# Patient Record
Sex: Male | Born: 1975 | Hispanic: No | Marital: Single
Health system: Southern US, Community
[De-identification: ages and names within clinical notes are randomized; demographics above are authoritative.]

## PROBLEM LIST (undated history)

## (undated) DIAGNOSIS — I10 Essential (primary) hypertension: Secondary | ICD-10-CM

## (undated) DIAGNOSIS — N289 Disorder of kidney and ureter, unspecified: Secondary | ICD-10-CM

## (undated) DIAGNOSIS — N186 End stage renal disease: Secondary | ICD-10-CM

## (undated) DIAGNOSIS — I219 Acute myocardial infarction, unspecified: Secondary | ICD-10-CM

## (undated) DIAGNOSIS — Z992 Dependence on renal dialysis: Secondary | ICD-10-CM

---

## 2002-09-30 ENCOUNTER — Ambulatory Visit (HOSPITAL_COMMUNITY): Admission: RE | Admit: 2002-09-30 | Discharge: 2002-09-30 | Payer: Self-pay | Admitting: Cardiology

## 2002-10-29 ENCOUNTER — Encounter: Payer: Self-pay | Admitting: Nephrology

## 2002-10-29 ENCOUNTER — Inpatient Hospital Stay (HOSPITAL_COMMUNITY): Admission: EM | Admit: 2002-10-29 | Discharge: 2002-10-31 | Payer: Self-pay | Admitting: Nephrology

## 2002-10-30 ENCOUNTER — Encounter: Payer: Self-pay | Admitting: Nephrology

## 2004-03-02 ENCOUNTER — Ambulatory Visit: Payer: Self-pay | Admitting: Nephrology

## 2005-09-03 ENCOUNTER — Ambulatory Visit: Payer: Self-pay | Admitting: Nephrology

## 2008-08-14 ENCOUNTER — Ambulatory Visit: Payer: Self-pay

## 2014-07-24 ENCOUNTER — Other Ambulatory Visit
Admit: 2014-07-24 | Disposition: A | Discharge status: Home / Self Care | Payer: Self-pay | Attending: Nephrology | Admitting: Nephrology

## 2014-07-24 LAB — POTASSIUM: POTASSIUM: 3.6 mmol/L

## 2014-10-15 DIAGNOSIS — I10 Essential (primary) hypertension: Secondary | ICD-10-CM | POA: Insufficient documentation

## 2015-06-20 DIAGNOSIS — R7881 Bacteremia: Secondary | ICD-10-CM | POA: Insufficient documentation

## 2015-06-21 DIAGNOSIS — N186 End stage renal disease: Secondary | ICD-10-CM | POA: Insufficient documentation

## 2015-08-05 DIAGNOSIS — Z09 Encounter for follow-up examination after completed treatment for conditions other than malignant neoplasm: Secondary | ICD-10-CM | POA: Insufficient documentation

## 2016-02-27 ENCOUNTER — Other Ambulatory Visit
Admission: RE | Admit: 2016-02-27 | Discharge: 2016-02-27 | Disposition: A | Discharge status: Home / Self Care | Payer: Self-pay | Source: Ambulatory Visit | Attending: Internal Medicine | Admitting: Internal Medicine

## 2016-02-27 LAB — POTASSIUM: Potassium: 6.2 mmol/L — ABNORMAL HIGH (ref 3.5–5.1)

## 2016-07-07 ENCOUNTER — Other Ambulatory Visit
Admission: RE | Admit: 2016-07-07 | Discharge: 2016-07-07 | Disposition: A | Discharge status: Home / Self Care | Payer: Self-pay | Source: Other Acute Inpatient Hospital | Attending: Nephrology | Admitting: Nephrology

## 2016-07-07 DIAGNOSIS — N186 End stage renal disease: Secondary | ICD-10-CM | POA: Insufficient documentation

## 2016-07-07 DIAGNOSIS — E875 Hyperkalemia: Secondary | ICD-10-CM | POA: Insufficient documentation

## 2016-07-07 LAB — POTASSIUM: Potassium: 6 mmol/L — ABNORMAL HIGH (ref 3.5–5.1)

## 2016-12-24 ENCOUNTER — Other Ambulatory Visit
Admission: RE | Admit: 2016-12-24 | Discharge: 2016-12-24 | Disposition: A | Discharge status: Home / Self Care | Payer: Self-pay | Source: Other Acute Inpatient Hospital | Attending: Nephrology | Admitting: Nephrology

## 2016-12-24 LAB — HEMOGLOBIN: Hemoglobin: 16.4 g/dL (ref 13.0–18.0)

## 2018-08-30 ENCOUNTER — Inpatient Hospital Stay
Admit: 2018-08-30 | Discharge: 2018-08-30 | Disposition: A | Discharge status: Home / Self Care | Payer: Self-pay | Attending: Cardiology | Admitting: Cardiology

## 2018-08-30 ENCOUNTER — Encounter
Admission: EM | Disposition: A | Discharge status: Home / Self Care | Payer: Self-pay | Source: Home / Self Care | Attending: Internal Medicine

## 2018-08-30 ENCOUNTER — Emergency Department: Payer: Self-pay

## 2018-08-30 ENCOUNTER — Inpatient Hospital Stay
Admission: EM | Admit: 2018-08-30 | Discharge: 2018-09-01 | DRG: 280 | Disposition: A | Discharge status: Home / Self Care | Payer: Self-pay | Attending: Internal Medicine | Admitting: Internal Medicine

## 2018-08-30 DIAGNOSIS — Z1159 Encounter for screening for other viral diseases: Secondary | ICD-10-CM

## 2018-08-30 DIAGNOSIS — Z8249 Family history of ischemic heart disease and other diseases of the circulatory system: Secondary | ICD-10-CM

## 2018-08-30 DIAGNOSIS — Z992 Dependence on renal dialysis: Secondary | ICD-10-CM

## 2018-08-30 DIAGNOSIS — Z9119 Patient's noncompliance with other medical treatment and regimen: Secondary | ICD-10-CM

## 2018-08-30 DIAGNOSIS — I5022 Chronic systolic (congestive) heart failure: Secondary | ICD-10-CM | POA: Diagnosis present

## 2018-08-30 DIAGNOSIS — I2119 ST elevation (STEMI) myocardial infarction involving other coronary artery of inferior wall: Principal | ICD-10-CM | POA: Diagnosis present

## 2018-08-30 DIAGNOSIS — D631 Anemia in chronic kidney disease: Secondary | ICD-10-CM | POA: Diagnosis present

## 2018-08-30 DIAGNOSIS — I2510 Atherosclerotic heart disease of native coronary artery without angina pectoris: Secondary | ICD-10-CM | POA: Diagnosis present

## 2018-08-30 DIAGNOSIS — I132 Hypertensive heart and chronic kidney disease with heart failure and with stage 5 chronic kidney disease, or end stage renal disease: Secondary | ICD-10-CM | POA: Diagnosis present

## 2018-08-30 DIAGNOSIS — N186 End stage renal disease: Secondary | ICD-10-CM | POA: Diagnosis present

## 2018-08-30 DIAGNOSIS — I255 Ischemic cardiomyopathy: Secondary | ICD-10-CM | POA: Diagnosis present

## 2018-08-30 DIAGNOSIS — N2581 Secondary hyperparathyroidism of renal origin: Secondary | ICD-10-CM | POA: Diagnosis present

## 2018-08-30 HISTORY — DX: Disorder of kidney and ureter, unspecified: N28.9

## 2018-08-30 HISTORY — PX: CORONARY/GRAFT ACUTE MI REVASCULARIZATION: CATH118305

## 2018-08-30 HISTORY — DX: Essential (primary) hypertension: I10

## 2018-08-30 HISTORY — PX: LEFT HEART CATH AND CORONARY ANGIOGRAPHY: CATH118249

## 2018-08-30 LAB — BASIC METABOLIC PANEL
Anion gap: 18 — ABNORMAL HIGH (ref 5–15)
BUN: 72 mg/dL — ABNORMAL HIGH (ref 6–20)
CO2: 26 mmol/L (ref 22–32)
Calcium: 8 mg/dL — ABNORMAL LOW (ref 8.9–10.3)
Chloride: 94 mmol/L — ABNORMAL LOW (ref 98–111)
Creatinine, Ser: 14.61 mg/dL — ABNORMAL HIGH (ref 0.61–1.24)
GFR calc Af Amer: 4 mL/min — ABNORMAL LOW (ref >60)
GFR calc non Af Amer: 4 mL/min — ABNORMAL LOW (ref >60)
Glucose, Bld: 156 mg/dL — ABNORMAL HIGH (ref 70–99)
Potassium: 4.7 mmol/L (ref 3.5–5.1)
Sodium: 138 mmol/L (ref 135–145)

## 2018-08-30 LAB — CBC
HCT: 47.4 % (ref 39.0–52.0)
Hemoglobin: 15.6 g/dL (ref 13.0–17.0)
MCH: 30.1 pg (ref 26.0–34.0)
MCHC: 32.9 g/dL (ref 30.0–36.0)
MCV: 91.5 fL (ref 80.0–100.0)
Platelets: 152 10*3/uL (ref 150–400)
RBC: 5.18 MIL/uL (ref 4.22–5.81)
RDW: 13.9 % (ref 11.5–15.5)
WBC: 7.4 10*3/uL (ref 4.0–10.5)
nRBC: 0 % (ref 0.0–0.2)

## 2018-08-30 LAB — GLUCOSE, CAPILLARY: Glucose-Capillary: 84 mg/dL (ref 70–99)

## 2018-08-30 LAB — POCT ACTIVATED CLOTTING TIME: Activated Clotting Time: 362 seconds

## 2018-08-30 LAB — MRSA PCR SCREENING: MRSA by PCR: NEGATIVE

## 2018-08-30 LAB — TROPONIN I
Troponin I: 12.74 ng/mL (ref <0.03)
Troponin I: 15.74 ng/mL (ref <0.03)
Troponin I: 17.4 ng/mL (ref <0.03)

## 2018-08-30 LAB — PHOSPHORUS: Phosphorus: 4.5 mg/dL (ref 2.5–4.6)

## 2018-08-30 LAB — SARS CORONAVIRUS 2 BY RT PCR (HOSPITAL ORDER, PERFORMED IN ~~LOC~~ HOSPITAL LAB): SARS Coronavirus 2: NEGATIVE

## 2018-08-30 LAB — HEMOGLOBIN A1C
Hgb A1c MFr Bld: 5.8 % — ABNORMAL HIGH (ref 4.8–5.6)
Mean Plasma Glucose: 119.76 mg/dL

## 2018-08-30 SURGERY — CORONARY/GRAFT ACUTE MI REVASCULARIZATION
Anesthesia: Moderate Sedation

## 2018-08-30 MED ORDER — FENTANYL CITRATE (PF) 100 MCG/2ML IJ SOLN
INTRAMUSCULAR | Status: AC
  Filled 2018-08-30: qty 2

## 2018-08-30 MED ORDER — HEPARIN SODIUM (PORCINE) 1000 UNIT/ML IJ SOLN
INTRAMUSCULAR | Status: DC | PRN
  Administered 2018-08-30: 7000 [IU] via INTRAVENOUS

## 2018-08-30 MED ORDER — HYDRALAZINE HCL 20 MG/ML IJ SOLN
10.0000 mg | INTRAMUSCULAR | Status: DC | PRN
  Administered 2018-08-30: 10:00:00 10 mg via INTRAVENOUS
  Filled 2018-08-30: qty 1

## 2018-08-30 MED ORDER — PENTAFLUOROPROP-TETRAFLUOROETH EX AERO
1.0000 "application " | INHALATION_SPRAY | CUTANEOUS | Status: DC | PRN
  Filled 2018-08-30: qty 30

## 2018-08-30 MED ORDER — SODIUM CHLORIDE 0.9 % IV SOLN: 250.0000 mL | INTRAVENOUS | Status: DC | PRN

## 2018-08-30 MED ORDER — METOPROLOL TARTRATE 50 MG PO TABS
50.0000 mg | ORAL_TABLET | Freq: Two times a day (BID) | ORAL | Status: DC
  Administered 2018-08-30 – 2018-08-31 (×4): 50 mg via ORAL
  Filled 2018-08-30 (×5): qty 1

## 2018-08-30 MED ORDER — HEPARIN SODIUM (PORCINE) 1000 UNIT/ML DIALYSIS
1000.0000 [IU] | INTRAMUSCULAR | Status: DC | PRN
  Filled 2018-08-30: qty 1

## 2018-08-30 MED ORDER — ALTEPLASE 2 MG IJ SOLR: 2.0000 mg | Freq: Once | INTRAMUSCULAR | Status: DC | PRN

## 2018-08-30 MED ORDER — ONDANSETRON HCL 4 MG/2ML IJ SOLN: 4.0000 mg | Freq: Four times a day (QID) | INTRAMUSCULAR | Status: DC | PRN

## 2018-08-30 MED ORDER — METOPROLOL TARTRATE 5 MG/5ML IV SOLN
INTRAVENOUS | Status: DC | PRN
  Administered 2018-08-30 (×2): 2.5 mg via INTRAVENOUS

## 2018-08-30 MED ORDER — ASPIRIN 81 MG PO CHEW: 324.0000 mg | CHEWABLE_TABLET | Freq: Once | ORAL | Status: DC

## 2018-08-30 MED ORDER — VERAPAMIL HCL 2.5 MG/ML IV SOLN
INTRAVENOUS | Status: DC | PRN
  Administered 2018-08-30: 2.5 mg via INTRA_ARTERIAL

## 2018-08-30 MED ORDER — ASPIRIN 81 MG PO CHEW
81.0000 mg | CHEWABLE_TABLET | Freq: Every day | ORAL | Status: DC
  Administered 2018-08-30 – 2018-08-31 (×2): 81 mg via ORAL
  Filled 2018-08-30 (×3): qty 1

## 2018-08-30 MED ORDER — NITROGLYCERIN 5 MG/ML IV SOLN
INTRAVENOUS | Status: AC
  Filled 2018-08-30: qty 10

## 2018-08-30 MED ORDER — CHLORHEXIDINE GLUCONATE CLOTH 2 % EX PADS
6.0000 | MEDICATED_PAD | Freq: Every day | CUTANEOUS | Status: DC
  Administered 2018-08-30 – 2018-08-31 (×2): 6 via TOPICAL

## 2018-08-30 MED ORDER — SODIUM CHLORIDE 0.9% FLUSH: 3.0000 mL | INTRAVENOUS | Status: DC | PRN

## 2018-08-30 MED ORDER — MIDAZOLAM HCL 5 MG/5ML IJ SOLN
INTRAMUSCULAR | Status: AC
  Filled 2018-08-30: qty 5

## 2018-08-30 MED ORDER — FENTANYL CITRATE (PF) 100 MCG/2ML IJ SOLN
INTRAMUSCULAR | Status: DC | PRN
  Administered 2018-08-30 (×2): 25 ug via INTRAVENOUS

## 2018-08-30 MED ORDER — SODIUM CHLORIDE 0.9% FLUSH
3.0000 mL | Freq: Two times a day (BID) | INTRAVENOUS | Status: DC
  Administered 2018-08-30 – 2018-08-31 (×3): 3 mL via INTRAVENOUS

## 2018-08-30 MED ORDER — LISINOPRIL 5 MG PO TABS
5.0000 mg | ORAL_TABLET | Freq: Every day | ORAL | Status: DC
  Administered 2018-08-30: 5 mg via ORAL
  Filled 2018-08-30: qty 1

## 2018-08-30 MED ORDER — TICAGRELOR 90 MG PO TABS
ORAL_TABLET | ORAL | Status: DC | PRN
  Administered 2018-08-30: 180 mg via ORAL

## 2018-08-30 MED ORDER — ATORVASTATIN CALCIUM 20 MG PO TABS
80.0000 mg | ORAL_TABLET | Freq: Every day | ORAL | Status: DC
  Administered 2018-08-30 – 2018-08-31 (×2): 80 mg via ORAL
  Filled 2018-08-30: qty 1
  Filled 2018-08-30: qty 4
  Filled 2018-08-30: qty 1
  Filled 2018-08-30: qty 4

## 2018-08-30 MED ORDER — HEPARIN SODIUM (PORCINE) 5000 UNIT/ML IJ SOLN
5000.0000 [IU] | Freq: Three times a day (TID) | INTRAMUSCULAR | Status: DC
  Administered 2018-08-30 – 2018-09-01 (×6): 5000 [IU] via SUBCUTANEOUS
  Filled 2018-08-30 (×6): qty 1

## 2018-08-30 MED ORDER — METOPROLOL TARTRATE 5 MG/5ML IV SOLN
INTRAVENOUS | Status: AC
  Filled 2018-08-30: qty 5

## 2018-08-30 MED ORDER — TICAGRELOR 90 MG PO TABS
90.0000 mg | ORAL_TABLET | Freq: Two times a day (BID) | ORAL | Status: DC
  Administered 2018-08-30 – 2018-08-31 (×3): 90 mg via ORAL
  Filled 2018-08-30 (×3): qty 1

## 2018-08-30 MED ORDER — IOHEXOL 300 MG/ML  SOLN
INTRAMUSCULAR | Status: DC | PRN
  Administered 2018-08-30: 200 mL via INTRAVENOUS

## 2018-08-30 MED ORDER — NITROGLYCERIN IN D5W 200-5 MCG/ML-% IV SOLN: 0.0000 ug/min | INTRAVENOUS | Status: DC

## 2018-08-30 MED ORDER — SODIUM CHLORIDE 0.9 % IV SOLN: 100.0000 mL | INTRAVENOUS | Status: DC | PRN

## 2018-08-30 MED ORDER — LIDOCAINE-PRILOCAINE 2.5-2.5 % EX CREA
1.0000 "application " | TOPICAL_CREAM | CUTANEOUS | Status: DC | PRN
  Filled 2018-08-30: qty 5

## 2018-08-30 MED ORDER — VERAPAMIL HCL 2.5 MG/ML IV SOLN
INTRAVENOUS | Status: AC
  Filled 2018-08-30: qty 2

## 2018-08-30 MED ORDER — ACETAMINOPHEN 325 MG PO TABS: 650.0000 mg | ORAL_TABLET | ORAL | Status: DC | PRN

## 2018-08-30 MED ORDER — LIDOCAINE HCL (PF) 1 % IJ SOLN
5.0000 mL | INTRAMUSCULAR | Status: DC | PRN
  Filled 2018-08-30: qty 5

## 2018-08-30 MED ORDER — MIDAZOLAM HCL 2 MG/2ML IJ SOLN
INTRAMUSCULAR | Status: DC | PRN
  Administered 2018-08-30 (×2): 1 mg via INTRAVENOUS

## 2018-08-30 MED ORDER — SODIUM CHLORIDE 0.9 % WEIGHT BASED INFUSION: 1.0000 mL/kg/h | INTRAVENOUS | Status: AC

## 2018-08-30 MED ORDER — LABETALOL HCL 5 MG/ML IV SOLN: 10.0000 mg | INTRAVENOUS | Status: DC | PRN

## 2018-08-30 SURGICAL SUPPLY — 16 items
CATH INFINITI 5FR ANG PIGTAIL (CATHETERS) ×3 IMPLANT
CATH INFINITI 5FR JL4 (CATHETERS) ×3 IMPLANT
CATH LAUNCHER 6FR JR4 (CATHETERS) ×3 IMPLANT
DEVICE INFLAT 30 PLUS (MISCELLANEOUS) ×3 IMPLANT
DEVICE RAD TR BAND REGULAR (VASCULAR PRODUCTS) ×3 IMPLANT
GLIDESHEATH SLEND SS 6F .021 (SHEATH) ×3 IMPLANT
KIT MANI 3VAL PERCEP (MISCELLANEOUS) ×3 IMPLANT
NEEDLE PERC 18GX7CM (NEEDLE) IMPLANT
PACK CARDIAC CATH (CUSTOM PROCEDURE TRAY) ×3 IMPLANT
PROTECTION STATION PRESSURIZED (MISCELLANEOUS) ×3
SHEATH AVANTI 6FR X 11CM (SHEATH) IMPLANT
STATION PROTECTION PRESSURIZED (MISCELLANEOUS) ×1 IMPLANT
WIRE ASAHI PROWATER 180CM (WIRE) ×3 IMPLANT
WIRE GUIDERIGHT .035X150 (WIRE) IMPLANT
WIRE HITORQ VERSACORE ST 145CM (WIRE) ×3 IMPLANT
WIRE ROSEN-J .035X260CM (WIRE) ×3 IMPLANT

## 2018-08-30 NOTE — Progress Notes (Signed)
HD Tx Start    08/30/18 1100  Vital Signs  Pulse Rate 70  Resp (!) 26  BP (!) 155/98  Oxygen Therapy  SpO2 98 %  During Hemodialysis Assessment  Blood Flow Rate (mL/min) 200 mL/min  Arterial Pressure (mmHg) -40 mmHg  Venous Pressure (mmHg) 80 mmHg  Transmembrane Pressure (mmHg) 50 mmHg  Ultrafiltration Rate (mL/min) 620 mL/min (658mL removal per HOUR)  Dialysate Flow Rate (mL/min) 800 ml/min  Conductivity: Machine  14  HD Safety Checks Performed Yes  Dialysis Fluid Bolus Normal Saline  Bolus Amount (mL) 250 mL  Intra-Hemodialysis Comments Tx initiated (BFR increase slowly d/t EF(30-35%)&recent STEMI)

## 2018-08-30 NOTE — Progress Notes (Signed)
Notified Amanda Morrison, Dialysis liaison with patient pathways of admission.   

## 2018-08-30 NOTE — ED Notes (Signed)
Dr. Saralyn Pilar at bedside along with interpreter

## 2018-08-30 NOTE — ED Triage Notes (Signed)
Patient 43 yo male coming from dialysis by [facility] complaining of chest pain x 3 days. Patient is spanish speaking with little english. Patient states having chest pain x 3 days that feels like pressure with numbness going down left arm. Patient was hypertensive with EMS BP 160/100 HR 90. Per EMS gave chewable aspirin in route 324 mg.

## 2018-08-30 NOTE — Progress Notes (Signed)
*  PRELIMINARY RESULTS* Echocardiogram 2D Echocardiogram has been performed.  Sherrie Sport 08/30/2018, 2:40 PM

## 2018-08-30 NOTE — Progress Notes (Signed)
Established patient at [facility] 5:45am , no changes in patient chair time at this moment. Continue to follow.

## 2018-08-30 NOTE — Consult Note (Signed)
[facility] CONSULT NOTE    Date: 08/30/2018                  Patient Name:  Ralph Lucero  MRN: [id number]  DOB: 1976/02/05  Age / Sex: 43 y.o., male         PCP: Anthonette Legato, MD                 Service Requesting Consult:  Hospitalist                 Reason for Consult:  Management of end-stage renal disease.            History of Present Illness: Patient is a 43 y.o. male with a PMHx of end-stage renal disease on hemodialysis, hypertension, anemia of chronic kidney disease, secondary hyperparathyroidism, who was admitted to [facility] on 08/30/2018 for evaluation of chest pain.  Upon being brought here he was found to have ischemic changes in the inferolateral leads.  His initial troponin was 12.  He was urgently taken to the cardiac catheterization lab.  He was found to have a 100% stenosis posterior descending artery.  We saw the patient in the intensive care unit.  His chest pain was improving at that time.  Patient due for hemodialysis today per his usual schedule.  He is also found to have significant ischemic cardiomyopathy with an ejection fraction of 30 to 35%.   Medications: Outpatient medications: No medications prior to admission.    Current medications: Current Facility-Administered Medications  Medication Dose Route Frequency Provider Last Rate Last Dose  . 0.9 %  sodium chloride infusion  250 mL Intravenous PRN Paraschos, Alexander, MD      . 0.9 %  sodium chloride infusion  100 mL Intravenous PRN Juel Ripley, MD      . 0.9 %  sodium chloride infusion  100 mL Intravenous PRN Shyvonne Chastang, MD      . 0.9% sodium chloride infusion  1 mL/kg/hr Intravenous Continuous Paraschos, Alexander, MD      . acetaminophen (TYLENOL) tablet 650 mg  650 mg Oral Q4H PRN Paraschos, Alexander, MD      . alteplase (CATHFLO ACTIVASE) injection 2 mg  2 mg Intracatheter Once PRN Seven Marengo, MD      . aspirin chewable tablet 324 mg  324 mg Oral Once Gregor Hams, MD      . aspirin chewable tablet 81 mg  81 mg Oral Daily Paraschos, Alexander, MD   81 mg at 08/30/18 1000  . atorvastatin (LIPITOR) tablet 80 mg  80 mg Oral q1800 Paraschos, Alexander, MD      . Chlorhexidine Gluconate Cloth 2 % PADS 6 each  6 each Topical Q0600 Holley Raring, Mahlani Berninger, MD   6 each at 08/30/18 1000  . fentaNYL (SUBLIMAZE) 100 MCG/2ML injection           . heparin injection 1,000 Units  1,000 Units Dialysis PRN Sharyn Brilliant, MD      . heparin injection 5,000 Units  5,000 Units Subcutaneous Q8H Kasa, Kurian, MD      . hydrALAZINE (APRESOLINE) injection 10 mg  10 mg Intravenous Q20 Min PRN Paraschos, Alexander, MD   10 mg at 08/30/18 1000  . labetalol (NORMODYNE) injection 10 mg  10 mg Intravenous Q10 min PRN Paraschos, Alexander, MD      . lidocaine (PF) (XYLOCAINE) 1 % injection 5 mL  5 mL Intradermal PRN Holley Raring, Agustina Witzke, MD      .  lidocaine-prilocaine (EMLA) cream 1 application  1 application Topical PRN Jamonte Curfman, MD      . lisinopril (ZESTRIL) tablet 5 mg  5 mg Oral Daily Paraschos, Alexander, MD   5 mg at 08/30/18 1000  . metoprolol tartrate (LOPRESSOR) 5 MG/5ML injection           . metoprolol tartrate (LOPRESSOR) tablet 50 mg  50 mg Oral BID Paraschos, Alexander, MD   50 mg at 08/30/18 1000  . midazolam (VERSED) 5 MG/5ML injection           . nitroGLYCERIN 5 MG/ML injection           . nitroGLYCERIN 50 mg in dextrose 5 % 250 mL (0.2 mg/mL) infusion  0-200 mcg/min Intravenous Titrated Paraschos, Alexander, MD      . ondansetron (ZOFRAN) injection 4 mg  4 mg Intravenous Q6H PRN Paraschos, Alexander, MD      . pentafluoroprop-tetrafluoroeth (GEBAUERS) aerosol 1 application  1 application Topical PRN Aleric Froelich, MD      . sodium chloride flush (NS) 0.9 % injection 3 mL  3 mL Intravenous Q12H Paraschos, Alexander, MD      . sodium chloride flush (NS) 0.9 % injection 3 mL  3 mL Intravenous PRN Paraschos, Alexander, MD      . ticagrelor (BRILINTA) tablet 90 mg   90 mg Oral BID Paraschos, Alexander, MD      . verapamil (ISOPTIN) 2.5 MG/ML injection               Allergies: Not on File    Past Medical History: Past Medical History:  Diagnosis Date  . Hypertension   . Renal disorder      Past Surgical History: Past Surgical History:  Procedure Laterality Date  . CORONARY/GRAFT ACUTE MI REVASCULARIZATION N/A 08/30/2018   Procedure: Coronary/Graft Acute MI Revascularization;  Surgeon: Isaias Cowman, MD;  Location: [place] [facility];  Service: Cardiovascular;  Laterality: N/A;  . LEFT HEART CATH AND CORONARY ANGIOGRAPHY N/A 08/30/2018   Procedure: LEFT HEART CATH AND CORONARY ANGIOGRAPHY;  Surgeon: Isaias Cowman, MD;  Location: [place] [facility];  Service: Cardiovascular;  Laterality: N/A;     Family History: History reviewed. No pertinent family history.   Social History: Social History   Socioeconomic History  . Marital status: Single    Spouse name: Not on file  . Number of children: Not on file  . Years of education: Not on file  . Highest education level: Not on file  Occupational History  . Not on file  Social Needs  . Financial resource strain: Not on file  . Food insecurity:    Worry: Not on file    Inability: Not on file  . Transportation needs:    Medical: Not on file    Non-medical: Not on file  Tobacco Use  . Smoking status: Never Smoker  . Smokeless tobacco: Never Used  Substance and Sexual Activity  . Alcohol use: Never    Frequency: Never  . Drug use: Never  . Sexual activity: Not on file  Lifestyle  . Physical activity:    Days per week: Not on file    Minutes per session: Not on file  . Stress: Not on file  Relationships  . Social connections:    Talks on phone: Not on file    Gets together: Not on file    Attends religious service: Not on file    Active member of club or organization: Not on file  Attends meetings of clubs or organizations: Not on file    Relationship  status: Not on file  . Intimate partner violence:    Fear of current or ex partner: Not on file    Emotionally abused: Not on file    Physically abused: Not on file    Forced sexual activity: Not on file  Other Topics Concern  . Not on file  Social History Narrative  . Not on file     Review of Systems: Review of Systems  Constitutional: Negative for chills, fever and malaise/fatigue.  HENT: Negative for congestion, hearing loss and tinnitus.   Eyes: Negative for blurred vision and double vision.  Respiratory: Negative for cough, hemoptysis and sputum production.   Cardiovascular: Positive for chest pain. Negative for orthopnea and leg swelling.  Gastrointestinal: Negative for heartburn, nausea and vomiting.  Genitourinary: Negative for dysuria and urgency.  Musculoskeletal: Negative for myalgias and neck pain.  Skin: Negative for itching and rash.  Neurological: Negative for dizziness and weakness.  Endo/Heme/Allergies: Negative for polydipsia. Does not bruise/bleed easily.  Psychiatric/Behavioral: Negative for depression. The patient is not nervous/anxious.      Vital Signs: Blood pressure (!) 151/97, pulse 72, temperature 98.2 F (36.8 C), temperature source Oral, resp. rate 17, height 5\' 9"  (1.753 m), weight 54.5 kg, SpO2 99 %.  Weight trends: Filed Weights   08/30/18 0658 08/30/18 1045 08/30/18 1430  Weight: 56 kg 56 kg 54.5 kg    Physical Exam: General: NAD, resting comfortably in bed  Head: Normocephalic, atraumatic.  Eyes: Anicteric, EOMI  Nose: Mucous membranes moist, not inflammed, nonerythematous.  Throat: Oropharynx nonerythematous, no exudate appreciated.   Neck: Supple, trachea midline.  Lungs:  Normal respiratory effort. Clear to auscultation BL without crackles or wheezes.  Heart: RRR. S1 and S2 normal without gallop, murmur, or rubs.  Abdomen:  BS normoactive. Soft, Nondistended, non-tender.  No masses or organomegaly.  Extremities: No pretibial  edema.  Neurologic: A&O X3, Motor strength is 5/5 in the all 4 extremities  Skin: No visible rashes, scars.    Lab results: Basic Metabolic Panel: Recent Labs  Lab 08/30/18 0701 08/30/18 1120  NA 138  --   K 4.7  --   CL 94*  --   CO2 26  --   GLUCOSE 156*  --   BUN 72*  --   CREATININE 14.61*  --   CALCIUM 8.0*  --   PHOS  --  4.5    Liver Function Tests: No results for input(s): AST, ALT, ALKPHOS, BILITOT, PROT, ALBUMIN in the last 168 hours. No results for input(s): LIPASE, AMYLASE in the last 168 hours. No results for input(s): AMMONIA in the last 168 hours.  CBC: Recent Labs  Lab 08/30/18 0701  WBC 7.4  HGB 15.6  HCT 47.4  MCV 91.5  PLT 152    Cardiac Enzymes: Recent Labs  Lab 08/30/18 0701 08/30/18 1145  TROPONINI 12.74* 15.74*    BNP: Invalid input(s): POCBNP  CBG: Recent Labs  Lab 08/30/18 0920  GLUCAP 66    Microbiology: Results for orders placed or performed during the hospital encounter of 08/30/18  SARS Coronavirus 2 (CEPHEID - Performed in [place] hospital lab), Hosp Order     Status: None   Collection Time: 08/30/18  7:02 AM  Result Value Ref Range Status   SARS Coronavirus 2 NEGATIVE NEGATIVE Final    Comment: (NOTE) If result is NEGATIVE SARS-CoV-2 target nucleic acids are NOT DETECTED. The SARS-CoV-2 RNA  is generally detectable in upper and lower  respiratory specimens during the acute phase of infection. The lowest  concentration of SARS-CoV-2 viral copies this assay can detect is 250  copies / mL. A negative result does not preclude SARS-CoV-2 infection  and should not be used as the sole basis for treatment or other  patient management decisions.  A negative result may occur with  improper specimen collection / handling, submission of specimen other  than nasopharyngeal swab, presence of viral mutation(s) within the  areas targeted by this assay, and inadequate number of viral copies  (<250 copies / mL). A negative  result must be combined with clinical  observations, patient history, and epidemiological information. If result is POSITIVE SARS-CoV-2 target nucleic acids are DETECTED. The SARS-CoV-2 RNA is generally detectable in upper and lower  respiratory specimens dur ing the acute phase of infection.  Positive  results are indicative of active infection with SARS-CoV-2.  Clinical  correlation with patient history and other diagnostic information is  necessary to determine patient infection status.  Positive results do  not rule out bacterial infection or co-infection with other viruses. If result is PRESUMPTIVE POSTIVE SARS-CoV-2 nucleic acids MAY BE PRESENT.   A presumptive positive result was obtained on the submitted specimen  and confirmed on repeat testing.  While 2019 novel coronavirus  (SARS-CoV-2) nucleic acids may be present in the submitted sample  additional confirmatory testing may be necessary for epidemiological  and / or clinical management purposes  to differentiate between  SARS-CoV-2 and other Sarbecovirus currently known to infect humans.  If clinically indicated additional testing with an alternate test  methodology [phone]) is advised. The SARS-CoV-2 RNA is generally  detectable in upper and lower respiratory sp ecimens during the acute  phase of infection. The expected result is Negative. Fact Sheet for Patients:  StrictlyIdeas.no Fact Sheet for Healthcare Providers: BankingDealers.co.za This test is not yet approved or cleared by the [place] FDA and has been authorized for detection and/or diagnosis of SARS-CoV-2 by FDA under an Emergency Use Authorization (EUA).  This EUA will remain in effect (meaning this test can be used) for the duration of the COVID-19 declaration under Section 564(b)(1) of the Act, 21 U.S.C. section 360bbb-3(b)(1), unless the authorization is terminated or revoked sooner. Performed at  [facility], [address] [place], [place] [address]   MRSA PCR Screening     Status: None   Collection Time: 08/30/18  9:34 AM  Result Value Ref Range Status   MRSA by PCR NEGATIVE NEGATIVE Final    Comment:        The GeneXpert MRSA Assay (FDA approved for NASAL specimens only), is one component of a comprehensive MRSA colonization surveillance program. It is not intended to diagnose MRSA infection nor to guide or monitor treatment for MRSA infections. Performed at [facility], [address] [place], [place] [address]     Coagulation Studies: No results for input(s): LABPROT, INR in the last 72 hours.  Urinalysis: No results for input(s): COLORURINE, LABSPEC, PHURINE, GLUCOSEU, HGBUR, BILIRUBINUR, KETONESUR, PROTEINUR, UROBILINOGEN, NITRITE, LEUKOCYTESUR in the last 72 hours.  Invalid input(s): APPERANCEUR    Imaging: Dg Chest Port 1 View  Result Date: 08/30/2018 CLINICAL DATA:  Chest pain EXAM: PORTABLE CHEST 1 VIEW COMPARISON:  None. FINDINGS: Cardiac shadow is mildly enlarged. The lungs are well aerated bilaterally. No focal infiltrate or sizable effusion is seen. No acute bony abnormality is noted. IMPRESSION: No active disease. Electronically Signed  By: Inez Catalina M.D.   On: 08/30/2018 07:13      Assessment & Plan: Pt is a 43 y.o. male with a PMHx of end-stage renal disease on hemodialysis, hypertension, anemia of chronic kidney disease, secondary hyperparathyroidism, who was admitted to [facility] on 08/30/2018 for evaluation of chest pain.  Patient found to have inferolateral myocardial infarction and PCI was attempted however unsuccessful.  Suspected lesion was in the mid posterior descending artery.  1.  ESRD on HD TTS.  Patient due for hemodialysis today.  We will plan for conservative ultrafiltration target of 1.5 kg.  2.  Coronary artery disease with lesion in mid posterior descending artery unamenable to PCI/chronic systolic heart  failure ejection fraction 30 to 35%.  PCI attempted but unsuccessful.  Cardiology recommends medical management with dual antiplatelet therapy.  3.  Secondary hyperparathyroidism.  Serum phosphorus currently 4.5 and acceptable.  Continue to monitor bone mineral metabolism parameters.  4.  Hypertension.  Maintain the patient on hydralazine, labetalol, for now.  Consider adding an ARB post discharge.  5.  Thanks for consultation.

## 2018-08-30 NOTE — Progress Notes (Signed)
Post HD Tx  Removed 1.5 liters during tx, tolerated well. Currently denies increased chest pain (still 2/10, as it was pre-tx).      08/30/18 1445  Hand-Off documentation  Report given to (Full Name) Leeanne Deed RN  Report received from (Full Name) Trellis Paganini RN  Vital Signs  Pulse Rate 72  Resp 17  BP (!) 151/97  Oxygen Therapy  SpO2 99 %  Post-Hemodialysis Assessment  Rinseback Volume (mL) 250 mL  Dialyzer Clearance Lightly streaked  Duration of HD Treatment -hour(s) 3.5 hour(s)  Hemodialysis Intake (mL) 500 mL  UF Total -Machine (mL) 2000 mL  Net UF (mL) 1500 mL  Tolerated HD Treatment Yes  AVG/AVF Arterial Site Held (minutes) 10 minutes  AVG/AVF Venous Site Held (minutes) 10 minutes

## 2018-08-30 NOTE — Progress Notes (Signed)
Pre HD Assessment    08/30/18 1100  Neurological  Level of Consciousness Alert  Orientation Level Oriented X4  Respiratory  Respiratory Pattern Regular;Irregular  Chest Assessment Chest expansion symmetrical  Bilateral Breath Sounds Diminished  Cardiac  Pulse Regular  ECG Monitor Yes  Cardiac Rhythm NSR  Vascular  R Radial Pulse +2  L Radial Pulse +2  Edema Generalized  Integumentary  Integumentary (WDL) X  Skin Color Appropriate for ethnicity  Skin Condition Dry  Skin Integrity  (scabbing at AVF from cannulation in 1spot.rotate site today)  Additional Integumentary Comments  (HD pt - left lower AVF, aneurisms present)  Musculoskeletal  Musculoskeletal (WDL) WDL  Gastrointestinal  Bowel Sounds Assessment Active  GU Assessment  Genitourinary (WDL) X  Genitourinary Symptoms Anuria (HD pt 11+ years)  Psychosocial  Psychosocial (WDL) WDL

## 2018-08-30 NOTE — Progress Notes (Signed)
HD Tx End    08/30/18 1430  Vital Signs  Temp 98.2 F (36.8 C)  Temp Source Oral  Pulse Rate 67  Pulse Rate Source Monitor  Resp 17  BP (!) 168/99  BP Location Right Leg  BP Method Automatic  Patient Position (if appropriate) Lying  Oxygen Therapy  SpO2 98 %  O2 Device Room Air  Pain Assessment  Pain Scale 0-10  Pain Score 2  Pain Type Acute pain  Pain Location Chest (same as pre tx)  Dialysis Weight  Weight 54.5 kg  Type of Weight Post-Dialysis  Fistula / Graft Left Forearm Arteriovenous fistula  No Placement Date or Time found.   Placed prior to admission: Yes  Orientation: Left  Access Location: Forearm  Access Type: Arteriovenous fistula  Site Condition No complications;Other (Comment)  Fistula / Graft Assessment Present;Thrill;Bruit;Aneurism present  Status Deaccessed  Drainage Description None

## 2018-08-30 NOTE — ED Provider Notes (Signed)
[facility] Emergency Department Provider Note ______________________________   First MD Initiated Contact with Patient 08/30/18 [phone]     (approximate)  I have reviewed the triage vital signs and the nursing notes.   HISTORY  Chief Complaint Chest Pain    HPI Ralph Lucero is a 43 y.o. male with history of hypertension and end-stage renal disease requiring dialysis presents to the emergency department via EMS from dialysis with progressively worsening chest pain which patient states began on Tuesday with maximum discomfort this morning.  Patient states that the pain is on the left side of the chest and radiates to the left arm.  Patient denies any dyspnea.  Patient denies any lower extremity pain or swelling.  Or vomiting.        Past Medical History:  Diagnosis Date  . Hypertension   . Renal disorder     There are no active problems to display for this patient.   History reviewed. No pertinent surgical history.  Prior to Admission medications   Not on File    Allergies Patient has no allergy information on record.  History reviewed. No pertinent family history.  Social History Social History   Tobacco Use  . Smoking status: Never Smoker  . Smokeless tobacco: Never Used  Substance Use Topics  . Alcohol use: Never    Frequency: Never  . Drug use: Never    Review of Systems Constitutional: No fever/chills Eyes: No visual changes. ENT: No sore throat. Cardiovascular: Positive for chest pain. Respiratory: Denies shortness of breath. Gastrointestinal: No abdominal pain.  No nausea, no vomiting.  No diarrhea.  No constipation. Genitourinary: Negative for dysuria. Musculoskeletal: Negative for neck pain.  Negative for back pain. Integumentary: Negative for rash. Neurological: Negative for headaches, focal weakness or numbness.   ____________________________________________   PHYSICAL EXAM:  VITAL SIGNS: ED Triage Vitals   Enc Vitals Group     BP 08/30/18 0651 (!) 170/130     Pulse Rate 08/30/18 0651 85     Resp 08/30/18 0651 18     Temp 08/30/18 0651 98.7 F (37.1 C)     Temp Source 08/30/18 0651 Oral     SpO2 08/30/18 0649 100 %     Weight 08/30/18 0658 56 kg (123 lb 7.3 oz)     Height 08/30/18 0658 1.753 m (5\' 9" )     Head Circumference --      Peak Flow --      Pain Score 08/30/18 0652 2     Pain Loc --      Pain Edu? --      Excl. in [place]? --     Constitutional: Alert and oriented. Well appearing and in no acute distress. Eyes: Conjunctivae are normal. Mouth/Throat: Mucous membranes are moist.  Oropharynx non-erythematous. Neck: No stridor.   Cardiovascular: Normal rate, regular rhythm. Good peripheral circulation. Grossly normal heart sounds. Respiratory: Normal respiratory effort.  No retractions. No audible wheezing. Gastrointestinal: Soft and nontender. No distention.  Musculoskeletal: No lower extremity tenderness nor edema. No gross deformities of extremities. Neurologic:  Normal speech and language. No gross focal neurologic deficits are appreciated.  Skin:  Skin is warm, dry and intact. No rash noted.   ____________________________________________   LABS (all labs ordered are listed, but only abnormal results are displayed)  Labs Reviewed  BASIC METABOLIC PANEL  CBC  TROPONIN I   ____________________________________________  EKG  ED ECG REPORT I, [place], the attending physician, personally viewed and interpreted  this ECG.   Date: 08/30/2018  EKG Time: 6:48 AM  Rate: 84  Rhythm: Normal sinus rhythm  Axis: Normal  Intervals: Normal  ST&T Change: Inferior ST segment elevation to 3 and aVF.  Also concern for posterior given ST segment depression V1 and V2  ____________________________________________   ___________________________________  .Critical Care Performed by: Gregor Hams, MD Authorized by: Gregor Hams, MD   Critical care provider  statement:    Critical care time (minutes):  20   Critical care time was exclusive of:  Separately billable procedures and treating other patients (STEMI)   Critical care was time spent personally by me on the following activities:  Development of treatment plan with patient or surrogate, discussions with consultants, evaluation of patient's response to treatment, examination of patient, obtaining history from patient or surrogate, ordering and performing treatments and interventions, ordering and review of laboratory studies, ordering and review of radiographic studies, pulse oximetry, re-evaluation of patient's condition and review of old charts   I assumed direction of critical care for this patient from another provider in my specialty: no       ____________________________________________   INITIAL IMPRESSION / MDM / [place] / ED COURSE  As part of my medical decision making, I reviewed the following data within the electronic MEDICAL RECORD NUMBER   43 year old male presenting with above-stated history and physical exam concerning for possible area/posterior MI.  STEMI activated patient discussed with Dr. Saralyn Pilar    *[name] was evaluated in Emergency Department on 08/30/2018 for the symptoms described in the history of present illness. He was evaluated in the context of the global COVID-19 pandemic, which necessitated consideration that the patient might be at risk for infection with the SARS-CoV-2 virus that causes COVID-19. Institutional protocols and algorithms that pertain to the evaluation of patients at risk for COVID-19 are in a state of rapid change based on information released by regulatory bodies including the CDC and federal and state organizations. These policies and algorithms were followed during the patient's care in the ED.  Some ED evaluations and interventions may be delayed as a result of limited staffing during the pandemic.*     ____________________________________________  FINAL CLINICAL IMPRESSION(S) / ED DIAGNOSES  Final diagnoses:  Acute ST elevation myocardial infarction (STEMI) involving other coronary artery of inferior wall (HCC)     MEDICATIONS GIVEN DURING THIS VISIT:  Medications - No data to display   ED Discharge Orders    None       Note:  This document was prepared using Dragon voice recognition software and may include unintentional dictation errors.   Gregor Hams, MD 08/30/18 [phone]

## 2018-08-30 NOTE — Consult Note (Signed)
[facility] Cardiology  CARDIOLOGY CONSULT NOTE  Patient ID: Ralph Lucero MRN: [id number] DOB/AGE: [birth year] 43 y.o.  Admit date: 08/30/2018 Referring Physician Owens Shark Primary Physician  Primary Cardiologist  Reason for Consultation inferolateral STEMI  HPI: 43 year old gentleman referred for evaluation of chest pain and probable inferolateral ST elevation myocardial infarction.  Patient developed substernal chest pain on 08/28/2018 rated 5 out of 10.  Pain waxed and waned for 2 days and presented to [facility] ED on 08/30/2018 where he still complained of residual 2 out of 10 chest pain.  ECG revealed inferior and lateral ST elevation with diagnostic Q waves consistent with evolving inferior myocardial infarction.  Initial troponin was 12.74.  Review of systems complete and found to be negative unless listed above     Past Medical History:  Diagnosis Date  . Hypertension   . Renal disorder     History reviewed. No pertinent surgical history.  No medications prior to admission.   Social History   Socioeconomic History  . Marital status: Single    Spouse name: Not on file  . Number of children: Not on file  . Years of education: Not on file  . Highest education level: Not on file  Occupational History  . Not on file  Social Needs  . Financial resource strain: Not on file  . Food insecurity:    Worry: Not on file    Inability: Not on file  . Transportation needs:    Medical: Not on file    Non-medical: Not on file  Tobacco Use  . Smoking status: Never Smoker  . Smokeless tobacco: Never Used  Substance and Sexual Activity  . Alcohol use: Never    Frequency: Never  . Drug use: Never  . Sexual activity: Not on file  Lifestyle  . Physical activity:    Days per week: Not on file    Minutes per session: Not on file  . Stress: Not on file  Relationships  . Social connections:    Talks on phone: Not on file    Gets together: Not on file    Attends religious service: Not on file   Active member of club or organization: Not on file    Attends meetings of clubs or organizations: Not on file    Relationship status: Not on file  . Intimate partner violence:    Fear of current or ex partner: Not on file    Emotionally abused: Not on file    Physically abused: Not on file    Forced sexual activity: Not on file  Other Topics Concern  . Not on file  Social History Narrative  . Not on file    History reviewed. No pertinent family history.    Review of systems complete and found to be negative unless listed above      PHYSICAL EXAM  General: Well developed, well nourished, in no acute distress HEENT:  Normocephalic and atramatic Neck:  No JVD.  Lungs: Clear bilaterally to auscultation and percussion. Heart: HRRR . Normal S1 and S2 without gallops or murmurs.  Abdomen: Bowel sounds are positive, abdomen soft and non-tender  Msk:  Back normal, normal gait. Normal strength and tone for age. Extremities: No clubbing, cyanosis or edema.   Neuro: Alert and oriented X 3. Psych:  Good affect, responds appropriately  Labs:   Lab Results  Component Value Date   WBC 7.4 08/30/2018   HGB 15.6 08/30/2018   HCT 47.4 08/30/2018   MCV 91.5 08/30/2018  PLT 152 08/30/2018    Recent Labs  Lab 08/30/18 0701  NA 138  K 4.7  CL 94*  CO2 26  BUN 72*  CREATININE 14.61*  CALCIUM 8.0*  GLUCOSE 156*   Lab Results  Component Value Date   TROPONINI 12.74 ([employer] [place]) 08/30/2018   No results found for: CHOL No results found for: HDL No results found for: LDLCALC No results found for: TRIG No results found for: CHOLHDL No results found for: LDLDIRECT    Radiology: Dg Chest Port 1 View  Result Date: 08/30/2018 CLINICAL DATA:  Chest pain EXAM: PORTABLE CHEST 1 VIEW COMPARISON:  None. FINDINGS: Cardiac shadow is mildly enlarged. The lungs are well aerated bilaterally. No focal infiltrate or sizable effusion is seen. No acute bony abnormality is noted. IMPRESSION: No active  disease. Electronically Signed   By: Inez Catalina M.D.   On: 08/30/2018 07:13    EKG: Sinus rhythm with inferolateral ST elevation  ASSESSMENT AND PLAN:   1.  Acute inferolateral ST elevation, having symptoms for 2 days, initial troponin 12.74  Recommendations  1.  Urgent cardiac catheterization and probable primary PCI  Signed: Isaias Cowman MD,PhD, [facility] 08/30/2018, 9:25 AM

## 2018-08-30 NOTE — Progress Notes (Signed)
Post HD Assessment Removed 1.5 liters during tx, tolerated well. Currently denies increased chest pain (still 2/10, as it was pre-tx).    08/30/18 1451  Neurological  Level of Consciousness Alert  Orientation Level Oriented X4  Respiratory  Respiratory Pattern Regular  Chest Assessment Chest expansion symmetrical  Bilateral Breath Sounds Diminished  Cardiac  Pulse Regular  ECG Monitor Yes  Cardiac Rhythm NSR  Vascular  R Radial Pulse +2  L Radial Pulse +2  Edema Generalized  Integumentary  Integumentary (WDL) X  Skin Color Appropriate for ethnicity  Skin Condition Dry  Skin Integrity  (scabbing at AVF from cannulation in 1spot.rotate site today)  Musculoskeletal  Musculoskeletal (WDL) WDL  Gastrointestinal  Bowel Sounds Assessment Active  GU Assessment  Genitourinary (WDL) X  Genitourinary Symptoms Anuria (HD pt 11+ years)  Psychosocial  Psychosocial (WDL) WDL

## 2018-08-30 NOTE — H&P (Signed)
[place] [facility] [place] NAME: Ralph Lucero    MR#:  [id number]  DATE OF BIRTH:  12/24/1975  DATE OF ADMISSION:  08/30/2018  PRIMARY CARE PHYSICIAN: Anthonette Legato, MD   REQUESTING/REFERRING PHYSICIAN: Dr. Owens Shark  CHIEF COMPLAINT:   Chief Complaint  Patient presents with  . Chest Pain    HISTORY OF PRESENT ILLNESS:  Ralph Lucero  is a 43 y.o. male with a known history of ESRD, HTN here with 3 days of chest pain that worsened and came to ED. EKG showing inferior ST elevation MI. History obtained in case discussed with patient with Spanish interpreter at bedside  Patient has had on and off chest pain radiating to left arm for 3 days.  No shortness of breath or palpitations or syncope.  He does not smoke or drink alcohol.  Is on hemodialysis and has been compliant. Patient has stopped taking his blood pressure medications a week back as he noticed palpitations with his blood pressure medications.  He tells me he was on a small pill in the past that has worked well for his blood pressure but does not remember the name.  Patient taken to cardiac cath lab later and found   Ost RPDA to RPDA lesion is 100% stenosed.  1st RPLB lesion is 75% stenosed.  Dist RCA lesion is 30% stenosed.  Ost 2nd Mrg to 2nd Mrg lesion is 75% stenosed.  Ost 1st Mrg lesion is 50% stenosed.  1st Mrg lesion is 40% stenosed.  Ost 1st Diag to 1st Diag lesion is 90% stenosed.  Ost 2nd Diag to 2nd Diag lesion is 90% stenosed.  2nd Mrg lesion is 80% stenosed.   1.  Inferolateral STEMI 2.  Occluded mid PDA probable culprit vessel 3.  Three-vessel coronary artery disease with diffuse distal RCA stenosis, OM1 and OM 2, D1 and D2 4.  Ischemic cardiomyopathy with estimated LV ejection fraction 30 to 35% 5.  Unsuccessful PCI of mid PDA  PAST MEDICAL HISTORY:   Past Medical History:  Diagnosis Date  . Hypertension   . Renal disorder     PAST SURGICAL HISTORY:    AV fistula placement  SOCIAL HISTORY:   Social History   Tobacco Use  . Smoking status: Never Smoker  . Smokeless tobacco: Never Used  Substance Use Topics  . Alcohol use: Never    Frequency: Never    FAMILY HISTORY:  History reviewed. No pertinent family history. HTN DRUG ALLERGIES:  Not on File  REVIEW OF SYSTEMS:   Review of Systems  Constitutional: Positive for malaise/fatigue. Negative for chills and fever.  HENT: Negative for sore throat.   Eyes: Negative for blurred vision, double vision and pain.  Respiratory: Negative for cough, hemoptysis, shortness of breath and wheezing.   Cardiovascular: Positive for chest pain. Negative for palpitations, orthopnea and leg swelling.  Gastrointestinal: Negative for abdominal pain, constipation, diarrhea, heartburn, nausea and vomiting.  Genitourinary: Negative for dysuria and hematuria.  Musculoskeletal: Negative for back pain and joint pain.  Skin: Negative for rash.  Neurological: Negative for sensory change, speech change, focal weakness and headaches.  Endo/Heme/Allergies: Does not bruise/bleed easily.  Psychiatric/Behavioral: Negative for depression. The patient is not nervous/anxious.     MEDICATIONS AT HOME:   Prior to Admission medications   Not on File     VITAL SIGNS:  Blood pressure (!) 166/114, pulse 78, temperature 98.7 F (37.1 C), temperature source Oral, resp. rate 13, height 5\' 9"  (1.753 m),  weight 56 kg, SpO2 97 %.  PHYSICAL EXAMINATION:  Physical Exam  GENERAL:  43 y.o.-year-old patient lying in the bed with no acute distress.  EYES: Pupils equal, round, reactive to light and accommodation. No scleral icterus. Extraocular muscles intact.  HEENT: Head atraumatic, normocephalic. Oropharynx and nasopharynx clear. No oropharyngeal erythema, moist oral mucosa  NECK:  Supple, no jugular venous distention. No thyroid enlargement, no tenderness.  LUNGS: Normal breath sounds bilaterally, no wheezing,  rales, rhonchi. No use of accessory muscles of respiration.  CARDIOVASCULAR: S1, S2 normal. No murmurs, rubs, or gallops.  ABDOMEN: Soft, nontender, nondistended. Bowel sounds present. No organomegaly or mass.  EXTREMITIES: No pedal edema, cyanosis, or clubbing. + 2 pedal & radial pulses b/l.  AV fistula left upper extremity NEUROLOGIC: Cranial nerves II through XII are intact. No focal Motor or sensory deficits appreciated b/l PSYCHIATRIC: The patient is alert and oriented x 3. Good affect.  SKIN: No obvious rash, lesion, or ulcer.   LABORATORY PANEL:   CBC Recent Labs  Lab 08/30/18 0701  WBC 7.4  HGB 15.6  HCT 47.4  PLT 152   ------------------------------------------------------------------------------------------------------------------  Chemistries  No results for input(s): NA, K, CL, CO2, GLUCOSE, BUN, CREATININE, CALCIUM, MG, AST, ALT, ALKPHOS, BILITOT in the last 168 hours.  Invalid input(s): GFRCGP ------------------------------------------------------------------------------------------------------------------  Cardiac Enzymes No results for input(s): TROPONINI in the last 168 hours. ------------------------------------------------------------------------------------------------------------------  RADIOLOGY:  Dg Chest Port 1 View  Result Date: 08/30/2018 CLINICAL DATA:  Chest pain EXAM: PORTABLE CHEST 1 VIEW COMPARISON:  None. FINDINGS: Cardiac shadow is mildly enlarged. The lungs are well aerated bilaterally. No focal infiltrate or sizable effusion is seen. No acute bony abnormality is noted. IMPRESSION: No active disease. Electronically Signed   By: Inez Catalina M.D.   On: 08/30/2018 07:13   IMPRESSION AND PLAN:   1.  Inferolateral STEMI   Occluded mid PDA probable culprit vessel.  Three-vessel coronary artery disease with diffuse distal RCA stenosis, OM1 and OM 2, D1 and D2. Unsuccessful PCI of mid PDA Aspirin, Brilinta, metoprolol, lisinopril,  atorvastatin Appreciate cardiology input  2.  Ischemic cardiomyopathy with estimated LV ejection fraction 30 to 35% No signs of fluid overload.  Started on metoprolol and lisinopril.  3.  Uncontrolled hypertension.  Patient has stopped taking his medications 1 week back.  Started on metoprolol and lisinopril.  Monitor.  4.  End-stage renal disease on hemodialysis.  Consult nephrology.  DVT prophylaxis with heparin  All the records are reviewed and case discussed with ED provider. Management plans discussed with the patient, family and they are in agreement.  CODE STATUS: FULL CODE  TOTAL TIME TAKING CARE OF THIS PATIENT: 40 minutes.   Leia Alf Dayyan Krist M.D on 08/30/2018 at 7:30 AM  Between 7am to 6pm - Pager - [phone]  After 6pm go to [email or web address] - password [email or web address] [facility]  Office  [phone]  CC: Primary care physician; Anthonette Legato, MD  Note: This dictation was prepared with Dragon dictation along with smaller phrase technology. Any transcriptional errors that result from this process are unintentional.

## 2018-08-30 NOTE — ED Notes (Signed)
Spoke with pt through interpreter, pt reports pain of 2/10 in the left side of chest and down the left arm. Pt reports pain since Tuesday. Reports no SOB or difficulty breathing.

## 2018-08-30 NOTE — Progress Notes (Signed)
Pre HD Tx    08/30/18 1045  Hand-Off documentation  Report given to (Full Name) Trellis Paganini RN  Report received from (Full Name) Leeanne Deed RN  Vital Signs  Temp 97.8 F (36.6 C)  Temp Source Oral  Pulse Rate 71  Pulse Rate Source Monitor  Resp 18  BP (!) 158/97  BP Location Right Leg  BP Method Automatic  Patient Position (if appropriate) Lying  Oxygen Therapy  SpO2 98 %  O2 Device Room Air  Pulse Oximetry Type Continuous  Pain Assessment  Pain Scale 0-10  Pain Score 2  Pain Type Acute pain  Pain Location Chest  Pain Orientation Mid  Pain Descriptors / Indicators Aching;Pressure  Pain Frequency  (for past 2 days- recent card.cath for STEMI)  Patients Stated Pain Goal 0  Pain Intervention(s) Rest;Medication (See eMAR);MD notified (Comment) (BP meds and morphine were given, pt stable,MD aware of pain)  Dialysis Weight  Weight 56 kg  Type of Weight Pre-Dialysis  Time-Out for Hemodialysis  What Procedure? Hemodialysis  Pt Identifiers(min of two) First/Last Name;MRN/Account#  Correct Site? Yes  Correct Side? Yes  Correct Procedure? Yes  Consents Verified? Yes  Rad Studies Available? N/A  Safety Precautions Reviewed? Yes  Engineer, civil (consulting) Number 5  Station Number  Comptroller)  UF/Alarm Test Passed  Conductivity: Meter 14  Conductivity: Machine  14  pH 7.2  Reverse Osmosis WRO1  Normal Saline Lot Number [id number]  Dialyzer Lot Number [id number]  Disposable Set Lot Number [id number]  Machine Temperature 98.6 F (37 C)  Musician and Audible Yes  Blood Lines Intact and Secured Yes  Pre Treatment Patient Checks  Vascular access used during treatment Fistula  Hepatitis B Surface Antigen Results Negative  Date Hepatitis B Surface Antigen Drawn 07/10/18  Hepatitis B Surface Antibody 270  Date Hepatitis B Surface Antibody Drawn 07/10/18  Hemodialysis Consent Verified Yes  Hemodialysis Standing Orders Initiated Yes  ECG (Telemetry) Monitor On Yes   Prime Ordered Normal Saline  Length of  DialysisTreatment -hour(s) 3.25 Hour(s)  Dialysis Treatment Comments Na 140  Dialyzer Elisio 17H NR  Dialysate 2K, 2.5 Ca  Dialysate Flow Ordered 800  Blood Flow Rate Ordered 300 mL/min  Ultrafiltration Goal 1.5 Liters  Pre Treatment Labs Phosphorus (PTH)  Dialysis Blood Pressure Support Ordered Normal Saline  Education / Care Plan  Dialysis Education Provided Yes  Documented Education in Care Plan Yes  Fistula / Graft Left Forearm Arteriovenous fistula  No Placement Date or Time found.   Placed prior to admission: Yes  Orientation: Left  Access Location: Forearm  Access Type: Arteriovenous fistula  Site Condition No complications;Other (Comment) (Noted scabs.Norm for pt;still must monitor,cannulate away from)  Fistula / Graft Assessment Present;Thrill;Bruit;Aneurism present (scabbed aneurisms noted,pre-existing for 6+yrs,[facility] MD aware)  Status Accessed  Needle Size 15  Drainage Description None

## 2018-08-30 NOTE — Consult Note (Signed)
Name: Ralph Lucero MRN: [id number] DOB: Feb 03, 1976    ADMISSION DATE:  08/30/2018 CONSULTATION DATE: 08/30/2018  REFERRING MD : Dr. Saralyn Pilar  CHIEF COMPLAINT: Chest Pain   BRIEF PATIENT DESCRIPTION:  43 yo male admitted with inferior and lateral ST elevation secondary to three-vessel coronary artery disease with diffuse distal RCA stenosis, OM1 and OM 2, D1 and D2 unsuccessful PCI of mid PDA   SIGNIFICANT EVENTS/STUDIES:  05/21-Pt admitted to ICU post cardiac cath 05/21-Cardiac catheterization revealed inferolateral STEMI. Occluded mid PDA probable culprit vessel. Three-vessel coronary artery disease with diffuse distal RCA stenosis, OM1 and OM 2, D1 and D2. Ischemic cardiomyopathy with estimated LV ejection fraction 30 to 35%. Unsuccessful PCI of mid PDA  HISTORY OF PRESENT ILLNESS:   This is a 43 yo male with a PMH of ESRD on HD (T-Th-Sat) and HTN.  He presented to [facility] ER on 05/21 via EMS from dialysis with c/o chest pain with radiation to the left arm onset of symptoms 3 days prior to presentation.  In the ER initial EKG revealed inferior ST segment elevation in lead III and aVF, ST depression in leads V1 and V2.  Therefore, STEMI protocol activated following evaluation by Cardiologist Dr. Saralyn Pilar pt transported for emergent cardiac catheterization.  Cardiac cath revealed inferolateral STEMI, occluded mid PDA probable culprit vessel, three-vessel coronary artery disease with diffuse distal RCA stenosis, OM1 and OM 2, D1 and D2, ischemic cardiomyopathy with estimated LV ejection fraction 30 to 35%. However, unsuccessful PCI of mid PDA.  He was subsequently admitted to ICU post procedure for additional workup and treatment.   PAST MEDICAL HISTORY :   has a past medical history of Hypertension and Renal disorder.  has no past surgical history on file. Prior to Admission medications   Not on File   Not on File  FAMILY HISTORY:  family history is not on file. SOCIAL HISTORY:  reports that he has never smoked. He has never used smokeless tobacco. He reports that he does not drink alcohol or use drugs.  REVIEW OF SYSTEMS: Positives in BOLD Constitutional: Negative for fever, chills, weight loss, malaise/fatigue and diaphoresis.  HENT: Negative for hearing loss, ear pain, nosebleeds, congestion, sore throat, neck pain, tinnitus and ear discharge.   Eyes: Negative for blurred vision, double vision, photophobia, pain, discharge and redness.  Respiratory: Negative for cough, hemoptysis, sputum production, shortness of breath, wheezing and stridor.   Cardiovascular: chest pain with radiation to left arm, palpitations, orthopnea, claudication, leg swelling and PND.  Gastrointestinal: Negative for heartburn, nausea, vomiting, abdominal pain, diarrhea, constipation, blood in stool and melena.  Genitourinary: Negative for dysuria, urgency, frequency, hematuria and flank pain.  Musculoskeletal: Negative for myalgias, back pain, joint pain and falls.  Skin: Negative for itching and rash.  Neurological: Negative for dizziness, tingling, tremors, sensory change, speech change, focal weakness, seizures, loss of consciousness, weakness and headaches.  Endo/Heme/Allergies: Negative for environmental allergies and polydipsia. Does not bruise/bleed easily.  SUBJECTIVE:  No complaints at this time  VITAL SIGNS: Temp:  [97.1 F (36.2 C)-98.7 F (37.1 C)] 97.1 F (36.2 C) (05/21 0916) Pulse Rate:  [74-85] 74 (05/21 0916) Resp:  [13-18] 15 (05/21 0916) BP: (162-170)/(103-130) 162/103 (05/21 0916) SpO2:  [96 %-100 %] 97 % (05/21 0916) Weight:  [56 kg] 56 kg (05/21 0658)  PHYSICAL EXAMINATION: General: well developed, well nourished male, NAD  Neuro: alert and oriented, follows commands  HEENT: supple, no JVD  Cardiovascular: nsr, rrr, no R/G  Lungs: faint crackles throughout,  even, non labored  Abdomen: +BS x4, soft, non tender, non distended  Musculoskeletal: normal bulk and  tone, no edema Skin: right radial TR band no bleeding or hematoma, left forearm av fistula +bruit and thrill  Recent Labs  Lab 08/30/18 0701  NA 138  K 4.7  CL 94*  CO2 26  BUN 72*  CREATININE 14.61*  GLUCOSE 156*   Recent Labs  Lab 08/30/18 0701  HGB 15.6  HCT 47.4  WBC 7.4  PLT 152   Dg Chest Port 1 View  Result Date: 08/30/2018 CLINICAL DATA:  Chest pain EXAM: PORTABLE CHEST 1 VIEW COMPARISON:  None. FINDINGS: Cardiac shadow is mildly enlarged. The lungs are well aerated bilaterally. No focal infiltrate or sizable effusion is seen. No acute bony abnormality is noted. IMPRESSION: No active disease. Electronically Signed   By: Inez Catalina M.D.   On: 08/30/2018 07:13    ASSESSMENT / PLAN:  Inferior and lateral ST elevation s/p cardiac catheterization unsuccessful PCI of mid PDA 08/30/2018 Hypertension  Continuous telemetry monitoring  Cardiology consulted appreciate input-recommendation medical management with dual antiplatelet therapy with aspirin and brilinta, metoprolol, lisinopril, and atorvastatin Echo pending  Prn Nitroglycerin gtt for chest pain and bp management   ESRD on HD (T-Th-Sat) Trend BMP  Replace electrolytes as indicated  Avoid nephrotoxic medications  Nephrology consulted appreciate input-HD per recommendations   VTE px: subq heparin   Marda Stalker, [place] Pager [phone] (please enter 7 digits) PCCM Consult Pager [phone] (please enter 7 digits)

## 2018-08-31 LAB — POTASSIUM: Potassium: 4.8 mmol/L (ref 3.5–5.1)

## 2018-08-31 LAB — PARATHYROID HORMONE, INTACT (NO CA): PTH: 79 pg/mL — ABNORMAL HIGH (ref 15–65)

## 2018-08-31 LAB — ECHOCARDIOGRAM COMPLETE
Height: 69 in
Weight: 1975.32 oz

## 2018-08-31 LAB — LIPID PANEL
Cholesterol: 301 mg/dL — ABNORMAL HIGH (ref 0–200)
HDL: 52 mg/dL (ref >40)
LDL Cholesterol: 232 mg/dL — ABNORMAL HIGH (ref 0–99)
Total CHOL/HDL Ratio: 5.8 RATIO
Triglycerides: 83 mg/dL (ref <150)
VLDL: 17 mg/dL (ref 0–40)

## 2018-08-31 LAB — BASIC METABOLIC PANEL
Anion gap: 16 — ABNORMAL HIGH (ref 5–15)
BUN: 40 mg/dL — ABNORMAL HIGH (ref 6–20)
CO2: 24 mmol/L (ref 22–32)
Calcium: 7.8 mg/dL — ABNORMAL LOW (ref 8.9–10.3)
Chloride: 94 mmol/L — ABNORMAL LOW (ref 98–111)
Creatinine, Ser: 10.72 mg/dL — ABNORMAL HIGH (ref 0.61–1.24)
GFR calc Af Amer: 6 mL/min — ABNORMAL LOW (ref >60)
GFR calc non Af Amer: 5 mL/min — ABNORMAL LOW (ref >60)
Glucose, Bld: 94 mg/dL (ref 70–99)
Potassium: 6 mmol/L — ABNORMAL HIGH (ref 3.5–5.1)
Sodium: 134 mmol/L — ABNORMAL LOW (ref 135–145)

## 2018-08-31 LAB — CBC
HCT: 50.4 % (ref 39.0–52.0)
Hemoglobin: 16.3 g/dL (ref 13.0–17.0)
MCH: 29.5 pg (ref 26.0–34.0)
MCHC: 32.3 g/dL (ref 30.0–36.0)
MCV: 91.1 fL (ref 80.0–100.0)
Platelets: 194 10*3/uL (ref 150–400)
RBC: 5.53 MIL/uL (ref 4.22–5.81)
RDW: 14.3 % (ref 11.5–15.5)
WBC: 9 10*3/uL (ref 4.0–10.5)
nRBC: 0 % (ref 0.0–0.2)

## 2018-08-31 LAB — TROPONIN I: Troponin I: 25.88 ng/mL (ref <0.03)

## 2018-08-31 MED ORDER — RENA-VITE PO TABS
1.0000 | ORAL_TABLET | Freq: Every day | ORAL | Status: DC
  Administered 2018-08-31: 1 via ORAL
  Filled 2018-08-31 (×2): qty 1

## 2018-08-31 MED ORDER — LISINOPRIL 20 MG PO TABS
40.0000 mg | ORAL_TABLET | Freq: Every day | ORAL | Status: DC
  Administered 2018-08-31: 40 mg via ORAL
  Filled 2018-08-31: qty 2

## 2018-08-31 MED ORDER — SODIUM POLYSTYRENE SULFONATE 15 GM/60ML PO SUSP
30.0000 g | Freq: Once | ORAL | Status: AC
  Administered 2018-08-31: 06:00:00 30 g via ORAL
  Filled 2018-08-31: qty 120

## 2018-08-31 MED ORDER — NEPRO/CARBSTEADY PO LIQD: 237.0000 mL | Freq: Two times a day (BID) | ORAL | Status: DC

## 2018-08-31 MED ORDER — ISOSORBIDE MONONITRATE ER 60 MG PO TB24
60.0000 mg | ORAL_TABLET | Freq: Every day | ORAL | Status: DC
  Administered 2018-08-31: 60 mg via ORAL
  Filled 2018-08-31: qty 2

## 2018-08-31 NOTE — Progress Notes (Signed)
Cardiac Rehab EP Note  An Interpreter was utilized for this conversation.  "Heart Attack Bouncing Back" booklet given and reviewed with patient. This EP discussed the definition of CAD. This EP reviewed the location of CAD and where his blockages are located. ? This EP discussed modifiable risk factors including controlling blood pressure, cholesterol, and blood sugar; following heart healthy diet; maintaining healthy weight; exercise; and smoking cessation. ? This EP discussed cardiac medications including rationale for taking, mechanisms of action, and side effects.This EP asked the patient why he had stopped taking his BP meds one week prior. Patient reports that the medication was giving him heart palpitations. This EP verified with patient that he should never stop taking medication without talking with a MD first. This EP verified that he told the MD and RN here that he wanted to change BP medication. Stressed the importance of taking medications as prescribed. Patient has Dialysis 3 times per week, T, Th, S at [facility]. Patient drives himself there. ? This EP discussed emergency plan for heart attack symptoms. Patient verbalized understanding of need to call 911 and not to drive himself to ER if having cardiac symptoms / chest pain.    ? Diet of low sodium, low fat, low cholesterol heart healthy / carb modified diet discussed. Information on diet provided.  ? Smoking Cessation - Patient is a NEVER smoker.  Stress- Patient reports being sad and stressed due to losing both parents last year. Patient lives in a home with 4 other friends and has a good support system.  ? Exercise - Benefits of exercised discussed. 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 He does not exercise. Patient reports that he has 14 stairs to climb at home and does that without any problem. Informed patient his cardiologist has referred him to outpatient Cardiac Rehab. An overview of the program was  provided. Brochure, informational letter, class and orientation times, and CPT billing codes given to patient. Patient is interested in participating. Patient plans to check with his insurance company to see what his out-of-pocket expenses will be.   Patient informed the Cardiac Rehab department is currently closed due to the COVID-19 pandemic.  The Cardiac Rehab dept will contact patient as soon as the department reopens.  If Patient's insurance does not cover Cardiac Rehab, Virtual rehab will be a good fit for him. ?  Patient appreciative of the information.  ? Jasper Loser, [facility]  Exercise Physiologist Department Phone #: [phone] Fax: [phone]  Direct Line [phone] Email Address: [email or web address]

## 2018-08-31 NOTE — TOC Initial Note (Signed)
Transition of Care [facility]) - Initial/Assessment Note    Patient Details  Name: Ralph Lucero MRN: [id number] Date of Birth: 1975-12-11  Transition of Care [facility]) CM/SW Contact:    Shelbie Hutching, RN Phone Number: 08/31/2018, 1:43 PM  Clinical Narrative:                 Patient reports that he is very sleepy.  Patient reports that he drives himself to and from dialysis and that he gets his prescriptions from [facility] he has charity care with them.  Patient reports that most of his care is provided by [facility].  Patient may need MATCH if discharged over the weekend or on Monday.    Expected Discharge Plan: Home/Self Care Barriers to Discharge: Continued Medical Work up   Patient Goals and CMS Choice        Expected Discharge Plan and Services Expected Discharge Plan: Home/Self Care   Discharge Planning Services: CM Consult, Medication Assistance                                          Prior Living Arrangements/Services     Patient language and need for interpreter reviewed:: No              Criminal Activity/Legal Involvement Pertinent to Current Situation/Hospitalization: No - Comment as needed  Activities of Daily Living      Permission Sought/Granted                  Emotional Assessment Appearance:: Appears stated age Attitude/Demeanor/Rapport: Lethargic Affect (typically observed): Accepting Orientation: : Oriented to Self, Oriented to Place, Oriented to  Time Alcohol / Substance Use: Not Applicable Psych Involvement: No (comment)  Admission diagnosis:  Acute ST elevation myocardial infarction (STEMI) involving other coronary artery of inferior wall (HCC) [I21.19] STEMI involving oth coronary artery of inferior wall ([place]) [I21.19] Patient Active Problem List   Diagnosis Date Noted  . STEMI involving oth coronary artery of inferior wall ([place]) 08/30/2018   PCP:  Anthonette Legato, MD Pharmacy:  No Pharmacies Listed    Social Determinants of Health  (SDOH) Interventions    Readmission Risk Interventions No flowsheet data found.

## 2018-08-31 NOTE — Progress Notes (Signed)
Nutrition Brief Note   RD received consult for heart healthy/renal diet education. RD currently working remotely r/t COVID 19 pandemic. Will plan to provide education to patient when RD is back onsite next week as pt will require interpreter.   Koleen Distance MS, RD, LDN Pager #- [phone] Office#- [phone] After Hours Pager: [phone]

## 2018-08-31 NOTE — Progress Notes (Signed)
[facility] Cardiology  SUBJECTIVE: Patient laying in bed, denies chest pain   Vitals:   08/31/18 0100 08/31/18 0230 08/31/18 0500 08/31/18 0600  BP: (!) 142/89  (!) 142/88 (!) 135/103  Pulse: 80  83 78  Resp: 17  13 13   Temp:  98.6 F (37 C)    TempSrc:  Oral    SpO2: 94%  96% 95%  Weight:      Height:         Intake/Output Summary (Last 24 hours) at 08/31/2018 0753 Last data filed at 08/30/2018 1800 Gross per 24 hour  Intake 200 ml  Output 1500 ml  Net -1300 ml      PHYSICAL EXAM  General: Well developed, well nourished, in no acute distress HEENT:  Normocephalic and atramatic Neck:  No JVD.  Lungs: Clear bilaterally to auscultation and percussion. Heart: HRRR . Normal S1 and S2 without gallops or murmurs.  Abdomen: Bowel sounds are positive, abdomen soft and non-tender  Msk:  Back normal, normal gait. Normal strength and tone for age. Extremities: No clubbing, cyanosis or edema.   Neuro: Alert and oriented X 3. Psych:  Good affect, responds appropriately   LABS: Basic Metabolic Panel: Recent Labs    08/30/18 0701 08/30/18 1120 08/31/18 0130  NA 138  --  134*  K 4.7  --  6.0*  CL 94*  --  94*  CO2 26  --  24  GLUCOSE 156*  --  94  BUN 72*  --  40*  CREATININE 14.61*  --  10.72*  CALCIUM 8.0*  --  7.8*  PHOS  --  4.5  --    Liver Function Tests: No results for input(s): AST, ALT, ALKPHOS, BILITOT, PROT, ALBUMIN in the last 72 hours. No results for input(s): LIPASE, AMYLASE in the last 72 hours. CBC: Recent Labs    08/30/18 0701 08/31/18 0130  WBC 7.4 9.0  HGB 15.6 16.3  HCT 47.4 50.4  MCV 91.5 91.1  PLT 152 194   Cardiac Enzymes: Recent Labs    08/30/18 1145 08/30/18 1911 08/31/18 0130  TROPONINI 15.74* 17.40* 25.88*   BNP: Invalid input(s): POCBNP D-Dimer: No results for input(s): DDIMER in the last 72 hours. Hemoglobin A1C: Recent Labs    08/30/18 0701  HGBA1C 5.8*   Fasting Lipid Panel: Recent Labs    08/31/18 0130  CHOL 301*   HDL 52  LDLCALC 232*  TRIG 83  CHOLHDL 5.8   Thyroid Function Tests: No results for input(s): TSH, T4TOTAL, T3FREE, THYROIDAB in the last 72 hours.  Invalid input(s): FREET3 Anemia Panel: No results for input(s): VITAMINB12, FOLATE, FERRITIN, TIBC, IRON, RETICCTPCT in the last 72 hours.  Dg Chest Port 1 View  Result Date: 08/30/2018 CLINICAL DATA:  Chest pain EXAM: PORTABLE CHEST 1 VIEW COMPARISON:  None. FINDINGS: Cardiac shadow is mildly enlarged. The lungs are well aerated bilaterally. No focal infiltrate or sizable effusion is seen. No acute bony abnormality is noted. IMPRESSION: No active disease. Electronically Signed   By: Inez Catalina M.D.   On: 08/30/2018 07:13     Echo pending  TELEMETRY: Sinus rhythm:  ASSESSMENT AND PLAN:  Active Problems:   STEMI involving oth coronary artery of inferior wall ([place])    1.  Inferolateral STEMI, delayed presentation, unsuccessful PCI attempt of occluded mid PDA 2.  Three-vessel coronary artery disease, including diffuse stenosis of small caliber D1, D2, OM 2 3.  Ischemic cardiomyopathy, estimated LV ejection fraction 35%, echo pending 4.  ESRD,  on chronic hemodialysis 5.  Essential hypertension, blood pressure elevated 6.  Medical noncompliance  Recommendations  1.  Continue current medical therapy 2.  DC nitroglycerin drip 3.  Start isosorbide mononitrate 60 mg daily 4.  Review 2D echocardiogram 5.  May transfer to telemetry   Isaias Cowman, MD, PhD, [facility] 08/31/2018 7:53 AM

## 2018-08-31 NOTE — Progress Notes (Signed)
[facility]  ROUNDING NOTE   Subjective:  Patient underwent dialysis and cardiac catheterization yesterday. Resting comfortably without chest pain at the moment. Appears to be in good spirits.   Objective:  Vital signs in last 24 hours:  Temp:  [97.9 F (36.6 C)-98.6 F (37 C)] 97.9 F (36.6 C) (05/22 1300) Pulse Rate:  [64-109] 109 (05/22 1300) Resp:  [13-24] 20 (05/22 1300) BP: (112-168)/(85-105) 147/100 (05/22 1300) SpO2:  [93 %-100 %] 93 % (05/22 1300) Weight:  [54.5 kg] 54.5 kg (05/21 1430)  Weight change: 0 kg Filed Weights   08/30/18 0658 08/30/18 1045 08/30/18 1430  Weight: 56 kg 56 kg 54.5 kg    Intake/Output: I/O last 3 completed shifts: In: 200 [P.O.:200] Out: 1500 [Other:1500]   Intake/Output this shift:  No intake/output data recorded.  Physical Exam: General: No acute distress  Head: Normocephalic, atraumatic. Moist oral mucosal membranes  Eyes: Anicteric  Neck: Supple, trachea midline  Lungs:  Clear to auscultation, normal effort  Heart: S1S2 no rubs  Abdomen:  Soft, nontender, bowel sounds present  Extremities: No peripheral edema.  Neurologic: Awake, alert, following commands  Skin: No lesions  Access: L AVF    Basic Metabolic Panel: Recent Labs  Lab 08/30/18 0701 08/30/18 1120 08/31/18 0130 08/31/18 1236  NA 138  --  134*  --   K 4.7  --  6.0* 4.8  CL 94*  --  94*  --   CO2 26  --  24  --   GLUCOSE 156*  --  94  --   BUN 72*  --  40*  --   CREATININE 14.61*  --  10.72*  --   CALCIUM 8.0*  --  7.8*  --   PHOS  --  4.5  --   --     Liver Function Tests: No results for input(s): AST, ALT, ALKPHOS, BILITOT, PROT, ALBUMIN in the last 168 hours. No results for input(s): LIPASE, AMYLASE in the last 168 hours. No results for input(s): AMMONIA in the last 168 hours.  CBC: Recent Labs  Lab 08/30/18 0701 08/31/18 0130  WBC 7.4 9.0  HGB 15.6 16.3  HCT 47.4 50.4  MCV 91.5 91.1  PLT 152 194    Cardiac  Enzymes: Recent Labs  Lab 08/30/18 0701 08/30/18 1145 08/30/18 1911 08/31/18 0130  TROPONINI 12.74* 15.74* 17.40* 25.88*    BNP: Invalid input(s): POCBNP  CBG: Recent Labs  Lab 08/30/18 0920  GLUCAP 8    Microbiology: Results for orders placed or performed during the hospital encounter of 08/30/18  SARS Coronavirus 2 (CEPHEID - Performed in [place] hospital lab), Hosp Order     Status: None   Collection Time: 08/30/18  7:02 AM  Result Value Ref Range Status   SARS Coronavirus 2 NEGATIVE NEGATIVE Final    Comment: (NOTE) If result is NEGATIVE SARS-CoV-2 target nucleic acids are NOT DETECTED. The SARS-CoV-2 RNA is generally detectable in upper and lower  respiratory specimens during the acute phase of infection. The lowest  concentration of SARS-CoV-2 viral copies this assay can detect is 250  copies / mL. A negative result does not preclude SARS-CoV-2 infection  and should not be used as the sole basis for treatment or other  patient management decisions.  A negative result may occur with  improper specimen collection / handling, submission of specimen other  than nasopharyngeal swab, presence of viral mutation(s) within the  areas targeted by this assay, and inadequate number of viral  copies  (<250 copies / mL). A negative result must be combined with clinical  observations, patient history, and epidemiological information. If result is POSITIVE SARS-CoV-2 target nucleic acids are DETECTED. The SARS-CoV-2 RNA is generally detectable in upper and lower  respiratory specimens dur ing the acute phase of infection.  Positive  results are indicative of active infection with SARS-CoV-2.  Clinical  correlation with patient history and other diagnostic information is  necessary to determine patient infection status.  Positive results do  not rule out bacterial infection or co-infection with other viruses. If result is PRESUMPTIVE POSTIVE SARS-CoV-2 nucleic acids MAY BE  PRESENT.   A presumptive positive result was obtained on the submitted specimen  and confirmed on repeat testing.  While 2019 novel coronavirus  (SARS-CoV-2) nucleic acids may be present in the submitted sample  additional confirmatory testing may be necessary for epidemiological  and / or clinical management purposes  to differentiate between  SARS-CoV-2 and other Sarbecovirus currently known to infect humans.  If clinically indicated additional testing with an alternate test  methodology [phone]) is advised. The SARS-CoV-2 RNA is generally  detectable in upper and lower respiratory sp ecimens during the acute  phase of infection. The expected result is Negative. Fact Sheet for Patients:  StrictlyIdeas.no Fact Sheet for Healthcare Providers: BankingDealers.co.za This test is not yet approved or cleared by the [place] FDA and has been authorized for detection and/or diagnosis of SARS-CoV-2 by FDA under an Emergency Use Authorization (EUA).  This EUA will remain in effect (meaning this test can be used) for the duration of the COVID-19 declaration under Section 564(b)(1) of the Act, 21 U.S.C. section 360bbb-3(b)(1), unless the authorization is terminated or revoked sooner. Performed at [facility], [address] [place], [place] [address]   MRSA PCR Screening     Status: None   Collection Time: 08/30/18  9:34 AM  Result Value Ref Range Status   MRSA by PCR NEGATIVE NEGATIVE Final    Comment:        The GeneXpert MRSA Assay (FDA approved for NASAL specimens only), is one component of a comprehensive MRSA colonization surveillance program. It is not intended to diagnose MRSA infection nor to guide or monitor treatment for MRSA infections. Performed at [facility], [address] [place],  [address]     Coagulation Studies: No results for input(s): LABPROT, INR in the last 72  hours.  Urinalysis: No results for input(s): COLORURINE, LABSPEC, PHURINE, GLUCOSEU, HGBUR, BILIRUBINUR, KETONESUR, PROTEINUR, UROBILINOGEN, NITRITE, LEUKOCYTESUR in the last 72 hours.  Invalid input(s): APPERANCEUR    Imaging: Dg Chest Port 1 View  Result Date: 08/30/2018 CLINICAL DATA:  Chest pain EXAM: PORTABLE CHEST 1 VIEW COMPARISON:  None. FINDINGS: Cardiac shadow is mildly enlarged. The lungs are well aerated bilaterally. No focal infiltrate or sizable effusion is seen. No acute bony abnormality is noted. IMPRESSION: No active disease. Electronically Signed   By: Inez Catalina M.D.   On: 08/30/2018 07:13     Medications:   . sodium chloride    . sodium chloride    . sodium chloride     . aspirin  324 mg Oral Once  . aspirin  81 mg Oral Daily  . atorvastatin  80 mg Oral q1800  . Chlorhexidine Gluconate Cloth  6 each Topical Q0600  . heparin injection (subcutaneous)  5,000 Units Subcutaneous Q8H  . isosorbide mononitrate  60 mg Oral Daily  . lisinopril  40 mg Oral Daily  .  metoprolol tartrate  50 mg Oral BID  . sodium chloride flush  3 mL Intravenous Q12H  . ticagrelor  90 mg Oral BID   sodium chloride, sodium chloride, sodium chloride, acetaminophen, alteplase, heparin, lidocaine (PF), lidocaine-prilocaine, ondansetron (ZOFRAN) IV, pentafluoroprop-tetrafluoroeth, sodium chloride flush  Assessment/ Plan:  43 y.o. male with a PMHx of end-stage renal disease on hemodialysis, hypertension, anemia of chronic kidney disease, secondary hyperparathyroidism, who was admitted to [facility] on 08/30/2018 for evaluation of chest pain.  Patient found to have inferolateral myocardial infarction and PCI was attempted however unsuccessful.  Suspected lesion was in the mid posterior descending artery.  1.  ESRD on HD TTS.    Patient completed hemodialysis yesterday.  No acute indication for dialysis today.  We will resume normal schedule tomorrow.  2.  Coronary artery disease with lesion in mid  posterior descending artery unamenable to PCI/chronic systolic heart failure ejection fraction 30 to 35%.  PCI attempted but unsuccessful.  Cardiology recommends medical management with dual antiplatelet therapy.  3.  Secondary hyperparathyroidism.    Continue periodically monitor bone mineral metabolism parameters.  4.  Hypertension.    Continue lisinopril and metoprolol for blood pressure control.   LOS: 1 Lamiyah Schlotter 5/22/20201:31 PM

## 2018-09-01 LAB — CBC WITH DIFFERENTIAL/PLATELET
Abs Immature Granulocytes: 0.03 10*3/uL (ref 0.00–0.07)
Basophils Absolute: 0.1 10*3/uL (ref 0.0–0.1)
Basophils Relative: 1 %
Eosinophils Absolute: 0.5 10*3/uL (ref 0.0–0.5)
Eosinophils Relative: 6 %
HCT: 45.8 % (ref 39.0–52.0)
Hemoglobin: 14.8 g/dL (ref 13.0–17.0)
Immature Granulocytes: 0 %
Lymphocytes Relative: 8 %
Lymphs Abs: 0.7 10*3/uL (ref 0.7–4.0)
MCH: 29.8 pg (ref 26.0–34.0)
MCHC: 32.3 g/dL (ref 30.0–36.0)
MCV: 92.2 fL (ref 80.0–100.0)
Monocytes Absolute: 0.7 10*3/uL (ref 0.1–1.0)
Monocytes Relative: 8 %
Neutro Abs: 6.4 10*3/uL (ref 1.7–7.7)
Neutrophils Relative %: 77 %
Platelets: 149 10*3/uL — ABNORMAL LOW (ref 150–400)
RBC: 4.97 MIL/uL (ref 4.22–5.81)
RDW: 14.6 % (ref 11.5–15.5)
WBC: 8.3 10*3/uL (ref 4.0–10.5)
nRBC: 0 % (ref 0.0–0.2)

## 2018-09-01 LAB — BASIC METABOLIC PANEL
Anion gap: 18 — ABNORMAL HIGH (ref 5–15)
BUN: 66 mg/dL — ABNORMAL HIGH (ref 6–20)
CO2: 30 mmol/L (ref 22–32)
Calcium: 7.2 mg/dL — ABNORMAL LOW (ref 8.9–10.3)
Chloride: 88 mmol/L — ABNORMAL LOW (ref 98–111)
Creatinine, Ser: 14.64 mg/dL — ABNORMAL HIGH (ref 0.61–1.24)
GFR calc Af Amer: 4 mL/min — ABNORMAL LOW (ref >60)
GFR calc non Af Amer: 4 mL/min — ABNORMAL LOW (ref >60)
Glucose, Bld: 89 mg/dL (ref 70–99)
Potassium: 5.8 mmol/L — ABNORMAL HIGH (ref 3.5–5.1)
Sodium: 136 mmol/L (ref 135–145)

## 2018-09-01 MED ORDER — ATORVASTATIN CALCIUM 80 MG PO TABS: 80.0000 mg | ORAL_TABLET | Freq: Every day | ORAL | 0 refills | Status: AC

## 2018-09-01 MED ORDER — RENA-VITE PO TABS: 1.0000 | ORAL_TABLET | Freq: Every day | ORAL | 0 refills | Status: DC

## 2018-09-01 MED ORDER — ASPIRIN 81 MG PO CHEW: 81.0000 mg | CHEWABLE_TABLET | Freq: Every day | ORAL | 0 refills | Status: DC

## 2018-09-01 MED ORDER — METOPROLOL TARTRATE 50 MG PO TABS: 50.0000 mg | ORAL_TABLET | Freq: Two times a day (BID) | ORAL | 0 refills | Status: AC

## 2018-09-01 MED ORDER — LISINOPRIL 5 MG PO TABS: 5.0000 mg | ORAL_TABLET | Freq: Every day | ORAL | 0 refills | Status: AC

## 2018-09-01 MED ORDER — SODIUM CHLORIDE 0.9 % IV SOLN: 100.0000 mL | INTRAVENOUS | Status: DC | PRN

## 2018-09-01 MED ORDER — TICAGRELOR 90 MG PO TABS: 90.0000 mg | ORAL_TABLET | Freq: Two times a day (BID) | ORAL | 0 refills | Status: DC

## 2018-09-01 MED ORDER — ISOSORBIDE MONONITRATE ER 60 MG PO TB24: 60.0000 mg | ORAL_TABLET | Freq: Every day | ORAL | 0 refills | Status: DC

## 2018-09-01 MED ORDER — LISINOPRIL 5 MG PO TABS: 5.0000 mg | ORAL_TABLET | Freq: Every day | ORAL | Status: DC

## 2018-09-01 NOTE — Plan of Care (Signed)

## 2018-09-01 NOTE — Progress Notes (Signed)
Post HD Assessment    09/01/18 1500  Neurological  Level of Consciousness Alert  Orientation Level Oriented X4  Respiratory  Respiratory Pattern Regular;Unlabored  Chest Assessment Chest expansion symmetrical  Bilateral Breath Sounds Clear  Cardiac  Pulse Regular  Heart Sounds S1, S2  ECG Monitor Yes  Cardiac Rhythm NSR  Vascular  R Radial Pulse +2  L Radial Pulse +2  Integumentary  Integumentary (WDL) WDL  Musculoskeletal  Musculoskeletal (WDL) WDL  Gastrointestinal  Bowel Sounds Assessment Active  GU Assessment  Genitourinary (WDL) X  Genitourinary Symptoms Anuria (HD patient)  Psychosocial  Psychosocial (WDL) WDL

## 2018-09-01 NOTE — Progress Notes (Signed)
HD Tx End  1.5 liters removed, tolerated tx well.     09/01/18 1430  Vital Signs  Pulse Rate (!) 181  Resp 18  BP 100/75  Oxygen Therapy  SpO2 91 %  During Hemodialysis Assessment  Blood Flow Rate (mL/min) 300 mL/min  Arterial Pressure (mmHg) -150 mmHg  Venous Pressure (mmHg) 180 mmHg  Transmembrane Pressure (mmHg) 60 mmHg  Ultrafiltration Rate (mL/min) 620 mL/min  Dialysate Flow Rate (mL/min) 800 ml/min  Conductivity: Machine  14  HD Safety Checks Performed Yes  Intra-Hemodialysis Comments Tx completed

## 2018-09-01 NOTE — Progress Notes (Signed)
Transfer to room 230 delayed until now. Patient transferred via w/c with NT on cardiac monitor. Central telemetry notified of transfer. Patient to notify family in AM.

## 2018-09-01 NOTE — Progress Notes (Signed)
[facility]  ROUNDING NOTE   Subjective:  Patient seen and evaluated during hemodialysis today. Tolerating well. No chest pain at the moment.   Objective:  Vital signs in last 24 hours:  Temp:  [98.4 F (36.9 C)-99.5 F (37.5 C)] 98.4 F (36.9 C) (05/23 1045) Pulse Rate:  [73-111] 79 (05/23 1300) Resp:  [0-25] 18 (05/23 1300) BP: (98-127)/(68-88) 102/74 (05/23 1300) SpO2:  [92 %-100 %] 97 % (05/23 1300) Weight:  [55.6 kg] 55.6 kg (05/23 1045)  Weight change: -0.434 kg Filed Weights   08/30/18 1430 09/01/18 0330 09/01/18 1045  Weight: 54.5 kg 55.6 kg 55.6 kg    Intake/Output: No intake/output data recorded.   Intake/Output this shift:  Total I/O In: 360 [P.O.:360] Out: -   Physical Exam: General: No acute distress  Head: Normocephalic, atraumatic. Moist oral mucosal membranes  Eyes: Anicteric  Neck: Supple, trachea midline  Lungs:  Clear to auscultation, normal effort  Heart: S1S2 no rubs  Abdomen:  Soft, nontender, bowel sounds present  Extremities: No peripheral edema.  Neurologic: Awake, alert, following commands  Skin: No lesions  Access: L AVF    Basic Metabolic Panel: Recent Labs  Lab 08/30/18 0701 08/30/18 1120 08/31/18 0130 08/31/18 1236 09/01/18 0420  NA 138  --  134*  --  136  K 4.7  --  6.0* 4.8 5.8*  CL 94*  --  94*  --  88*  CO2 26  --  24  --  30  GLUCOSE 156*  --  94  --  89  BUN 72*  --  40*  --  66*  CREATININE 14.61*  --  10.72*  --  14.64*  CALCIUM 8.0*  --  7.8*  --  7.2*  PHOS  --  4.5  --   --   --     Liver Function Tests: No results for input(s): AST, ALT, ALKPHOS, BILITOT, PROT, ALBUMIN in the last 168 hours. No results for input(s): LIPASE, AMYLASE in the last 168 hours. No results for input(s): AMMONIA in the last 168 hours.  CBC: Recent Labs  Lab 08/30/18 0701 08/31/18 0130 09/01/18 0420  WBC 7.4 9.0 8.3  NEUTROABS  --   --  6.4  HGB 15.6 16.3 14.8  HCT 47.4 50.4 45.8  MCV 91.5 91.1 92.2  PLT  152 194 149*    Cardiac Enzymes: Recent Labs  Lab 08/30/18 0701 08/30/18 1145 08/30/18 1911 08/31/18 0130  TROPONINI 12.74* 15.74* 17.40* 25.88*    BNP: Invalid input(s): POCBNP  CBG: Recent Labs  Lab 08/30/18 0920  GLUCAP 29    Microbiology: Results for orders placed or performed during the hospital encounter of 08/30/18  SARS Coronavirus 2 (CEPHEID - Performed in [place] hospital lab), Hosp Order     Status: None   Collection Time: 08/30/18  7:02 AM  Result Value Ref Range Status   SARS Coronavirus 2 NEGATIVE NEGATIVE Final    Comment: (NOTE) If result is NEGATIVE SARS-CoV-2 target nucleic acids are NOT DETECTED. The SARS-CoV-2 RNA is generally detectable in upper and lower  respiratory specimens during the acute phase of infection. The lowest  concentration of SARS-CoV-2 viral copies this assay can detect is 250  copies / mL. A negative result does not preclude SARS-CoV-2 infection  and should not be used as the sole basis for treatment or other  patient management decisions.  A negative result may occur with  improper specimen collection / handling, submission of specimen other  than nasopharyngeal swab, presence of viral mutation(s) within the  areas targeted by this assay, and inadequate number of viral copies  (<250 copies / mL). A negative result must be combined with clinical  observations, patient history, and epidemiological information. If result is POSITIVE SARS-CoV-2 target nucleic acids are DETECTED. The SARS-CoV-2 RNA is generally detectable in upper and lower  respiratory specimens dur ing the acute phase of infection.  Positive  results are indicative of active infection with SARS-CoV-2.  Clinical  correlation with patient history and other diagnostic information is  necessary to determine patient infection status.  Positive results do  not rule out bacterial infection or co-infection with other viruses. If result is PRESUMPTIVE  POSTIVE SARS-CoV-2 nucleic acids MAY BE PRESENT.   A presumptive positive result was obtained on the submitted specimen  and confirmed on repeat testing.  While 2019 novel coronavirus  (SARS-CoV-2) nucleic acids may be present in the submitted sample  additional confirmatory testing may be necessary for epidemiological  and / or clinical management purposes  to differentiate between  SARS-CoV-2 and other Sarbecovirus currently known to infect humans.  If clinically indicated additional testing with an alternate test  methodology [phone]) is advised. The SARS-CoV-2 RNA is generally  detectable in upper and lower respiratory sp ecimens during the acute  phase of infection. The expected result is Negative. Fact Sheet for Patients:  StrictlyIdeas.no Fact Sheet for Healthcare Providers: BankingDealers.co.za This test is not yet approved or cleared by the [place] FDA and has been authorized for detection and/or diagnosis of SARS-CoV-2 by FDA under an Emergency Use Authorization (EUA).  This EUA will remain in effect (meaning this test can be used) for the duration of the COVID-19 declaration under Section 564(b)(1) of the Act, 21 U.S.C. section 360bbb-3(b)(1), unless the authorization is terminated or revoked sooner. Performed at [facility], [address] [place], [place] [address]   MRSA PCR Screening     Status: None   Collection Time: 08/30/18  9:34 AM  Result Value Ref Range Status   MRSA by PCR NEGATIVE NEGATIVE Final    Comment:        The GeneXpert MRSA Assay (FDA approved for NASAL specimens only), is one component of a comprehensive MRSA colonization surveillance program. It is not intended to diagnose MRSA infection nor to guide or monitor treatment for MRSA infections. Performed at [facility], [address] [place], [place] [address]     Coagulation Studies: No results for input(s):  LABPROT, INR in the last 72 hours.  Urinalysis: No results for input(s): COLORURINE, LABSPEC, PHURINE, GLUCOSEU, HGBUR, BILIRUBINUR, KETONESUR, PROTEINUR, UROBILINOGEN, NITRITE, LEUKOCYTESUR in the last 72 hours.  Invalid input(s): APPERANCEUR    Imaging: No results found.   Medications:   . sodium chloride    . sodium chloride    . sodium chloride    . sodium chloride    . sodium chloride     . aspirin  81 mg Oral Daily  . atorvastatin  80 mg Oral q1800  . Chlorhexidine Gluconate Cloth  6 each Topical Q0600  . feeding supplement (NEPRO CARB STEADY)  237 mL Oral BID BM  . heparin injection (subcutaneous)  5,000 Units Subcutaneous Q8H  . isosorbide mononitrate  60 mg Oral Daily  . lisinopril  5 mg Oral Daily  . metoprolol tartrate  50 mg Oral BID  . multivitamin  1 tablet Oral QHS  . sodium chloride flush  3 mL Intravenous Q12H  .  ticagrelor  90 mg Oral BID   sodium chloride, sodium chloride, sodium chloride, sodium chloride, sodium chloride, acetaminophen, alteplase, heparin, lidocaine (PF), lidocaine-prilocaine, ondansetron (ZOFRAN) IV, pentafluoroprop-tetrafluoroeth, sodium chloride flush  Assessment/ Plan:  43 y.o. male with a PMHx of end-stage renal disease on hemodialysis, hypertension, anemia of chronic kidney disease, secondary hyperparathyroidism, who was admitted to [facility] on 08/30/2018 for evaluation of chest pain.  Patient found to have inferolateral myocardial infarction and PCI was attempted however unsuccessful.  Suspected lesion was in the mid posterior descending artery.  1.  ESRD on HD TTS.    Patient seen and evaluated during hemodialysis.  Tolerating well.  Complete dialysis treatment today.  2.  Coronary artery disease with lesion in mid posterior descending artery unamenable to PCI/chronic systolic heart failure ejection fraction 30 to 35%.  PCI attempted but unsuccessful.  Cardiology recommends medical management with dual antiplatelet therapy.  3.   Secondary hyperparathyroidism.    Serum phosphorus currently 4.5.  Hold off on binders at this time.  4.  Hypertension.    Blood pressure remains under good control at 102/74.  Continue lisinopril and metoprolol.   LOS: 2 Shaydon Lease 5/23/20201:19 PM

## 2018-09-01 NOTE — Progress Notes (Signed)
Post HD Tx   1.5 liters removed, tolerated well.     09/01/18 1445  Hand-Off documentation  Report given to (Full Name) Crystal 2A RN  Report received from (Full Name) Trellis Paganini RN  Vital Signs  Temp 98.8 F (37.1 C)  Temp Source Oral  Pulse Rate 87  Pulse Rate Source Monitor  Resp 18  BP 102/70  BP Location Right Arm  BP Method Automatic  Patient Position (if appropriate) Lying  Oxygen Therapy  SpO2 96 %  O2 Device Room Air  Pain Assessment  Pain Scale 0-10  Pain Score 0  Dialysis Weight  Weight 54.1 kg  Type of Weight Post-Dialysis  Post-Hemodialysis Assessment  Rinseback Volume (mL) 250 mL  Dialyzer Clearance Lightly streaked  Duration of HD Treatment -hour(s) 3.5 hour(s)  Hemodialysis Intake (mL) 500 mL  UF Total -Machine (mL) 2000 mL  Net UF (mL) 1500 mL  Tolerated HD Treatment Yes  AVG/AVF Arterial Site Held (minutes) 10 minutes  AVG/AVF Venous Site Held (minutes) 10 minutes  Fistula / Graft Left Forearm Arteriovenous fistula  No Placement Date or Time found.   Placed prior to admission: Yes  Orientation: Left  Access Location: Forearm  Access Type: Arteriovenous fistula  Site Condition No complications  Fistula / Graft Assessment Bruit;Thrill;Present;Other (Comment);Aneurism present  Status Deaccessed  Drainage Description None

## 2018-09-01 NOTE — Progress Notes (Signed)
Pre HD Tx    09/01/18 1045  Hand-Off documentation  Report given to (Full Name) Trellis Paganini RN  Report received from (Full Name) Scheryl Darter RN  Vital Signs  Temp 98.4 F (36.9 C)  Temp Source Oral  Pulse Rate 81  Pulse Rate Source Monitor  Resp 15  BP 109/70  BP Location Right Arm  BP Method Automatic  Patient Position (if appropriate) Lying  Oxygen Therapy  SpO2 96 %  O2 Device Room Air  Pulse Oximetry Type Continuous  Pain Assessment  Pain Scale 0-10  Pain Score 0  Dialysis Weight  Weight 55.6 kg  Type of Weight Pre-Dialysis  Time-Out for Hemodialysis  What Procedure? Hemodialysis  Pt Identifiers(min of two) First/Last Name;MRN/Account#  Correct Site? Yes  Correct Side? Yes  Correct Procedure? Yes  Consents Verified? Yes  Rad Studies Available? N/A  Safety Precautions Reviewed? Yes  Engineer, civil (consulting) Number 4  Station Number 4  UF/Alarm Test Passed  Conductivity: Meter 14  Conductivity: Machine  14  pH 7.2  Reverse Osmosis main  Normal Saline Lot Number [id number]  Dialyzer Lot Number [id number]  Disposable Set Lot Number [id number]  Machine Temperature 98.6 F (37 C)  Musician and Audible Yes  Blood Lines Intact and Secured Yes  Pre Treatment Patient Checks  Vascular access used during treatment Fistula  Hepatitis B Surface Antigen Results Negative  Date Hepatitis B Surface Antigen Drawn 07/10/18  Hepatitis B Surface Antibody  (>10)  Date Hepatitis B Surface Antibody Drawn 07/10/18  Hemodialysis Consent Verified Yes  Hemodialysis Standing Orders Initiated Yes  ECG (Telemetry) Monitor On Yes  Prime Ordered Normal Saline  Length of  DialysisTreatment -hour(s) 3.5 Hour(s)  Dialysis Treatment Comments Na 140  Dialysate 2K, 2.5 Ca  Dialysate Flow Ordered 800  Blood Flow Rate Ordered 400 mL/min  Ultrafiltration Goal 1.5 Liters  Dialysis Blood Pressure Support Ordered Normal Saline  Education / Care Plan  Dialysis Education Provided Yes   Documented Education in Care Plan Yes  Fistula / Graft Left Forearm Arteriovenous fistula  No Placement Date or Time found.   Placed prior to admission: Yes  Orientation: Left  Access Location: Forearm  Access Type: Arteriovenous fistula  Site Condition No complications  Fistula / Graft Assessment Bruit;Thrill;Present  Status Accessed  Needle Size 15  Drainage Description None

## 2018-09-01 NOTE — TOC Transition Note (Signed)
Transition of Care [facility]) - CM/SW Discharge Note   Patient Details  Name: Ralph Lucero MRN: [id number] Date of Birth: 1976/03/16  [facility] CM/SW Contact:  Latanya Maudlin, RN Phone Number: 09/01/2018, 12:11 PM   Clinical Narrative:  Patient followed by [place]. Plans to resume. Brilinta voucher given. All other medication are on [facility] $4 list.      Final next level of care: Home/Self Care Barriers to Discharge: No Barriers Identified   Patient Goals and CMS Choice        Discharge Placement                       Discharge Plan and Services   Discharge Planning Services: CM Consult, Medication Assistance                                 Social Determinants of Health (SDOH) Interventions     Readmission Risk Interventions Readmission Risk Prevention Plan 09/01/2018  Transportation Screening Complete  PCP or Specialist Appt within 5-7 Days Complete  Home Care Screening Complete  Medication Review (RN CM) Complete  Some recent data might be hidden

## 2018-09-01 NOTE — Progress Notes (Signed)
09/01/2018 1656 Discharge process complete. Discharge papers went over with patient with interpreter He has no questions or concerns at this time. Pain is under control, personal items gathered and taken with patient and PIVs removed. Paper scripts given to patient as well as Brilinta voucher given to patient. Vital signs stable. Tele Box removed and returned. Wheelchair escort off unit by staff member. Patient is off unit. Care relinquished.

## 2018-09-01 NOTE — Progress Notes (Signed)
Pre HD Assessment    09/01/18 1045  Neurological  Level of Consciousness Alert  Orientation Level Oriented X4  Respiratory  Respiratory Pattern Regular;Unlabored  Chest Assessment Chest expansion symmetrical  Bilateral Breath Sounds Clear  Cardiac  Pulse Regular  Heart Sounds S1, S2  ECG Monitor Yes  Cardiac Rhythm NSR  Vascular  R Radial Pulse +2  L Radial Pulse +2  Integumentary  Integumentary (WDL) WDL  Musculoskeletal  Musculoskeletal (WDL) WDL  Gastrointestinal  Bowel Sounds Assessment Active  GU Assessment  Genitourinary (WDL) X  Genitourinary Symptoms Anuria (HD patient)  Psychosocial  Psychosocial (WDL) WDL

## 2018-09-01 NOTE — Progress Notes (Signed)
HD Tx Start    09/01/18 1100  Vital Signs  Pulse Rate 80  Resp 13  BP 103/72  Oxygen Therapy  SpO2 96 %  During Hemodialysis Assessment  Blood Flow Rate (mL/min) 300 mL/min  Arterial Pressure (mmHg) -120 mmHg  Venous Pressure (mmHg) 170 mmHg  Transmembrane Pressure (mmHg) 50 mmHg  Ultrafiltration Rate (mL/min) 620 mL/min (620 mL removal per HOUR)  Dialysate Flow Rate (mL/min) 800 ml/min  Conductivity: Machine  14  HD Safety Checks Performed Yes  Dialysis Fluid Bolus Normal Saline  Bolus Amount (mL) 250 mL  Intra-Hemodialysis Comments Tx initiated (BFR at 300,MD aware this is order in clinic d/t heart issues)

## 2018-09-01 NOTE — Progress Notes (Signed)
[facility] Cardiology  SUBJECTIVE: Patient laying in bed, denies chest pain or shortness of breath   Vitals:   09/01/18 0200 09/01/18 0300 09/01/18 0330 09/01/18 0753  BP: 98/75 104/75 117/88 117/80  Pulse: 79 73 81 81  Resp: 17 (!) 0 20 19  Temp: 98.5 F (36.9 C)  98.6 F (37 C) 98.5 F (36.9 C)  TempSrc: Oral  Oral Oral  SpO2: 92% 98% 100% 97%  Weight:   55.6 kg   Height:        No intake or output data in the 24 hours ending 09/01/18 0905    PHYSICAL EXAM  General: Well developed, well nourished, in no acute distress HEENT:  Normocephalic and atramatic Neck:  No JVD.  Lungs: Clear bilaterally to auscultation and percussion. Heart: HRRR . Normal S1 and S2 without gallops or murmurs.  Abdomen: Bowel sounds are positive, abdomen soft and non-tender  Msk:  Back normal, normal gait. Normal strength and tone for age. Extremities: No clubbing, cyanosis or edema.   Neuro: Alert and oriented X 3. Psych:  Good affect, responds appropriately   LABS: Basic Metabolic Panel: Recent Labs    08/30/18 1120 08/31/18 0130 08/31/18 1236 09/01/18 0420  NA  --  134*  --  136  K  --  6.0* 4.8 5.8*  CL  --  94*  --  88*  CO2  --  24  --  30  GLUCOSE  --  94  --  89  BUN  --  40*  --  66*  CREATININE  --  10.72*  --  14.64*  CALCIUM  --  7.8*  --  7.2*  PHOS 4.5  --   --   --    Liver Function Tests: No results for input(s): AST, ALT, ALKPHOS, BILITOT, PROT, ALBUMIN in the last 72 hours. No results for input(s): LIPASE, AMYLASE in the last 72 hours. CBC: Recent Labs    08/31/18 0130 09/01/18 0420  WBC 9.0 8.3  NEUTROABS  --  6.4  HGB 16.3 14.8  HCT 50.4 45.8  MCV 91.1 92.2  PLT 194 149*   Cardiac Enzymes: Recent Labs    08/30/18 1145 08/30/18 1911 08/31/18 0130  TROPONINI 15.74* 17.40* 25.88*   BNP: Invalid input(s): POCBNP D-Dimer: No results for input(s): DDIMER in the last 72 hours. Hemoglobin A1C: Recent Labs    08/30/18 0701  HGBA1C 5.8*   Fasting Lipid  Panel: Recent Labs    08/31/18 0130  CHOL 301*  HDL 52  LDLCALC 232*  TRIG 83  CHOLHDL 5.8   Thyroid Function Tests: No results for input(s): TSH, T4TOTAL, T3FREE, THYROIDAB in the last 72 hours.  Invalid input(s): FREET3 Anemia Panel: No results for input(s): VITAMINB12, FOLATE, FERRITIN, TIBC, IRON, RETICCTPCT in the last 72 hours.  No results found.   Echo LVEF 40 to 45%  TELEMETRY: Sinus rhythm:  ASSESSMENT AND PLAN:  Active Problems:   STEMI involving oth coronary artery of inferior wall (HCC)    1.  Inferolateral STEMI, delayed presentations, unsuccessful PCI of occluded mid PDA 2.  Three-vessel CAD, with diffuse stenosis small caliber D1, D2, OM 2 3.  Mild ischemic cardiomyopathy, LVEF 40 to 45% 4.  Essential hypertension 5.  ESRD on chronic hemodialysis  Recommendations  1.  Agree with current therapy 2.  Continue dual antiplatelet therapy 3.  May discharge home, follow-up in 1 to 2 weeks  Sign off for now, please call if any questions   Isaias Cowman,  MD, PhD, [facility] 09/01/2018 9:05 AM

## 2018-09-03 NOTE — Discharge Summary (Signed)
[facility] [place] NAME: Ralph Lucero    MR#:  [id number]  DATE OF BIRTH:  [birth year]  DATE OF ADMISSION:  08/30/2018 ADMITTING PHYSICIAN: Isaias Cowman, MD  DATE OF DISCHARGE: 09/01/2018  5:14 PM  PRIMARY CARE PHYSICIAN: Anthonette Legato, MD   ADMISSION DIAGNOSIS:  Acute ST elevation myocardial infarction (STEMI) involving other coronary artery of inferior wall (HCC) [I21.19] STEMI involving oth coronary artery of inferior wall ([place]) [I21.19]  DISCHARGE DIAGNOSIS:  Active Problems:   STEMI involving oth coronary artery of inferior wall ([place])   SECONDARY DIAGNOSIS:   Past Medical History:  Diagnosis Date  . Hypertension   . Renal disorder      ADMITTING HISTORY  Ralph Lucero  is a 43 y.o. male with a known history of ESRD, HTN here with 3 days of chest pain that worsened and came to ED. EKG showing inferior ST elevation MI. History obtained in case discussed with patient with Spanish interpreter at bedside  Patient has had on and off chest pain radiating to left arm for 3 days.  No shortness of breath or palpitations or syncope.  He does not smoke or drink alcohol.  Is on hemodialysis and has been compliant. Patient has stopped taking his blood pressure medications a week back as he noticed palpitations with his blood pressure medications.  He tells me he was on a small pill in the past that has worked well for his blood pressure but does not remember the name.  HOSPITAL COURSE:   1. Inferolateral STEMI Occluded mid PDA probable culprit vessel.Three-vessel coronary artery disease with diffuse distal RCA stenosis, OM1 and OM 2, D1 and D2. Unsuccessful PCI of mid PDA Aspirin, Brilinta, metoprolol, lisinopril, atorvastatin Appreciate cardiology input  Cath showed  Patient taken to cardiac cath lab later and found   Ost RPDA to RPDA lesion is 100% stenosed.  1st RPLB lesion is 75% stenosed.  Dist RCA lesion  is 30% stenosed.  Ost 2nd Mrg to 2nd Mrg lesion is 75% stenosed.  Ost 1st Mrg lesion is 50% stenosed.  1st Mrg lesion is 40% stenosed.  Ost 1st Diag to 1st Diag lesion is 90% stenosed.  Ost 2nd Diag to 2nd Diag lesion is 90% stenosed.  2nd Mrg lesion is 80% stenosed.  1. Inferolateral STEMI 2. Occluded mid PDA probable culprit vessel 3. Three-vessel coronary artery disease with diffuse distal RCA stenosis, OM1 and OM 2, D1 and D2 4. Ischemic cardiomyopathy with estimated LV ejection fraction 30 to 35% 5. Unsuccessful PCI of mid PDA  *Ischemic cardiomyopathy with estimated LV ejection fraction 30 to 35% No signs of fluid overload. Started on metoprolol and lisinopril.  *Uncontrolled hypertension. Patient has stopped taking his medications 1 week back. Started on metoprolol and lisinopril, imdur IMproved  *End-stage renal disease on hemodialysis. Consult nephrology. Had HD prior to discharge  Stable for d/c home  CONSULTS OBTAINED:  Treatment Team:  Isaias Cowman, MD Anthonette Legato, MD  DRUG ALLERGIES:  Not on File  DISCHARGE MEDICATIONS:   Allergies as of 09/01/2018   Not on File     Medication List    TAKE these medications   aspirin 81 MG chewable tablet Chew 1 tablet (81 mg total) by mouth daily.   atorvastatin 80 MG tablet Commonly known as:  LIPITOR Take 1 tablet (80 mg total) by mouth daily at 6 PM.   isosorbide mononitrate 60 MG 24 hr tablet Commonly known as:  IMDUR Take 1  tablet (60 mg total) by mouth daily.   lisinopril 5 MG tablet Commonly known as:  ZESTRIL Take 1 tablet (5 mg total) by mouth daily.   metoprolol tartrate 50 MG tablet Commonly known as:  LOPRESSOR Take 1 tablet (50 mg total) by mouth 2 (two) times daily.   multivitamin Tabs tablet Take 1 tablet by mouth at bedtime.   ticagrelor 90 MG Tabs tablet Commonly known as:  BRILINTA Take 1 tablet (90 mg total) by mouth 2 (two) times daily.        Today   VITAL SIGNS:  Blood pressure 102/70, pulse 87, temperature 98.8 F (37.1 C), temperature source Oral, resp. rate 18, height 5\' 9"  (1.753 m), weight 54.1 kg, SpO2 96 %.  I/O:  No intake or output data in the 24 hours ending 09/03/18 1437  PHYSICAL EXAMINATION:  Physical Exam  GENERAL:  43 y.o.-year-old patient lying in the bed with no acute distress.  LUNGS: Normal breath sounds bilaterally, no wheezing, rales,rhonchi or crepitation. No use of accessory muscles of respiration.  CARDIOVASCULAR: S1, S2 normal. No murmurs, rubs, or gallops.  ABDOMEN: Soft, non-tender, non-distended. Bowel sounds present. No organomegaly or mass.  NEUROLOGIC: Moves all 4 extremities. PSYCHIATRIC: The patient is alert and oriented x 3.  SKIN: No obvious rash, lesion, or ulcer.   DATA REVIEW:   CBC Recent Labs  Lab 09/01/18 0420  WBC 8.3  HGB 14.8  HCT 45.8  PLT 149*    Chemistries  Recent Labs  Lab 09/01/18 0420  NA 136  K 5.8*  CL 88*  CO2 30  GLUCOSE 89  BUN 66*  CREATININE 14.64*  CALCIUM 7.2*    Cardiac Enzymes Recent Labs  Lab 08/31/18 0130  TROPONINI 25.88*    Microbiology Results  Results for orders placed or performed during the hospital encounter of 08/30/18  SARS Coronavirus 2 (CEPHEID - Performed in [place] hospital lab), Hosp Order     Status: None   Collection Time: 08/30/18  7:02 AM  Result Value Ref Range Status   SARS Coronavirus 2 NEGATIVE NEGATIVE Final    Comment: (NOTE) If result is NEGATIVE SARS-CoV-2 target nucleic acids are NOT DETECTED. The SARS-CoV-2 RNA is generally detectable in upper and lower  respiratory specimens during the acute phase of infection. The lowest  concentration of SARS-CoV-2 viral copies this assay can detect is 250  copies / mL. A negative result does not preclude SARS-CoV-2 infection  and should not be used as the sole basis for treatment or other  patient management decisions.  A negative result may occur  with  improper specimen collection / handling, submission of specimen other  than nasopharyngeal swab, presence of viral mutation(s) within the  areas targeted by this assay, and inadequate number of viral copies  (<250 copies / mL). A negative result must be combined with clinical  observations, patient history, and epidemiological information. If result is POSITIVE SARS-CoV-2 target nucleic acids are DETECTED. The SARS-CoV-2 RNA is generally detectable in upper and lower  respiratory specimens dur ing the acute phase of infection.  Positive  results are indicative of active infection with SARS-CoV-2.  Clinical  correlation with patient history and other diagnostic information is  necessary to determine patient infection status.  Positive results do  not rule out bacterial infection or co-infection with other viruses. If result is PRESUMPTIVE POSTIVE SARS-CoV-2 nucleic acids MAY BE PRESENT.   A presumptive positive result was obtained on the submitted specimen  and confirmed on  repeat testing.  While 2019 novel coronavirus  (SARS-CoV-2) nucleic acids may be present in the submitted sample  additional confirmatory testing may be necessary for epidemiological  and / or clinical management purposes  to differentiate between  SARS-CoV-2 and other Sarbecovirus currently known to infect humans.  If clinically indicated additional testing with an alternate test  methodology [phone]) is advised. The SARS-CoV-2 RNA is generally  detectable in upper and lower respiratory sp ecimens during the acute  phase of infection. The expected result is Negative. Fact Sheet for Patients:  StrictlyIdeas.no Fact Sheet for Healthcare Providers: BankingDealers.co.za This test is not yet approved or cleared by the [place] FDA and has been authorized for detection and/or diagnosis of SARS-CoV-2 by FDA under an Emergency Use Authorization (EUA).  This EUA  will remain in effect (meaning this test can be used) for the duration of the COVID-19 declaration under Section 564(b)(1) of the Act, 21 U.S.C. section 360bbb-3(b)(1), unless the authorization is terminated or revoked sooner. Performed at [facility], [address] [place], [place] [address]   MRSA PCR Screening     Status: None   Collection Time: 08/30/18  9:34 AM  Result Value Ref Range Status   MRSA by PCR NEGATIVE NEGATIVE Final    Comment:        The GeneXpert MRSA Assay (FDA approved for NASAL specimens only), is one component of a comprehensive MRSA colonization surveillance program. It is not intended to diagnose MRSA infection nor to guide or monitor treatment for MRSA infections. Performed at [facility], [address], [place], [place] [address]     RADIOLOGY:  No results found.  Follow up with PCP in 1 week.  Management plans discussed with the patient, family and they are in agreement.  CODE STATUS:  Code Status History    Date Active Date Inactive Code Status Order ID Comments User Context   08/30/2018 0919 09/01/2018 2020 Full Code [id number]  Isaias Cowman, MD Inpatient      TOTAL TIME TAKING CARE OF THIS PATIENT ON DAY OF DISCHARGE: more than 30 minutes.   Leia Alf Alya Smaltz M.D on 09/03/2018 at 2:38 PM  Between 7am to 6pm - Pager - [phone]  After 6pm go to [email or web address] - password [email or web address] [facility]  Office  [phone]  CC: Primary care physician; Anthonette Legato, MD  Note: This dictation was prepared with Dragon dictation along with smaller phrase technology. Any transcriptional errors that result from this process are unintentional.

## 2018-09-03 NOTE — Progress Notes (Signed)
[facility] [place] NAME: Ralph Lucero    MR#:  [id number]  DATE OF BIRTH:  07/25/1975  SUBJECTIVE:  CHIEF COMPLAINT:   Chief Complaint  Patient presents with  . Chest Pain   No cp or SOB Being moved to floor  REVIEW OF SYSTEMS:    Review of Systems  Constitutional: Negative for chills and fever.  HENT: Negative for sore throat.   Eyes: Negative for blurred vision, double vision and pain.  Respiratory: Negative for cough, hemoptysis, shortness of breath and wheezing.   Cardiovascular: Negative for chest pain, palpitations, orthopnea and leg swelling.  Gastrointestinal: Negative for abdominal pain, constipation, diarrhea, heartburn, nausea and vomiting.  Genitourinary: Negative for dysuria and hematuria.  Musculoskeletal: Negative for back pain and joint pain.  Skin: Negative for rash.  Neurological: Negative for sensory change, speech change, focal weakness and headaches.  Endo/Heme/Allergies: Does not bruise/bleed easily.  Psychiatric/Behavioral: Negative for depression. The patient is not nervous/anxious.     DRUG ALLERGIES:  Not on File  VITALS:  Blood pressure 102/70, pulse 87, temperature 98.8 F (37.1 C), temperature source Oral, resp. rate 18, height 5\' 9"  (1.753 m), weight 54.1 kg, SpO2 96 %.  PHYSICAL EXAMINATION:   Physical Exam  GENERAL:  43 y.o.-year-old patient lying in the bed with no acute distress.  EYES: Pupils equal, round, reactive to light and accommodation. No scleral icterus. Extraocular muscles intact.  HEENT: Head atraumatic, normocephalic. Oropharynx and nasopharynx clear.  NECK:  Supple, no jugular venous distention. No thyroid enlargement, no tenderness.  LUNGS: Normal breath sounds bilaterally, no wheezing, rales, rhonchi. No use of accessory muscles of respiration.  CARDIOVASCULAR: S1, S2 normal. No murmurs, rubs, or gallops.  ABDOMEN: Soft, nontender, nondistended. Bowel sounds present. No  organomegaly or mass.  EXTREMITIES: No cyanosis, clubbing or edema b/l.    NEUROLOGIC: Cranial nerves II through XII are intact. No focal Motor or sensory deficits b/l.   PSYCHIATRIC: The patient is alert and oriented x 3.  SKIN: No obvious rash, lesion, or ulcer.   LABORATORY PANEL:   CBC Recent Labs  Lab 09/01/18 0420  WBC 8.3  HGB 14.8  HCT 45.8  PLT 149*   ------------------------------------------------------------------------------------------------------------------ Chemistries  Recent Labs  Lab 09/01/18 0420  NA 136  K 5.8*  CL 88*  CO2 30  GLUCOSE 89  BUN 66*  CREATININE 14.64*  CALCIUM 7.2*   ------------------------------------------------------------------------------------------------------------------  Cardiac Enzymes Recent Labs  Lab 08/31/18 0130  TROPONINI 25.88*   ------------------------------------------------------------------------------------------------------------------  RADIOLOGY:  No results found.   ASSESSMENT AND PLAN:   1. Inferolateral STEMI  Occluded mid PDA probable culprit vessel. Three-vessel coronary artery disease with diffuse distal RCA stenosis, OM1 and OM 2, D1 and D2. Unsuccessful PCI of mid PDA Aspirin, Brilinta, metoprolol, lisinopril, atorvastatin Appreciate cardiology input  2. Ischemic cardiomyopathy with estimated LV ejection fraction 30 to 35% No signs of fluid overload.  Started on metoprolol and lisinopril.  3.  Uncontrolled hypertension.  Patient has stopped taking his medications 1 week back.  Started on metoprolol and lisinopril.  Imdur added today.  4.  End-stage renal disease on hemodialysis.  Consult nephrology. HD again tomorrow   All the records are reviewed and case discussed with Care Management/Social Workerr. Management plans discussed with the patient, family and they are in agreement.  CODE STATUS: FULL CODE  DVT Prophylaxis: SCDs  TOTAL TIME TAKING CARE OF THIS PATIENT: 30  minutes.   POSSIBLE D/C IN 1-2 DAYS,  DEPENDING ON CLINICAL CONDITION.  Leia Alf Shauntavia Brackin M.D on 09/03/2018 at 2:36 PM  Between 7am to 6pm - Pager - [phone]  After 6pm go to [email or web address] - password [email or web address] [place] [facility]  Office  [phone]  CC: Primary care physician; Anthonette Legato, MD  Note: This dictation was prepared with Dragon dictation along with smaller phrase technology. Any transcriptional errors that result from this process are unintentional.

## 2018-12-17 ENCOUNTER — Inpatient Hospital Stay
Admission: EM | Admit: 2018-12-17 | Discharge: 2018-12-27 | DRG: 981 | Disposition: A | Discharge status: Home / Self Care | Payer: Medicaid Other | Attending: Specialist | Admitting: Specialist

## 2018-12-17 DIAGNOSIS — I871 Compression of vein: Secondary | ICD-10-CM | POA: Diagnosis present

## 2018-12-17 DIAGNOSIS — E1151 Type 2 diabetes mellitus with diabetic peripheral angiopathy without gangrene: Secondary | ICD-10-CM | POA: Diagnosis present

## 2018-12-17 DIAGNOSIS — T79A21A Traumatic compartment syndrome of right lower extremity, initial encounter: Secondary | ICD-10-CM | POA: Diagnosis present

## 2018-12-17 DIAGNOSIS — E1122 Type 2 diabetes mellitus with diabetic chronic kidney disease: Secondary | ICD-10-CM | POA: Diagnosis present

## 2018-12-17 DIAGNOSIS — I255 Ischemic cardiomyopathy: Secondary | ICD-10-CM | POA: Diagnosis present

## 2018-12-17 DIAGNOSIS — I160 Hypertensive urgency: Secondary | ICD-10-CM | POA: Diagnosis present

## 2018-12-17 DIAGNOSIS — R0602 Shortness of breath: Secondary | ICD-10-CM

## 2018-12-17 DIAGNOSIS — Z9119 Patient's noncompliance with other medical treatment and regimen: Secondary | ICD-10-CM

## 2018-12-17 DIAGNOSIS — E211 Secondary hyperparathyroidism, not elsewhere classified: Secondary | ICD-10-CM | POA: Diagnosis present

## 2018-12-17 DIAGNOSIS — I120 Hypertensive chronic kidney disease with stage 5 chronic kidney disease or end stage renal disease: Secondary | ICD-10-CM | POA: Diagnosis present

## 2018-12-17 DIAGNOSIS — I252 Old myocardial infarction: Secondary | ICD-10-CM

## 2018-12-17 DIAGNOSIS — I248 Other forms of acute ischemic heart disease: Secondary | ICD-10-CM | POA: Diagnosis present

## 2018-12-17 DIAGNOSIS — E875 Hyperkalemia: Secondary | ICD-10-CM | POA: Diagnosis present

## 2018-12-17 DIAGNOSIS — X58XXXA Exposure to other specified factors, initial encounter: Secondary | ICD-10-CM | POA: Diagnosis present

## 2018-12-17 DIAGNOSIS — D62 Acute posthemorrhagic anemia: Secondary | ICD-10-CM | POA: Diagnosis present

## 2018-12-17 DIAGNOSIS — Z992 Dependence on renal dialysis: Secondary | ICD-10-CM

## 2018-12-17 DIAGNOSIS — J9621 Acute and chronic respiratory failure with hypoxia: Secondary | ICD-10-CM | POA: Diagnosis present

## 2018-12-17 DIAGNOSIS — Z20828 Contact with and (suspected) exposure to other viral communicable diseases: Secondary | ICD-10-CM | POA: Diagnosis present

## 2018-12-17 DIAGNOSIS — S7011XA Contusion of right thigh, initial encounter: Secondary | ICD-10-CM | POA: Diagnosis present

## 2018-12-17 DIAGNOSIS — Y712 Prosthetic and other implants, materials and accessory cardiovascular devices associated with adverse incidents: Secondary | ICD-10-CM | POA: Diagnosis present

## 2018-12-17 DIAGNOSIS — D631 Anemia in chronic kidney disease: Secondary | ICD-10-CM | POA: Diagnosis present

## 2018-12-17 DIAGNOSIS — I724 Aneurysm of artery of lower extremity: Secondary | ICD-10-CM | POA: Diagnosis present

## 2018-12-17 DIAGNOSIS — Z79899 Other long term (current) drug therapy: Secondary | ICD-10-CM

## 2018-12-17 DIAGNOSIS — E11649 Type 2 diabetes mellitus with hypoglycemia without coma: Secondary | ICD-10-CM | POA: Diagnosis present

## 2018-12-17 DIAGNOSIS — J9601 Acute respiratory failure with hypoxia: Secondary | ICD-10-CM

## 2018-12-17 DIAGNOSIS — T82510A Breakdown (mechanical) of surgically created arteriovenous fistula, initial encounter: Secondary | ICD-10-CM | POA: Diagnosis present

## 2018-12-17 DIAGNOSIS — J810 Acute pulmonary edema: Principal | ICD-10-CM | POA: Diagnosis present

## 2018-12-17 DIAGNOSIS — E8770 Fluid overload, unspecified: Secondary | ICD-10-CM | POA: Diagnosis present

## 2018-12-17 DIAGNOSIS — N186 End stage renal disease: Secondary | ICD-10-CM | POA: Diagnosis present

## 2018-12-17 DIAGNOSIS — I2510 Atherosclerotic heart disease of native coronary artery without angina pectoris: Secondary | ICD-10-CM | POA: Diagnosis present

## 2018-12-17 DIAGNOSIS — Z9115 Patient's noncompliance with renal dialysis: Secondary | ICD-10-CM

## 2018-12-17 HISTORY — DX: Dependence on renal dialysis: N18.6

## 2018-12-17 HISTORY — DX: Dependence on renal dialysis: Z99.2

## 2018-12-17 MED ORDER — SODIUM CHLORIDE 0.9 % IV SOLN
1.0000 g | Freq: Once | INTRAVENOUS | Status: DC
  Filled 2018-12-17: qty 10

## 2018-12-17 NOTE — ED Triage Notes (Signed)
Patient states that he went to his dialysis treatment on Saturday, but they were unable to use his veins. His last dialysis treatment was in Thursday. Patient is now having chest pain and nausea.

## 2018-12-18 ENCOUNTER — Encounter
Admission: EM | Disposition: A | Discharge status: Home / Self Care | Payer: Self-pay | Source: Home / Self Care | Attending: Internal Medicine

## 2018-12-18 ENCOUNTER — Emergency Department: Payer: Medicaid Other

## 2018-12-18 ENCOUNTER — Other Ambulatory Visit: Payer: Self-pay

## 2018-12-18 DIAGNOSIS — D62 Acute posthemorrhagic anemia: Secondary | ICD-10-CM

## 2018-12-18 DIAGNOSIS — S7011XA Contusion of right thigh, initial encounter: Secondary | ICD-10-CM | POA: Diagnosis present

## 2018-12-18 DIAGNOSIS — E211 Secondary hyperparathyroidism, not elsewhere classified: Secondary | ICD-10-CM | POA: Diagnosis present

## 2018-12-18 DIAGNOSIS — J9621 Acute and chronic respiratory failure with hypoxia: Secondary | ICD-10-CM | POA: Diagnosis present

## 2018-12-18 DIAGNOSIS — I248 Other forms of acute ischemic heart disease: Secondary | ICD-10-CM | POA: Diagnosis present

## 2018-12-18 DIAGNOSIS — X58XXXA Exposure to other specified factors, initial encounter: Secondary | ICD-10-CM | POA: Diagnosis present

## 2018-12-18 DIAGNOSIS — E1122 Type 2 diabetes mellitus with diabetic chronic kidney disease: Secondary | ICD-10-CM | POA: Diagnosis present

## 2018-12-18 DIAGNOSIS — I724 Aneurysm of artery of lower extremity: Secondary | ICD-10-CM | POA: Diagnosis present

## 2018-12-18 DIAGNOSIS — I120 Hypertensive chronic kidney disease with stage 5 chronic kidney disease or end stage renal disease: Secondary | ICD-10-CM | POA: Diagnosis present

## 2018-12-18 DIAGNOSIS — I255 Ischemic cardiomyopathy: Secondary | ICD-10-CM | POA: Diagnosis present

## 2018-12-18 DIAGNOSIS — I871 Compression of vein: Secondary | ICD-10-CM | POA: Diagnosis present

## 2018-12-18 DIAGNOSIS — I160 Hypertensive urgency: Secondary | ICD-10-CM | POA: Diagnosis present

## 2018-12-18 DIAGNOSIS — R0602 Shortness of breath: Secondary | ICD-10-CM | POA: Diagnosis present

## 2018-12-18 DIAGNOSIS — E1151 Type 2 diabetes mellitus with diabetic peripheral angiopathy without gangrene: Secondary | ICD-10-CM | POA: Diagnosis present

## 2018-12-18 DIAGNOSIS — D631 Anemia in chronic kidney disease: Secondary | ICD-10-CM | POA: Diagnosis present

## 2018-12-18 DIAGNOSIS — T79A21A Traumatic compartment syndrome of right lower extremity, initial encounter: Secondary | ICD-10-CM | POA: Diagnosis present

## 2018-12-18 DIAGNOSIS — N186 End stage renal disease: Secondary | ICD-10-CM

## 2018-12-18 DIAGNOSIS — E875 Hyperkalemia: Secondary | ICD-10-CM | POA: Diagnosis present

## 2018-12-18 DIAGNOSIS — I2510 Atherosclerotic heart disease of native coronary artery without angina pectoris: Secondary | ICD-10-CM | POA: Diagnosis present

## 2018-12-18 DIAGNOSIS — I959 Hypotension, unspecified: Secondary | ICD-10-CM

## 2018-12-18 DIAGNOSIS — T82510A Breakdown (mechanical) of surgically created arteriovenous fistula, initial encounter: Secondary | ICD-10-CM | POA: Diagnosis present

## 2018-12-18 DIAGNOSIS — E8770 Fluid overload, unspecified: Secondary | ICD-10-CM | POA: Diagnosis present

## 2018-12-18 DIAGNOSIS — J810 Acute pulmonary edema: Secondary | ICD-10-CM | POA: Diagnosis present

## 2018-12-18 DIAGNOSIS — Z992 Dependence on renal dialysis: Secondary | ICD-10-CM

## 2018-12-18 DIAGNOSIS — Z20828 Contact with and (suspected) exposure to other viral communicable diseases: Secondary | ICD-10-CM | POA: Diagnosis present

## 2018-12-18 DIAGNOSIS — Z9115 Patient's noncompliance with renal dialysis: Secondary | ICD-10-CM | POA: Diagnosis not present

## 2018-12-18 DIAGNOSIS — Y712 Prosthetic and other implants, materials and accessory cardiovascular devices associated with adverse incidents: Secondary | ICD-10-CM | POA: Diagnosis present

## 2018-12-18 DIAGNOSIS — E11649 Type 2 diabetes mellitus with hypoglycemia without coma: Secondary | ICD-10-CM | POA: Diagnosis present

## 2018-12-18 DIAGNOSIS — T82868A Thrombosis of vascular prosthetic devices, implants and grafts, initial encounter: Secondary | ICD-10-CM

## 2018-12-18 HISTORY — PX: TEMPORARY DIALYSIS CATHETER: CATH118312

## 2018-12-18 LAB — APTT: aPTT: 33 seconds (ref 24–36)

## 2018-12-18 LAB — CBC WITH DIFFERENTIAL/PLATELET
Abs Immature Granulocytes: 0.02 10*3/uL (ref 0.00–0.07)
Abs Immature Granulocytes: 0.04 10*3/uL (ref 0.00–0.07)
Basophils Absolute: 0 10*3/uL (ref 0.0–0.1)
Basophils Absolute: 0.1 10*3/uL (ref 0.0–0.1)
Basophils Relative: 1 %
Basophils Relative: 1 %
Eosinophils Absolute: 0.1 10*3/uL (ref 0.0–0.5)
Eosinophils Absolute: 0.2 10*3/uL (ref 0.0–0.5)
Eosinophils Relative: 1 %
Eosinophils Relative: 2 %
HCT: 30.4 % — ABNORMAL LOW (ref 39.0–52.0)
HCT: 34.9 % — ABNORMAL LOW (ref 39.0–52.0)
Hemoglobin: 11.1 g/dL — ABNORMAL LOW (ref 13.0–17.0)
Hemoglobin: 9.8 g/dL — ABNORMAL LOW (ref 13.0–17.0)
Immature Granulocytes: 0 %
Immature Granulocytes: 0 %
Lymphocytes Relative: 11 %
Lymphocytes Relative: 15 %
Lymphs Abs: 0.8 10*3/uL (ref 0.7–4.0)
Lymphs Abs: 1.1 10*3/uL (ref 0.7–4.0)
MCH: 31 pg (ref 26.0–34.0)
MCH: 31.2 pg (ref 26.0–34.0)
MCHC: 31.8 g/dL (ref 30.0–36.0)
MCHC: 32.2 g/dL (ref 30.0–36.0)
MCV: 96.8 fL (ref 80.0–100.0)
MCV: 97.5 fL (ref 80.0–100.0)
Monocytes Absolute: 0.3 10*3/uL (ref 0.1–1.0)
Monocytes Absolute: 0.5 10*3/uL (ref 0.1–1.0)
Monocytes Relative: 6 %
Monocytes Relative: 6 %
Neutro Abs: 4.2 10*3/uL (ref 1.7–7.7)
Neutro Abs: 7.7 10*3/uL (ref 1.7–7.7)
Neutrophils Relative %: 77 %
Neutrophils Relative %: 80 %
Platelets: 140 10*3/uL — ABNORMAL LOW (ref 150–400)
Platelets: 142 10*3/uL — ABNORMAL LOW (ref 150–400)
RBC: 3.14 MIL/uL — ABNORMAL LOW (ref 4.22–5.81)
RBC: 3.58 MIL/uL — ABNORMAL LOW (ref 4.22–5.81)
RDW: 14.6 % (ref 11.5–15.5)
RDW: 14.7 % (ref 11.5–15.5)
WBC: 5.5 10*3/uL (ref 4.0–10.5)
WBC: 9.6 10*3/uL (ref 4.0–10.5)
nRBC: 0 % (ref 0.0–0.2)
nRBC: 0 % (ref 0.0–0.2)

## 2018-12-18 LAB — GLUCOSE, CAPILLARY
Glucose-Capillary: 94 mg/dL (ref 70–99)
Glucose-Capillary: 40 mg/dL — CL (ref 70–99)
Glucose-Capillary: 164 mg/dL — ABNORMAL HIGH (ref 70–99)

## 2018-12-18 LAB — COMPREHENSIVE METABOLIC PANEL
ALT: 30 U/L (ref 0–44)
AST: 23 U/L (ref 15–41)
Albumin: 4.5 g/dL (ref 3.5–5.0)
Alkaline Phosphatase: 111 U/L (ref 38–126)
Anion gap: 19 — ABNORMAL HIGH (ref 5–15)
BUN: 94 mg/dL — ABNORMAL HIGH (ref 6–20)
CO2: 24 mmol/L (ref 22–32)
Calcium: 8.2 mg/dL — ABNORMAL LOW (ref 8.9–10.3)
Chloride: 100 mmol/L (ref 98–111)
Creatinine, Ser: 16.61 mg/dL — ABNORMAL HIGH (ref 0.61–1.24)
GFR calc Af Amer: 4 mL/min — ABNORMAL LOW (ref >60)
GFR calc non Af Amer: 3 mL/min — ABNORMAL LOW (ref >60)
Glucose, Bld: 124 mg/dL — ABNORMAL HIGH (ref 70–99)
Potassium: 6.8 mmol/L (ref 3.5–5.1)
Sodium: 143 mmol/L (ref 135–145)
Total Bilirubin: 0.8 mg/dL (ref 0.3–1.2)
Total Protein: 8 g/dL (ref 6.5–8.1)

## 2018-12-18 LAB — TROPONIN I (HIGH SENSITIVITY)
Troponin I (High Sensitivity): 270 ng/L (ref <18)
Troponin I (High Sensitivity): 36 ng/L — ABNORMAL HIGH (ref <18)

## 2018-12-18 LAB — BRAIN NATRIURETIC PEPTIDE: B Natriuretic Peptide: 1555 pg/mL — ABNORMAL HIGH (ref 0.0–100.0)

## 2018-12-18 LAB — HEPARIN LEVEL (UNFRACTIONATED): Heparin Unfractionated: 0.33 IU/mL (ref 0.30–0.70)

## 2018-12-18 LAB — MRSA PCR SCREENING: MRSA by PCR: NEGATIVE

## 2018-12-18 LAB — PROTIME-INR
INR: 1.1 (ref 0.8–1.2)
Prothrombin Time: 13.8 seconds (ref 11.4–15.2)

## 2018-12-18 LAB — POTASSIUM: Potassium: 6 mmol/L — ABNORMAL HIGH (ref 3.5–5.1)

## 2018-12-18 LAB — MAGNESIUM: Magnesium: 3 mg/dL — ABNORMAL HIGH (ref 1.7–2.4)

## 2018-12-18 LAB — TSH: TSH: 1.181 u[IU]/mL (ref 0.350–4.500)

## 2018-12-18 LAB — SARS CORONAVIRUS 2 BY RT PCR (HOSPITAL ORDER, PERFORMED IN ~~LOC~~ HOSPITAL LAB): SARS Coronavirus 2: NEGATIVE

## 2018-12-18 SURGERY — TEMPORARY DIALYSIS CATHETER
Anesthesia: Moderate Sedation | Laterality: Left

## 2018-12-18 MED ORDER — SODIUM CHLORIDE 0.9 % IV SOLN: 100.0000 mL | INTRAVENOUS | Status: DC | PRN

## 2018-12-18 MED ORDER — PENTAFLUOROPROP-TETRAFLUOROETH EX AERO
1.0000 "application " | INHALATION_SPRAY | CUTANEOUS | Status: DC | PRN
  Filled 2018-12-18: qty 30

## 2018-12-18 MED ORDER — INSULIN ASPART 100 UNIT/ML ~~LOC~~ SOLN
5.0000 [IU] | Freq: Once | SUBCUTANEOUS | Status: AC
  Administered 2018-12-18: 5 [IU] via INTRAVENOUS
  Filled 2018-12-18: qty 1

## 2018-12-18 MED ORDER — LIDOCAINE HCL (PF) 1 % IJ SOLN
INTRAMUSCULAR | Status: DC | PRN
  Administered 2018-12-18: 5 mL via INTRADERMAL

## 2018-12-18 MED ORDER — LIDOCAINE HCL 1 % IJ SOLN
5.0000 mL | Freq: Once | INTRAMUSCULAR | Status: DC
  Filled 2018-12-18: qty 10

## 2018-12-18 MED ORDER — CHLORHEXIDINE GLUCONATE CLOTH 2 % EX PADS
6.0000 | MEDICATED_PAD | Freq: Every day | CUTANEOUS | Status: DC
  Administered 2018-12-19 – 2018-12-27 (×7): 6 via TOPICAL
  Filled 2018-12-18: qty 6

## 2018-12-18 MED ORDER — ALBUTEROL SULFATE (2.5 MG/3ML) 0.083% IN NEBU
5.0000 mg | INHALATION_SOLUTION | Freq: Once | RESPIRATORY_TRACT | Status: AC
  Administered 2018-12-18: 01:00:00 5 mg via RESPIRATORY_TRACT
  Filled 2018-12-18: qty 6

## 2018-12-18 MED ORDER — ACETAMINOPHEN 650 MG RE SUPP: 650.0000 mg | Freq: Four times a day (QID) | RECTAL | Status: DC | PRN

## 2018-12-18 MED ORDER — INSULIN ASPART 100 UNIT/ML IV SOLN
10.0000 [IU] | Freq: Once | INTRAVENOUS | Status: AC
  Administered 2018-12-18: 10 [IU] via INTRAVENOUS
  Filled 2018-12-18: qty 0.1

## 2018-12-18 MED ORDER — ALTEPLASE 2 MG IJ SOLR: 2.0000 mg | Freq: Once | INTRAMUSCULAR | Status: DC | PRN

## 2018-12-18 MED ORDER — LIDOCAINE HCL (PF) 1 % IJ SOLN
5.0000 mL | INTRAMUSCULAR | Status: DC | PRN
  Filled 2018-12-18: qty 5

## 2018-12-18 MED ORDER — FENTANYL CITRATE (PF) 100 MCG/2ML IJ SOLN
50.0000 ug | INTRAMUSCULAR | Status: AC
  Administered 2018-12-18: 50 ug via INTRAVENOUS

## 2018-12-18 MED ORDER — LORAZEPAM 2 MG/ML IJ SOLN
1.0000 mg | Freq: Once | INTRAMUSCULAR | Status: AC
  Administered 2018-12-18: 01:00:00 1 mg via INTRAVENOUS

## 2018-12-18 MED ORDER — DEXTROSE 50 % IV SOLN
INTRAVENOUS | Status: AC
  Administered 2018-12-18: 50 mL via INTRAVENOUS
  Filled 2018-12-18: qty 50

## 2018-12-18 MED ORDER — FENTANYL CITRATE (PF) 100 MCG/2ML IJ SOLN
50.0000 ug | INTRAMUSCULAR | Status: DC | PRN
  Administered 2018-12-18 – 2018-12-19 (×4): 50 ug via INTRAVENOUS
  Filled 2018-12-18 (×4): qty 2

## 2018-12-18 MED ORDER — FENTANYL CITRATE (PF) 100 MCG/2ML IJ SOLN: 50.0000 ug | Freq: Once | INTRAMUSCULAR | Status: DC

## 2018-12-18 MED ORDER — HEPARIN (PORCINE) 25000 UT/250ML-% IV SOLN: 700.0000 [IU]/h | INTRAVENOUS | Status: DC

## 2018-12-18 MED ORDER — DOCUSATE SODIUM 100 MG PO CAPS
100.0000 mg | ORAL_CAPSULE | Freq: Two times a day (BID) | ORAL | Status: DC
  Administered 2018-12-18 – 2018-12-27 (×12): 100 mg via ORAL
  Filled 2018-12-18 (×14): qty 1

## 2018-12-18 MED ORDER — LORAZEPAM 2 MG/ML IJ SOLN
INTRAMUSCULAR | Status: AC
  Administered 2018-12-18: 01:00:00 1 mg via INTRAVENOUS
  Filled 2018-12-18: qty 1

## 2018-12-18 MED ORDER — ONDANSETRON HCL 4 MG/2ML IJ SOLN
4.0000 mg | Freq: Four times a day (QID) | INTRAMUSCULAR | Status: DC | PRN
  Administered 2018-12-18 – 2018-12-26 (×4): 4 mg via INTRAVENOUS
  Filled 2018-12-18 (×5): qty 2

## 2018-12-18 MED ORDER — HEPARIN SODIUM (PORCINE) 1000 UNIT/ML DIALYSIS: 1000.0000 [IU] | INTRAMUSCULAR | Status: DC | PRN

## 2018-12-18 MED ORDER — HEPARIN SODIUM (PORCINE) 1000 UNIT/ML IJ SOLN
1000.0000 [IU] | INTRAMUSCULAR | Status: DC | PRN
  Filled 2018-12-18: qty 6

## 2018-12-18 MED ORDER — ACETAMINOPHEN 325 MG PO TABS
650.0000 mg | ORAL_TABLET | Freq: Four times a day (QID) | ORAL | Status: DC | PRN
  Administered 2018-12-23 – 2018-12-25 (×3): 650 mg via ORAL
  Filled 2018-12-18 (×3): qty 2

## 2018-12-18 MED ORDER — LIDOCAINE-PRILOCAINE 2.5-2.5 % EX CREA
1.0000 "application " | TOPICAL_CREAM | CUTANEOUS | Status: DC | PRN
  Filled 2018-12-18: qty 5

## 2018-12-18 MED ORDER — HEPARIN (PORCINE) 25000 UT/250ML-% IV SOLN
700.0000 [IU]/h | INTRAVENOUS | Status: DC
  Administered 2018-12-18: 700 [IU]/h via INTRAVENOUS
  Filled 2018-12-18: qty 250

## 2018-12-18 MED ORDER — DEXTROSE 50 % IV SOLN
1.0000 | Freq: Once | INTRAVENOUS | Status: AC
  Administered 2018-12-18: 50 mL via INTRAVENOUS
  Filled 2018-12-18: qty 50

## 2018-12-18 MED ORDER — CALCIUM GLUCONATE-NACL 1-0.675 GM/50ML-% IV SOLN
1.0000 g | Freq: Once | INTRAVENOUS | Status: AC
  Administered 2018-12-18: 1000 mg via INTRAVENOUS
  Filled 2018-12-18: qty 50

## 2018-12-18 MED ORDER — HEPARIN SODIUM (PORCINE) 5000 UNIT/ML IJ SOLN: 5000.0000 [IU] | Freq: Three times a day (TID) | INTRAMUSCULAR | Status: DC

## 2018-12-18 MED ORDER — HEPARIN BOLUS VIA INFUSION
3600.0000 [IU] | Freq: Once | INTRAVENOUS | Status: DC
  Filled 2018-12-18: qty 3600

## 2018-12-18 MED ORDER — ONDANSETRON HCL 4 MG PO TABS: 4.0000 mg | ORAL_TABLET | Freq: Four times a day (QID) | ORAL | Status: DC | PRN

## 2018-12-18 MED ORDER — PROMETHAZINE HCL 25 MG/ML IJ SOLN
12.5000 mg | Freq: Once | INTRAMUSCULAR | Status: AC
  Administered 2018-12-18: 12.5 mg via INTRAVENOUS
  Filled 2018-12-18: qty 1

## 2018-12-18 MED ORDER — SODIUM BICARBONATE 8.4 % IV SOLN
50.0000 meq | Freq: Once | INTRAVENOUS | Status: AC
  Administered 2018-12-18: 50 meq via INTRAVENOUS
  Filled 2018-12-18: qty 50

## 2018-12-18 MED ORDER — DEXMEDETOMIDINE HCL IN NACL 400 MCG/100ML IV SOLN
0.4000 ug/kg/h | INTRAVENOUS | Status: DC
  Administered 2018-12-18: 0.6 ug/kg/h via INTRAVENOUS
  Filled 2018-12-18: qty 100

## 2018-12-18 MED ORDER — DEXTROSE 50 % IV SOLN
1.0000 | INTRAVENOUS | Status: AC
  Administered 2018-12-18: 23:00:00 50 mL via INTRAVENOUS

## 2018-12-18 SURGICAL SUPPLY — 4 items
CANNULA 5F STIFF (CANNULA) ×2 IMPLANT
COVER PROBE U/S 5X48 (MISCELLANEOUS) ×2 IMPLANT
GUIDEWIRE AMPLATZ SHORT (WIRE) ×2 IMPLANT
KIT DIALYSIS CATH TRI 30X13 (CATHETERS) ×2 IMPLANT

## 2018-12-18 NOTE — Procedures (Signed)
Hemodialysis Catheter Insertion Procedure Note Ralph Lucero DK:[id number] 08-16-1975  Procedure: Insertion of Hemodialysis Catheter Indications: Dialysis Access   Procedure Details Consent: Risks of procedure as well as the alternatives and risks of each were explained to the (patient/caregiver).  Consent for procedure obtained. Time Out: Verified patient identification, verified procedure, site/side was marked, verified correct patient position, special equipment/implants available, medications/allergies/relevent history reviewed, required imaging and test results available.  Performed  Maximum sterile technique was used including antiseptics, cap, gloves, gown, hand hygiene, mask and sheet. Skin prep: Chlorhexidine; local anesthetic administered Triple lumen hemodialysis catheter was inserted into right femoral vein due to patient being a dialysis patient using the Seldinger technique.  Evaluation Utilized ultrasound to place temporary right femoral dual lumen hemodialysis catheter, however pt agitated throughout procedure moving bilateral lower extremities despite precedex gtt and multiple attempts at redirection.  Successfully punctured right femoral vein and inserted guidewire, utilized ultrasound to verify guidewire inserted in femoral vein.  During dilation of vessel pt continued to bend right lower extremity frequently at the knee and right groin.  Threaded hemodialysis catheter, however unable to flush ports.  Therefore, removed temporary dialysis catheter upon removal catheter kinked.  At incision site moderate size hematoma present.  Direct pressure applied for a total of 22 minutes and bleeding resolved, PAD applied to the right groin 40 ml of air inserted in the PAD.  Repeat hemoglobin 9.8,  hematocrit 30.4, PT 13.8, and INR 1.1.  Reported given to ICU Intensivist Dr. Galen Daft.  Will continue to monitor and assess pt   Marda Stalker, [place] Pager  [phone] (please enter 7 digits) [place] Pager [phone] (please enter 7 digits)

## 2018-12-18 NOTE — Progress Notes (Signed)
Post HD [facility]    12/18/18 1947  Hand-Off documentation  Report given to (Full Name) Eulah Citizen, RN   Report received from (Full Name) Beatris Ship, RN   Vital Signs  Temp 98.3 F (36.8 C)  Pulse Rate 64  Pulse Rate Source Monitor  Resp 19  BP 97/64  BP Location Right Arm  BP Method Automatic  Patient Position (if appropriate) Lying  Oxygen Therapy  SpO2 100 %  O2 Device Room Air  Pain Assessment  Pain Scale 0-10  Pain Score 0  Dialysis Weight  Weight 60.3 kg  Type of Weight Post-Dialysis  Post-Hemodialysis Assessment  Rinseback Volume (mL) 250 mL  KECN 63.8 V  Dialyzer Clearance Lightly streaked  Duration of HD Treatment -hour(s) 3.5 hour(s)  Hemodialysis Intake (mL) 500 mL  UF Total -Machine (mL) 727 mL  Net UF (mL) 227 mL  Tolerated HD Treatment Yes  Hemodialysis Catheter Left Femoral vein Triple lumen Temporary (Non-Tunneled)  Placement Date/Time: 12/18/18 1222   Placed prior to admission: No  Time Out: Correct patient;Correct procedure;Correct site  Maximum sterile barrier precautions: Hand hygiene;Sterile gown;Sterile gloves;Cap;Mask;Large sterile sheet  Site Prep: Chlorh...  Site Condition No complications  Blue Lumen Status Heparin locked  Red Lumen Status Heparin locked  Post treatment catheter status Capped and Clamped

## 2018-12-18 NOTE — Progress Notes (Signed)
Pre HD Tx   12/18/18 1613  Hand-Off documentation  Report given to (Full Name) Beatris Ship, RN   Report received from (Full Name) Candie Chroman, RN   Vital Signs  Temp 98.4 F (36.9 C)  Temp Source Oral  Pulse Rate 69  Pulse Rate Source Monitor  Resp 14  BP 112/85  BP Location Right Arm  BP Method Automatic  Patient Position (if appropriate) Lying  Oxygen Therapy  SpO2 98 %  O2 Device Room Air  Pulse Oximetry Type Continuous  Pain Assessment  Pain Scale 0-10  Pain Score 0  Dialysis Weight  Weight 60.1 kg  Type of Weight Pre-Dialysis  Time-Out for Hemodialysis  What Procedure? HD   Pt Identifiers(min of two) First/Last Name;MRN/Account#  Correct Site? Yes  Correct Side? Yes  Correct Procedure? Yes  Consents Verified? Yes  Rad Studies Available? Yes  Safety Precautions Reviewed? Yes  Engineer, civil (consulting) Number 5  Station Number 4  UF/Alarm Test Passed  Conductivity: Meter 13.8  Conductivity: Machine  13.8  pH 7.4  Reverse Osmosis Main  Normal Saline Lot Number [id number]  Dialyzer Lot Number [id number]  Disposable Set Lot Number [id number]  Machine Temperature 98.6 F (37 C)  Musician and Audible Yes  Blood Lines Intact and Secured Yes  Pre Treatment Patient Checks  Vascular access used during treatment Catheter  HD catheter dressing before treatment WDL  Hepatitis B Surface Antigen Results Negative  Date Hepatitis B Surface Antigen Drawn 12/03/09  Hepatitis B Surface Antibody 294  Date Hepatitis B Surface Antibody Drawn 01/23/18  Hemodialysis Consent Verified Yes  Hemodialysis Standing Orders Initiated Yes  ECG (Telemetry) Monitor On Yes  Prime Ordered Normal Saline  Length of  DialysisTreatment -hour(s) 3.5 Hour(s)  Dialysis Treatment Comments Na 140 (Verbal order change, 2K bath only , UF as tolerated )  Dialyzer Elisio 17H NR  Dialysate 2K;2.5 Ca  Dialysis Anticoagulant None  Dialysate Flow Ordered 600 (Verbal Order Change by MD )  Blood Flow  Rate Ordered 300 mL/min (Verbal order change by MD New Temp cath placement )  Ultrafiltration Goal  (Verbal order change, UF as tolerated )  Dialysis Blood Pressure Support Ordered Normal Saline  Education / Care Plan  Dialysis Education Provided Yes  Documented Education in Care Plan Yes  Hemodialysis Catheter Left Femoral vein Triple lumen Temporary (Non-Tunneled)  Placement Date/Time: 12/18/18 1222   Placed prior to admission: No  Time Out: Correct patient;Correct procedure;Correct site  Maximum sterile barrier precautions: Hand hygiene;Sterile gown;Sterile gloves;Cap;Mask;Large sterile sheet  Site Prep: Chlorh...  Site Condition No complications  Blue Lumen Status Blood return noted  Red Lumen Status Blood return noted  Purple Lumen Status Capped (Central line)  Dressing Type Biopatch;Occlusive  Dressing Status Clean;Dry;Intact

## 2018-12-18 NOTE — Progress Notes (Signed)
HD Tx completed, tolerated well.    12/18/18 1945  Vital Signs  Pulse Rate 64  Pulse Rate Source Monitor  Resp 18  BP 101/67  BP Location Right Arm  BP Method Automatic  Patient Position (if appropriate) Lying  Oxygen Therapy  SpO2 99 %  O2 Device Room Air  During Hemodialysis Assessment  HD Safety Checks Performed Yes  KECN 63.8 KECN  Dialysis Fluid Bolus Normal Saline  Bolus Amount (mL) 250 mL  Intra-Hemodialysis Comments Tx completed;Tolerated well

## 2018-12-18 NOTE — H&P (Signed)
Ralph Lucero is an 43 y.o. male.   Chief Complaint: Chest pain HPI: The patient with past medical history of hypertension and ESRD on hemodialysis presents to the emergency department complaining of chest pain or shortness of breath.  The patient reports that his symptoms began at least 12 hours prior to ED evaluation.  He has had worsening dyspnea since being unable to complete dialysis day before yesterday due to inability to consistently access his fistula.  Oxygen saturations upon arrival were within the 90s prior to adding supplemental oxygen via nasal cannula.  Chest x-ray showed interstitial edema.  He also complained of chest pain that seem to be associated with worsening respiratory status.  Nephrology was notified to attempt to access fistula and start dialysis prior to the emergency department staff called the hospitalist service for admission.  Past Medical History:  Diagnosis Date  . ESRD (end stage renal disease) on dialysis ([place])   . Hypertension   . Renal disorder     History reviewed. No pertinent surgical history.  AV fistula placement  History reviewed. No pertinent family history.  Diabetes and hypertension  Social History:  reports that he has never smoked. He has never used smokeless tobacco. No history on file for alcohol and drug.  Allergies: Not on File  Medications Prior to Admission  Medication Sig Dispense Refill  . B Complex-C-Zn-Folic Acid (DIALYVITE/ZINC) TABS Take 1 tablet by mouth every evening.      Results for orders placed or performed during the hospital encounter of 12/17/18 (from the past 48 hour(s))  CBC with Differential     Status: Abnormal   Collection Time: 12/17/18 11:48 PM  Result Value Ref Range   WBC 9.6 4.0 - 10.5 K/uL   RBC 3.58 (L) 4.22 - 5.81 MIL/uL   Hemoglobin 11.1 (L) 13.0 - 17.0 g/dL   HCT 34.9 (L) 39.0 - 52.0 %   MCV 97.5 80.0 - 100.0 fL   MCH 31.0 26.0 - 34.0 pg   MCHC 31.8 30.0 - 36.0 g/dL   RDW 14.6 11.5 - 15.5 %    Platelets 140 (L) 150 - 400 K/uL   nRBC 0.0 0.0 - 0.2 %   Neutrophils Relative % 80 %   Neutro Abs 7.7 1.7 - 7.7 K/uL   Lymphocytes Relative 11 %   Lymphs Abs 1.1 0.7 - 4.0 K/uL   Monocytes Relative 6 %   Monocytes Absolute 0.5 0.1 - 1.0 K/uL   Eosinophils Relative 2 %   Eosinophils Absolute 0.2 0.0 - 0.5 K/uL   Basophils Relative 1 %   Basophils Absolute 0.1 0.0 - 0.1 K/uL   Immature Granulocytes 0 %   Abs Immature Granulocytes 0.04 0.00 - 0.07 K/uL    Comment: Performed at [facility], [address] [place], [place] [address]  Comprehensive metabolic panel     Status: Abnormal   Collection Time: 12/17/18 11:48 PM  Result Value Ref Range   Sodium 143 135 - 145 mmol/L   Potassium 6.8 (HH) 3.5 - 5.1 mmol/L    Comment: CRITICAL RESULT CALLED TO, READ BACK BY AND VERIFIED WITH DANIEL LEWIS 12/18/2018 1251 SJL    Chloride 100 98 - 111 mmol/L   CO2 24 22 - 32 mmol/L   Glucose, Bld 124 (H) 70 - 99 mg/dL   BUN 94 (H) 6 - 20 mg/dL   Creatinine, Ser 16.61 (H) 0.61 - 1.24 mg/dL   Calcium 8.2 (L) 8.9 - 10.3 mg/dL   Total Protein 8.0  6.5 - 8.1 g/dL   Albumin 4.5 3.5 - 5.0 g/dL   AST 23 15 - 41 U/L   ALT 30 0 - 44 U/L   Alkaline Phosphatase 111 38 - 126 U/L   Total Bilirubin 0.8 0.3 - 1.2 mg/dL   GFR calc non Af Amer 3 (L) >60 mL/min   GFR calc Af Amer 4 (L) >60 mL/min   Anion gap 19 (H) 5 - 15    Comment: Performed at [facility], [address] [place], [place] [address]  Magnesium     Status: Abnormal   Collection Time: 12/17/18 11:48 PM  Result Value Ref Range   Magnesium 3.0 (H) 1.7 - 2.4 mg/dL    Comment: Performed at [facility], [place], [place] [address]  Troponin I (High Sensitivity)     Status: Abnormal   Collection Time: 12/17/18 11:48 PM  Result Value Ref Range   Troponin I (High Sensitivity) 36 (H) <18 ng/L    Comment: (NOTE) Elevated high sensitivity troponin I (hsTnI) values and significant  changes across  serial measurements may suggest ACS but many other  chronic and acute conditions are known to elevate hsTnI results.  Refer to the "Links" section for chest pain algorithms and additional  guidance. Performed at [facility], [address] [place],  [address]   Brain natriuretic peptide     Status: Abnormal   Collection Time: 12/17/18 11:48 PM  Result Value Ref Range   B Natriuretic Peptide 1,555.0 (H) 0.0 - 100.0 pg/mL    Comment: Performed at [facility], [address] [place],  [address]  SARS Coronavirus 2 [facility] order, Performed in [facility] hospital lab) Nasopharyngeal Nasopharyngeal Swab     Status: None   Collection Time: 12/18/18 12:31 AM   Specimen: Nasopharyngeal Swab  Result Value Ref Range   SARS Coronavirus 2 NEGATIVE NEGATIVE    Comment: (NOTE) If result is NEGATIVE SARS-CoV-2 target nucleic acids are NOT DETECTED. The SARS-CoV-2 RNA is generally detectable in upper and lower  respiratory specimens during the acute phase of infection. The lowest  concentration of SARS-CoV-2 viral copies this assay can detect is 250  copies / mL. A negative result does not preclude SARS-CoV-2 infection  and should not be used as the sole basis for treatment or other  patient management decisions.  A negative result may occur with  improper specimen collection / handling, submission of specimen other  than nasopharyngeal swab, presence of viral mutation(s) within the  areas targeted by this assay, and inadequate number of viral copies  (<250 copies / mL). A negative result must be combined with clinical  observations, patient history, and epidemiological information. If result is POSITIVE SARS-CoV-2 target nucleic acids are DETECTED. The SARS-CoV-2 RNA is generally detectable in upper and lower  respiratory specimens dur ing the acute phase of infection.  Positive  results are indicative of active infection with SARS-CoV-2.  Clinical   correlation with patient history and other diagnostic information is  necessary to determine patient infection status.  Positive results do  not rule out bacterial infection or co-infection with other viruses. If result is PRESUMPTIVE POSTIVE SARS-CoV-2 nucleic acids MAY BE PRESENT.   A presumptive positive result was obtained on the submitted specimen  and confirmed on repeat testing.  While 2019 novel coronavirus  (SARS-CoV-2) nucleic acids may be present in the submitted sample  additional confirmatory testing may be necessary for epidemiological  and / or  clinical management purposes  to differentiate between  SARS-CoV-2 and other Sarbecovirus currently known to infect humans.  If clinically indicated additional testing with an alternate test  methodology [phone]) is advised. The SARS-CoV-2 RNA is generally  detectable in upper and lower respiratory sp ecimens during the acute  phase of infection. The expected result is Negative. Fact Sheet for Patients:  StrictlyIdeas.no Fact Sheet for Healthcare Providers: BankingDealers.co.za This test is not yet approved or cleared by the [place] FDA and has been authorized for detection and/or diagnosis of SARS-CoV-2 by FDA under an Emergency Use Authorization (EUA).  This EUA will remain in effect (meaning this test can be used) for the duration of the COVID-19 declaration under Section 564(b)(1) of the Act, 21 U.S.C. section 360bbb-3(b)(1), unless the authorization is terminated or revoked sooner. Performed at [facility], [address] [place], [place] [address]    Dg Chest Portable 1 View  Result Date: 12/18/2018 CLINICAL DATA:  Shortness of breath EXAM: PORTABLE CHEST 1 VIEW COMPARISON:  None. FINDINGS: There is mild cardiomegaly. Pulmonary vascular congestion and increased interstitial markings seen throughout both lungs. No acute osseous abnormality. IMPRESSION:  Cardiomegaly and interstitial edema. Electronically Signed   By: Prudencio Pair M.D.   On: 12/18/2018 00:26    Review of Systems  Constitutional: Negative for chills and fever.  HENT: Negative for sore throat and tinnitus.   Eyes: Negative for blurred vision and redness.  Respiratory: Positive for shortness of breath. Negative for cough.   Cardiovascular: Positive for chest pain. Negative for palpitations, orthopnea and PND.  Gastrointestinal: Negative for abdominal pain, diarrhea, nausea and vomiting.  Genitourinary: Negative for dysuria, frequency and urgency.  Musculoskeletal: Negative for joint pain and myalgias.  Skin: Negative for rash.       No lesions  Neurological: Negative for speech change, focal weakness and weakness.  Endo/Heme/Allergies: Does not bruise/bleed easily.       No temperature intolerance  Psychiatric/Behavioral: Negative for depression and suicidal ideas.    Blood pressure (!) 151/105, pulse 78, temperature 97.9 F (36.6 C), temperature source Oral, resp. rate (!) 30, height 6' (1.829 m), weight 60.1 kg, SpO2 99 %. Physical Exam  Vitals reviewed. Constitutional: He is oriented to person, place, and time. He appears well-developed and well-nourished. No distress.  HENT:  Head: Normocephalic and atraumatic.  Mouth/Throat: Oropharynx is clear and moist.  Eyes: Pupils are equal, round, and reactive to light. Conjunctivae and EOM are normal.  Neck: Normal range of motion. Neck supple. No JVD present. No tracheal deviation present. No thyromegaly present.  Cardiovascular: Normal rate, regular rhythm and normal heart sounds. Exam reveals no gallop and no friction rub.  No murmur heard. Respiratory: He is in respiratory distress. He has rales.  GI: Soft. Bowel sounds are normal. He exhibits no distension. There is no abdominal tenderness.  Genitourinary:    Genitourinary Comments: Deferred   Musculoskeletal: Normal range of motion.        General: No edema.   Lymphadenopathy:    He has no cervical adenopathy.  Neurological: He is alert and oriented to person, place, and time. No cranial nerve deficit.  Skin: Skin is warm and dry. No rash noted.  Psychiatric: He has a normal mood and affect. His behavior is normal. Judgment and thought content normal.     Assessment/Plan This is a 43 year old male admitted for acute respiratory failure. 1.  Respiratory failure: Acute; with hypoxemia.  Secondary to pulmonary edema.  The patient is currently  on BiPAP and will need urgent dialysis.  If unable to access fistula the patient will need dialysis catheter placement. 2.  NSTEMI: Initial troponin was mildly elevated and slowly due to work of breathing and poor renal clearance.  However follow-up troponin is within NSTEMI range.  Will coordinate with ICU staff and cardiology for how to proceed. 3.  ESRD: On hemodialysis.  Fluid overload: Secondary to pleat dialysis.  Plan as above.  Consult nephrology.  Plan as above 4.  Hypertension: Uncontrolled. 5.  DVT prophylaxis: Heparin (will discuss therapeutic anticoagulation with cardiology) 6.  GI prophylaxis: None The patient is a full code.  I personally spent 45 minutes in critical care time with this patient.  Harrie Foreman, MD 12/18/2018, 2:54 AM

## 2018-12-18 NOTE — ED Notes (Signed)
Attempted to call report. Placed on hold for over 10 minutes.

## 2018-12-18 NOTE — Progress Notes (Signed)
To Dialysis unit via bed.

## 2018-12-18 NOTE — Consult Note (Addendum)
[facility] Cardiology  CARDIOLOGY CONSULT NOTE  Patient ID: Ralph Lucero MRN: [id number] DOB/AGE: [birth year] 43 y.o.  Admit date: 12/17/2018 Referring Physician Dr. Benjie Karvonen Primary Physician Dr. Holley Raring Primary Cardiologist: None Reason for Consultation Elevated troponin  HPI:  Ralph Lucero is a 43 y.o. with a past medical history of HTN, ESRD on hemodialysis, STEMI in 08/2018 - cardiac cath showed occluded PDA and 3 vessel diffuse small caliber stenosis affecting D1, D2, and OM2, and ischemic cardiomyopathy with EF of 40-45%, who presented to the ED last night for evaluation of chest pain and shortness of breath. The patient had a partial dialysis session on Saturday with only 1 hour of treatment. Since that time he has been experiencing worsening dyspnea with exertion secondary to inability to complete dialysis. Chest pain had begun a few hours prior to his ER evaluation. It was described as a pressure sensation in the middle to right side of his chest without radiation elsewhere. This pain was rated 2/10. He has experienced associated nausea and vomiting x1 prior to arrival.   Upon initial ER evaluation he was in respiratory distress. Labs showed hyperkalemia with K of 6.8. Initial troponin elevated to 36, then 270 on repeat. He was placed on bipap and given calcium carbonate due to concern about possible EKG changes. CXR consistent with interstitial edema.   On current evaluation, he is no longer on bipap. He no longer has any chest discomfort. He does report some continued shortness of breath but significantly improved as compared to last night.   Review of systems complete and found to be negative unless listed above   Past Medical History:  Diagnosis Date  . ESRD (end stage renal disease) on dialysis ([place])   . Hypertension   . Renal disorder     History reviewed. No pertinent surgical history.  Medications Prior to Admission  Medication Sig Dispense Refill Last Dose  . B  Complex-C-Zn-Folic Acid (DIALYVITE/ZINC) TABS Take 1 tablet by mouth every evening.      Social History   Socioeconomic History  . Marital status: Single    Spouse name: Not on file  . Number of children: Not on file  . Years of education: Not on file  . Highest education level: Not on file  Occupational History  . Not on file  Social Needs  . Financial resource strain: Not on file  . Food insecurity    Worry: Not on file    Inability: Not on file  . Transportation needs    Medical: Not on file    Non-medical: Not on file  Tobacco Use  . Smoking status: Never Smoker  . Smokeless tobacco: Never Used  Substance and Sexual Activity  . Alcohol use: Not on file  . Drug use: Not on file  . Sexual activity: Not on file  Lifestyle  . Physical activity    Days per week: Not on file    Minutes per session: Not on file  . Stress: Not on file  Relationships  . Social Herbalist on phone: Not on file    Gets together: Not on file    Attends religious service: Not on file    Active member of club or organization: Not on file    Attends meetings of clubs or organizations: Not on file    Relationship status: Not on file  . Intimate partner violence    Fear of current or ex partner: Not on file    Emotionally abused: Not  on file    Physically abused: Not on file    Forced sexual activity: Not on file  Other Topics Concern  . Not on file  Social History Narrative  . Not on file    History reviewed. No pertinent family history.   Review of systems complete and found to be negative unless listed above     PHYSICAL EXAM  General: Well developed, well nourished, in no acute distress HEENT:  Normocephalic and atramatic Neck:  No JVD.  Lungs: Crackles to anterior lung fields.  Heart: HRRR . Normal S1 and S2 without gallops or murmurs.  Extremities: No clubbing, cyanosis or edema.   Neuro: Alert and oriented X 3. Psych:  Good affect, responds appropriately  Labs:    Lab Results  Component Value Date   WBC 5.5 12/18/2018   HGB 9.8 (L) 12/18/2018   HCT 30.4 (L) 12/18/2018   MCV 96.8 12/18/2018   PLT 142 (L) 12/18/2018    Recent Labs  Lab 12/17/18 2348 12/18/18 0339  NA 143  --   K 6.8* 6.0*  CL 100  --   CO2 24  --   BUN 94*  --   CREATININE 16.61*  --   CALCIUM 8.2*  --   PROT 8.0  --   BILITOT 0.8  --   ALKPHOS 111  --   ALT 30  --   AST 23  --   GLUCOSE 124*  --    No results found for: CKTOTAL, CKMB, CKMBINDEX, TROPONINI No results found for: CHOL No results found for: HDL No results found for: LDLCALC No results found for: TRIG No results found for: CHOLHDL No results found for: LDLDIRECT    Radiology: Dg Chest Portable 1 View  Result Date: 12/18/2018 CLINICAL DATA:  Shortness of breath EXAM: PORTABLE CHEST 1 VIEW COMPARISON:  None. FINDINGS: There is mild cardiomegaly. Pulmonary vascular congestion and increased interstitial markings seen throughout both lungs. No acute osseous abnormality. IMPRESSION: Cardiomegaly and interstitial edema. Electronically Signed   By: Prudencio Pair M.D.   On: 12/18/2018 00:26    ASSESSMENT AND PLAN:  Mr. Denzer is a 43 year old male with a history significant for coronary artery disease with STEMI in 08/2018 secondary to occluded PDA (not amenable to PCI), and diffuse small caliber stenosis in D1, D2, and OM2, also with a history of ESRD on hemodialysis, and HTN, who presented to the ED yesterday in respiratory distress after several days of progressive dyspnea following incomplete and missed dialysis sessions last week. Patient also with reported chest pain. STEMI had been called in the field but discontinued on arrival to the ED after EKG was evaluation by on-call cardiologist at the time. Patient now without any chest pain. Initial high sensitivity troponin was 36, then 270 on repeat. Given known small vessel disease as seen on cath from 08/2018, suspect recent episode of chest pain was also  related to his known small vessel disease. No active chest pain currently, so we will forgo further interventions and continue with medical management.   1. No further cardiac diagnostics or interventions indicated at this time.  2. Continue with dual antiplatelet therapy for with aspirin and brilinta for ~1 year following his STEMI event in 08/2018.  3. Maximize medical therapy as able with beta blocker, statin, and ACE/ARB if appropriate.  4. Close outpatient cardiology follow up    The patient's history and exam findings were discussed with Dr. Nehemiah Massed. The plan was made in conjunction  with Dr. Nehemiah Massed.  Signed: Hilbert Odor PA-C 12/18/2018, 1:07 PM  The patient has been interviewed and examined. I agree with assessment and plan above. Serafina Royals MD [facility]

## 2018-12-18 NOTE — Consult Note (Signed)
ANTICOAGULATION CONSULT NOTE - Initial Consult  Pharmacy Consult for Heparin Infusion Dosing and Monitoring  Indication: chest pain/ACS  Not on File  Patient Measurements: Height: 6' (182.9 cm) Weight: 132 lb 7.9 oz (60.1 kg) IBW/kg (Calculated) : 77.6  Vital Signs: Temp: 97.5 F (36.4 C) (09/08 0500) Temp Source: Oral (09/08 0500) BP: 139/100 (09/08 0500) Pulse Rate: 75 (09/08 0500)  Labs: Recent Labs    12/17/18 2348 12/18/18 0339 12/18/18 0648  HGB 11.1*  --  9.8*  HCT 34.9*  --  30.4*  PLT 140*  --  142*  LABPROT  --   --  13.8  INR  --   --  1.1  CREATININE 16.61*  --   --   TROPONINIHS 36* 270*  --     Estimated Creatinine Clearance: 4.9 mL/min (A) (by C-G formula based on SCr of 16.61 mg/dL (H)).   Medical History: Past Medical History:  Diagnosis Date  . ESRD (end stage renal disease) on dialysis ([place])   . Hypertension   . Renal disorder     Assessment: Pharmacy consulted for heparin infusion dosing and monitoring for ACS/NSTEMI in 43 yo male. Patient has PMH of ESRD On HD and HTN.    Troponin (HS): 36>> 270   Goal of Therapy:  Heparin level 0.3-0.7 units/ml Monitor platelets by anticoagulation protocol: Yes   Plan:  Give 3600 units bolus x 1 Start heparin infusion at 700 units/hr Check anti-Xa level in 8 hours and daily while on heparin Continue to monitor H&H and platelets  Pernell Dupre, PharmD, BCPS Clinical Pharmacist 12/18/2018 7:50 AM

## 2018-12-18 NOTE — ED Notes (Signed)
Date and time results received: 12/18/18 0029 (use smartphrase ".now" to insert current time)  Test: Potassium Critical Value: 6.8  Name of Provider Notified: Isaacs  Orders Received? Or Actions Taken?: Orders Received - See Orders for details

## 2018-12-18 NOTE — Progress Notes (Signed)
Late note 0730 Patient observed to have right femoral hematoma from failed vascath attempt. PAD over site with 40 cc of air present in dome. Right pedal and posterior tibial pulses palpable and right foot is warm to touch. Right groin measures 21 inches with dome in place and inflate. Right upper thigh measures 16 inches. Skin is tight from hematoma and patient complains of pain and nausea. Patient given Insulin,D50, and Sodium Bicarb. per orders. Only a prn for nausea was in patients orders so Dr. Duwayne Heck consulted for pain medication. Orders received. Medicated for pain and nausea at 0843.

## 2018-12-18 NOTE — ED Provider Notes (Addendum)
[facility] Emergency Department Provider Note  ____________________________________________   First MD Initiated Contact with Patient 12/17/18 2353     (approximate)  I have reviewed the triage vital signs and the nursing notes.   HISTORY  Chief Complaint Chest Pain    HPI Ralph Lucero is a 43 y.o. male  Here with SOB, CP. Pt is on TRSa HD. He states he could not sit for his full session on Saturday. Since then, he's had worsening SOB, initially w/ exertion but not constant at rest. He's had a mild cough. Over the past 24 hours, he's developed worsenign SOb along with nausea, vomiting x 2. No blood or bile in emesis. He's had a dull, aching chest pressure as well radiating to his left arm. Sx feel similar to his missed dialysis sessions in the past. eh states he could not finish because the center had a hard time accessing his fistula. Denies any fevers. No known sick contacts. No specific alleviating factors. Sx, particularly SOB, is worse w/ movement, lying flat.       Past Medical History:  Diagnosis Date  . ESRD (end stage renal disease) on dialysis ([place])   . Hypertension   . Renal disorder     There are no active problems to display for this patient.   History reviewed. No pertinent surgical history.  Prior to Admission medications   Medication Sig Start Date End Date Taking? Authorizing Provider  B Complex-C-Zn-Folic Acid (DIALYVITE/ZINC) TABS Take 1 tablet by mouth every evening. 10/18/18   [provider]    Allergies Patient has no allergy information on record.  History reviewed. No pertinent family history.  Social History Social History   Tobacco Use  . Smoking status: Never Smoker  . Smokeless tobacco: Never Used  Substance Use Topics  . Alcohol use: Not on file  . Drug use: Not on file    Review of Systems  Review of Systems  Constitutional: Positive for fatigue. Negative for chills and fever.  HENT:  Negative for sore throat.   Respiratory: Positive for cough and shortness of breath.   Cardiovascular: Positive for leg swelling. Negative for chest pain.  Gastrointestinal: Positive for nausea and vomiting. Negative for abdominal pain.  Genitourinary: Negative for flank pain.  Musculoskeletal: Negative for neck pain.  Skin: Negative for rash and wound.  Allergic/Immunologic: Negative for immunocompromised state.  Neurological: Positive for weakness. Negative for numbness.  Hematological: Does not bruise/bleed easily.     ____________________________________________  PHYSICAL EXAM:      VITAL SIGNS: ED Triage Vitals  Enc Vitals Group     BP 12/17/18 2357 (!) 139/95     Pulse Rate 12/17/18 2357 82     Resp 12/17/18 2357 (!) 27     Temp 12/17/18 2357 97.9 F (36.6 C)     Temp Source 12/17/18 2357 Oral     SpO2 12/17/18 2356 96 %     Weight 12/17/18 2359 132 lb 7.9 oz (60.1 kg)     Height 12/17/18 2359 6' (1.829 m)     Head Circumference --      Peak Flow --      Pain Score 12/17/18 2359 1     Pain Loc --      Pain Edu? --      Excl. in [place]? --      Physical Exam Vitals signs and nursing note reviewed.  Constitutional:      General: He is in acute distress.  Appearance: He is well-developed. He is ill-appearing.  HENT:     Head: Normocephalic and atraumatic.  Eyes:     Conjunctiva/sclera: Conjunctivae normal.  Neck:     Musculoskeletal: Neck supple.  Cardiovascular:     Rate and Rhythm: Normal rate and regular rhythm.     Heart sounds: Normal heart sounds. No murmur. No friction rub.  Pulmonary:     Effort: Pulmonary effort is normal. No respiratory distress.     Breath sounds: Examination of the right-middle field reveals rales. Examination of the left-middle field reveals rales. Examination of the right-lower field reveals rales. Examination of the left-lower field reveals rales. Decreased breath sounds and rales present. No wheezing.  Abdominal:     General:  There is no distension.     Palpations: Abdomen is soft.     Tenderness: There is no abdominal tenderness.  Skin:    General: Skin is warm.     Capillary Refill: Capillary refill takes less than 2 seconds.  Neurological:     Mental Status: He is alert and oriented to person, place, and time.     Motor: No abnormal muscle tone.       ____________________________________________   LABS (all labs ordered are listed, but only abnormal results are displayed)  Labs Reviewed  CBC WITH DIFFERENTIAL/PLATELET - Abnormal; Notable for the following components:      Result Value   RBC 3.58 (*)    Hemoglobin 11.1 (*)    HCT 34.9 (*)    Platelets 140 (*)    All other components within normal limits  COMPREHENSIVE METABOLIC PANEL - Abnormal; Notable for the following components:   Potassium 6.8 (*)    Glucose, Bld 124 (*)    BUN 94 (*)    Creatinine, Ser 16.61 (*)    Calcium 8.2 (*)    GFR calc non Af Amer 3 (*)    GFR calc Af Amer 4 (*)    Anion gap 19 (*)    All other components within normal limits  MAGNESIUM - Abnormal; Notable for the following components:   Magnesium 3.0 (*)    All other components within normal limits  BRAIN NATRIURETIC PEPTIDE - Abnormal; Notable for the following components:   B Natriuretic Peptide 1,555.0 (*)    All other components within normal limits  TROPONIN I (HIGH SENSITIVITY) - Abnormal; Notable for the following components:   Troponin I (High Sensitivity) 36 (*)    All other components within normal limits  SARS CORONAVIRUS 2 (HOSPITAL ORDER, [facility])  TROPONIN I (HIGH SENSITIVITY)    ____________________________________________  EKG: Sinus rhythm, ventricular rate 92.  Diffuse peaking of T waves, likely old inferior infarct noted.  QTc 521. ________________________________________  RADIOLOGY All imaging, including plain films, CT scans, and ultrasounds, independently reviewed by me, and interpretations confirmed  via formal radiology reads.  ED MD interpretation:   Chest x-ray: Interstitial edema  Official radiology report(s): Dg Chest Portable 1 View  Result Date: 12/18/2018 CLINICAL DATA:  Shortness of breath EXAM: PORTABLE CHEST 1 VIEW COMPARISON:  None. FINDINGS: There is mild cardiomegaly. Pulmonary vascular congestion and increased interstitial markings seen throughout both lungs. No acute osseous abnormality. IMPRESSION: Cardiomegaly and interstitial edema. Electronically Signed   By: Prudencio Pair M.D.   On: 12/18/2018 00:26    ____________________________________________  PROCEDURES   Procedure(s) performed (including Critical Care):  .Critical Care Performed by: Duffy Bruce, MD Authorized by: Duffy Bruce, MD   Critical care  provider statement:    Critical care time (minutes):  35   Critical care time was exclusive of:  Separately billable procedures and treating other patients and teaching time   Critical care was necessary to treat or prevent imminent or life-threatening deterioration of the following conditions:  Cardiac failure, circulatory failure, respiratory failure and metabolic crisis   Critical care was time spent personally by me on the following activities:  Development of treatment plan with patient or surrogate, discussions with consultants, evaluation of patient's response to treatment, examination of patient, obtaining history from patient or surrogate, ordering and performing treatments and interventions, ordering and review of laboratory studies, ordering and review of radiographic studies, pulse oximetry, re-evaluation of patient's condition and review of old charts   I assumed direction of critical care for this patient from another provider in my specialty: no      ____________________________________________  INITIAL IMPRESSION / MDM / [place] / ED COURSE  As part of my medical decision making, I reviewed the following data within the  electronic MEDICAL RECORD NUMBER Notes from prior ED visits and [place] Controlled Substance Database      *Ford Matkowski was evaluated in Emergency Department on 12/18/2018 for the symptoms described in the history of present illness. He was evaluated in the context of the global COVID-19 pandemic, which necessitated consideration that the patient might be at risk for infection with the SARS-CoV-2 virus that causes COVID-19. Institutional protocols and algorithms that pertain to the evaluation of patients at risk for COVID-19 are in a state of rapid change based on information released by regulatory bodies including the CDC and federal and state organizations. These policies and algorithms were followed during the patient's care in the ED.  Some ED evaluations and interventions may be delayed as a result of limited staffing during the pandemic.*      Medical Decision Making: 43 yo M here with acute resp distress likely 2/2 pulmonary edema from not completing dialysis. Initial concern was for STEMI per EMS but reviewed EKG with Dr. Fletcher Anon on arrival, suspect EKG changes are 2/2 hyperkalemia/metabolic disturbances. Pt placed on BIPAP, will give empiric Ca given peaked TW on EKG. CMP shows marked hyperkalemia. Insulin, albuterol given. Dr. Holley Raring consulted, will go for bedside HD. Admit to medicine.  ____________________________________________  FINAL CLINICAL IMPRESSION(S) / ED DIAGNOSES  Final diagnoses:  Acute respiratory failure with hypoxia (HCC)  Acute pulmonary edema (HCC)     MEDICATIONS GIVEN DURING THIS VISIT:  Medications  calcium gluconate 1 g/ 50 mL sodium chloride IVPB (1,000 mg Intravenous New Bag/Given 12/18/18 0035)  LORazepam (ATIVAN) injection 1 mg (1 mg Intravenous Given 12/18/18 0040)  insulin aspart (novoLOG) injection 5 Units (5 Units Intravenous Given 12/18/18 0121)  dextrose 50 % solution 50 mL (50 mLs Intravenous Given 12/18/18 0122)  albuterol (PROVENTIL) (2.5 MG/3ML) 0.083%  nebulizer solution 5 mg (5 mg Nebulization Given 12/18/18 0109)     ED Discharge Orders    None       Note:  This document was prepared using Dragon voice recognition software and may include unintentional dictation errors.   Duffy Bruce, MD 12/18/18 [place] Pop    Duffy Bruce, MD 12/18/18 [phone]

## 2018-12-18 NOTE — Consult Note (Signed)
[facility] Consult Note  MRN : [id number]  Ralph Lucero is a 43 y.o. ([birth year]) male who presents with chief complaint of  Chief Complaint  Patient presents with  . Chest Pain  .  History of Present Illness:   I am asked to see the patient by Dr. Marcille Blanco.  The patient is a 43 year old noncompliant dialysis patient who missed multiple sessions and presented to [facility] and respiratory distress.  Attempts at using his fistula were unsuccessful and therefore a temporary catheter was attempted.  Unfortunately the catheter could not be placed and I have now been asked to place a catheter.  Patient has multiple complaints at this time.  He is noting significant pain in his groin.  He is complaining that he has to have a bowel movement.  He is no longer on BiPAP and therefore is not in any extremis or distress.  Current Facility-Administered Medications  Medication Dose Route Frequency Provider Last Rate Last Dose  . 0.9 %  sodium chloride infusion  100 mL Intravenous PRN Lateef, Munsoor, MD      . 0.9 %  sodium chloride infusion  100 mL Intravenous PRN Lateef, Munsoor, MD      . acetaminophen (TYLENOL) tablet 650 mg  650 mg Oral Q6H PRN Harrie Foreman, MD       Or  . acetaminophen (TYLENOL) suppository 650 mg  650 mg Rectal Q6H PRN Harrie Foreman, MD      . alteplase (CATHFLO ACTIVASE) injection 2 mg  2 mg Intracatheter Once PRN Holley Raring, Munsoor, MD      . Chlorhexidine Gluconate Cloth 2 % PADS 6 each  6 each Topical Q0600 Harrie Foreman, MD      . docusate sodium (COLACE) capsule 100 mg  100 mg Oral BID Harrie Foreman, MD      . fentaNYL (SUBLIMAZE) injection 50 mcg  50 mcg Intravenous Q2H PRN Tyler Pita, MD      . heparin bolus via infusion 3,600 Units  3,600 Units Intravenous Once Hallaji, Sheema M, RPH       Followed by  . heparin ADULT infusion 100 units/mL (25000 units/278mL sodium chloride 0.45%)  700  Units/hr Intravenous Continuous Hallaji, Sheema M, RPH      . heparin injection 1,000-6,000 Units  1,000-6,000 Units Intravenous PRN Awilda Bill, NP      . lidocaine (PF) (XYLOCAINE) 1 % injection 5 mL  5 mL Intradermal PRN Lateef, Munsoor, MD      . lidocaine (XYLOCAINE) 1 % (with pres) injection 5 mL  5 mL Intradermal Once Awilda Bill, NP      . lidocaine-prilocaine (EMLA) cream 1 application  1 application Topical PRN Lateef, Munsoor, MD      . ondansetron (ZOFRAN) tablet 4 mg  4 mg Oral Q6H PRN Harrie Foreman, MD       Or  . ondansetron [facility]) injection 4 mg  4 mg Intravenous Q6H PRN Harrie Foreman, MD      . pentafluoroprop-tetrafluoroeth [facility]) aerosol 1 application  1 application Topical PRN Anthonette Legato, MD        Past Medical History:  Diagnosis Date  . ESRD (end stage renal disease) on dialysis ([place])   . Hypertension   . Renal disorder     History reviewed. No pertinent surgical history.  Social History Social History   Tobacco Use  . Smoking status: Never Smoker  . Smokeless tobacco: Never Used  Substance Use Topics  . Alcohol use: Not on file  . Drug use: Not on file    Family History History reviewed. No pertinent family history. No family history of bleeding/clotting disorders, porphyria or autoimmune disease   Not on File   REVIEW OF SYSTEMS (Negative unless checked)  Constitutional: [] Weight loss  [] Fever  [] Chills Cardiac: [] Chest pain   [] Chest pressure   [] Palpitations   [] Shortness of breath at rest   [x] Shortness of breath with exertion. Vascular:  [] Pain in legs with walking   [] Pain in legs at rest  [] History of DVT   [] Phlebitis   [] Swelling in legs   [] Varicose veins   [] Non-healing ulcers Pulmonary:   [] Uses home oxygen   [] Productive cough   [] Hemoptysis   [] Wheeze  [] COPD   [] Asthma Neurologic:  [] Dizziness  [] Blackouts   [] Seizures   [] History of stroke   [] History of TIA  [] Aphasia   [] Temporary blindness    [] Dysphagia   [] Weakness or numbness in arms   [] Weakness or numbness in legs Musculoskeletal:  [] Arthritis   [] Joint swelling   [] Joint pain   [] Low back pain Hematologic:  [] Easy bruising    [] Hypercoagulable state   [] Anemic  [] Hepatitis Gastrointestinal:  [] Blood in stool   [] Vomiting blood  [] Gastroesophageal reflux/heartburn   [] Difficulty swallowing. Genitourinary:  [x] Chronic kidney disease   [] Difficult urination  [] Frequent urination  [] Burning with urination   [] Blood in urine Skin:  [] Rashes   [] Ulcers   Psychological:  [] History of anxiety   []  History of major depression.    Physical Examination  Vitals:   12/18/18 0400 12/18/18 0500 12/18/18 0600 12/18/18 0700  BP: (!) 146/88 (!) 139/100 (!) 143/104 103/86  Pulse: 64 75 75 75  Resp: 17 (!) 21 17 (!) 32  Temp:  (!) 97.5 F (36.4 C)    TempSrc:  Oral    SpO2: 98% 100% 100% 96%  Weight:      Height:       Body mass index is 17.97 kg/m.  Head: [place]/AT, No temporalis wasting.  Ear/Nose/Throat: Nares w/o erythema or drainage, oropharynx mucus membranes moist Eyes: PERRLA, Sclera nonicteric.  Neck: Supple,   No JVD.  Pulmonary:  Breath sounds equal bilaterally, no use of accessory muscles.  Cardiac: RRR, normal S1, S2,  Vascular: 2+ radial and femoral pulses bilaterally; left arm access weak thrill poor bruit Gastrointestinal: soft, non-tender, non-distended.  Musculoskeletal: Moves all extremities.  No deformity or atrophy. No edema.  Right groin hematoma Neurologic: 5/5 motor sensory, face appears symmetric speech fluent  Psychiatric: Appropriate mood for situation. Dermatologic: No rashes or ulcers noted.  No cellulitis or open wounds. Lymph : No Cervical,  or Inguinal lymphadenopathy.      CBC Lab Results  Component Value Date   WBC 5.5 12/18/2018   HGB 9.8 (L) 12/18/2018   HCT 30.4 (L) 12/18/2018   MCV 96.8 12/18/2018   PLT 142 (L) 12/18/2018    BMET    Component Value Date/Time   NA 143 12/17/2018  2348   K 6.0 (H) 12/18/2018 0339   CL 100 12/17/2018 2348   CO2 24 12/17/2018 2348   GLUCOSE 124 (H) 12/17/2018 2348   BUN 94 (H) 12/17/2018 2348   CREATININE 16.61 (H) 12/17/2018 2348   CALCIUM 8.2 (L) 12/17/2018 2348   GFRNONAA 3 (L) 12/17/2018 2348   GFRAA 4 (L) 12/17/2018 2348   Estimated Creatinine Clearance: 4.9 mL/min (A) (by C-G formula based on SCr of 16.61 mg/dL (  H)).  COAG Lab Results  Component Value Date   INR 1.1 12/18/2018      Assessment/Plan 1.  Complication dialysis device with thrombosis AV access:  Patient's left arm dialysis access is nonfunctional. The patient will undergo angiography using interventional techniques to restore proper flow. Potassium will be drawn to ensure that it is an appropriate level prior to performing thrombectomy.  In the meantime given his overall condition temporary dialysis access will be obtained.  No further treatment for the hematoma at this time. 2.  End-stage renal disease requiring hemodialysis:  Patient will continue dialysis therapy without further interruption if a successful thrombectomy is not achieved then catheter will be placed. Dialysis has already been arranged since the patient missed their previous session 3.  Hypertension:  Patient will continue medical management; nephrology is following no changes in oral medications. 4. Diabetes mellitus:  Glucose will be monitored and oral medications been held this morning once the patient has undergone the patient's procedure po intake will be reinitiated and again Accu-Cheks will be used to assess the blood glucose level and treat as needed. The patient will be restarted on the patient's usual hypoglycemic regime     Hortencia Pilar, MD  12/18/2018 9:02 AM

## 2018-12-18 NOTE — Progress Notes (Signed)
[facility]  ROUNDING NOTE   Subjective:   Ralph Lucero admitted to [facility] ICU on 12/17/2018 for Acute pulmonary edema ([place]) [J81.0] Acute respiratory failure with hypoxia [facility]) [J96.01]  Patient was unable to get hemodialysis Saturday (9/5) due to inability to access patient's AVF. Patient then presented yesterday with pulmonary edema requiring BIPAP and hyperkalemia requiring albuterol, insulin, dextrose and bicarb.   Patient's AVF was attempted to be accessed twice last night. Temp HD catheter was then attempted. However patient currently with out access.   Placed on sedation due to delirium.   Objective:  Vital signs in last 24 hours:  Temp:  [97.5 F (36.4 C)-98.1 F (36.7 C)] 98.1 F (36.7 C) (09/08 0800) Pulse Rate:  [64-82] 66 (09/08 1000) Resp:  [13-33] 13 (09/08 1000) BP: (83-160)/(65-113) 83/65 (09/08 1000) SpO2:  [94 %-100 %] 100 % (09/08 1000) Weight:  [60.1 kg] 60.1 kg (09/07 2359)  Weight change:  Filed Weights   12/17/18 2359  Weight: 60.1 kg    Intake/Output: I/O last 3 completed shifts: In: 11 [IV Piggyback:50] Out: -    Intake/Output this shift:  Total I/O In: 10.4 [I.V.:10.4] Out: -   Physical Exam: General: Laying in bed  Head: Normocephalic, atraumatic. Moist oral mucosal membranes  Eyes: Anicteric, PERRL  Neck: Supple, trachea midline  Lungs:  Bilateral basilar crackles  Heart: Regular rate and rhythm  Abdomen:  Soft, nontender,   Extremities: no peripheral edema.  Neurologic: sedated  Skin: No lesions  Access: Left AVF + thrill, +bruit    Basic Metabolic Panel: Recent Labs  Lab 12/17/18 2348 12/18/18 0339  NA 143  --   K 6.8* 6.0*  CL 100  --   CO2 24  --   GLUCOSE 124*  --   BUN 94*  --   CREATININE 16.61*  --   CALCIUM 8.2*  --   MG 3.0*  --     Liver Function Tests: Recent Labs  Lab 12/17/18 2348  AST 23  ALT 30  ALKPHOS 111  BILITOT 0.8  PROT 8.0  ALBUMIN 4.5   No results for  input(s): LIPASE, AMYLASE in the last 168 hours. No results for input(s): AMMONIA in the last 168 hours.  CBC: Recent Labs  Lab 12/17/18 2348 12/18/18 0648  WBC 9.6 5.5  NEUTROABS 7.7 4.2  HGB 11.1* 9.8*  HCT 34.9* 30.4*  MCV 97.5 96.8  PLT 140* 142*    Cardiac Enzymes: No results for input(s): CKTOTAL, CKMB, CKMBINDEX, TROPONINI in the last 168 hours.  BNP: Invalid input(s): POCBNP  CBG: No results for input(s): GLUCAP in the last 168 hours.  Microbiology: Results for orders placed or performed during the hospital encounter of 12/17/18  SARS Coronavirus 2 [facility] order, Performed in [facility] hospital lab) Nasopharyngeal Nasopharyngeal Swab     Status: None   Collection Time: 12/18/18 12:31 AM   Specimen: Nasopharyngeal Swab  Result Value Ref Range Status   SARS Coronavirus 2 NEGATIVE NEGATIVE Final    Comment: (NOTE) If result is NEGATIVE SARS-CoV-2 target nucleic acids are NOT DETECTED. The SARS-CoV-2 RNA is generally detectable in upper and lower  respiratory specimens during the acute phase of infection. The lowest  concentration of SARS-CoV-2 viral copies this assay can detect is 250  copies / mL. A negative result does not preclude SARS-CoV-2 infection  and should not be used as the sole basis for treatment or other  patient management decisions.  A negative result may occur with  improper specimen collection / handling, submission of specimen other  than nasopharyngeal swab, presence of viral mutation(s) within the  areas targeted by this assay, and inadequate number of viral copies  (<250 copies / mL). A negative result must be combined with clinical  observations, patient history, and epidemiological information. If result is POSITIVE SARS-CoV-2 target nucleic acids are DETECTED. The SARS-CoV-2 RNA is generally detectable in upper and lower  respiratory specimens dur ing the acute phase of infection.  Positive  results are indicative of active  infection with SARS-CoV-2.  Clinical  correlation with patient history and other diagnostic information is  necessary to determine patient infection status.  Positive results do  not rule out bacterial infection or co-infection with other viruses. If result is PRESUMPTIVE POSTIVE SARS-CoV-2 nucleic acids MAY BE PRESENT.   A presumptive positive result was obtained on the submitted specimen  and confirmed on repeat testing.  While 2019 novel coronavirus  (SARS-CoV-2) nucleic acids may be present in the submitted sample  additional confirmatory testing may be necessary for epidemiological  and / or clinical management purposes  to differentiate between  SARS-CoV-2 and other Sarbecovirus currently known to infect humans.  If clinically indicated additional testing with an alternate test  methodology [id number]) is advised. The SARS-CoV-2 RNA is generally  detectable in upper and lower respiratory sp ecimens during the acute  phase of infection. The expected result is Negative. Fact Sheet for Patients:  StrictlyIdeas.no Fact Sheet for Healthcare Providers: BankingDealers.co.za This test is not yet approved or cleared by the [place] FDA and has been authorized for detection and/or diagnosis of SARS-CoV-2 by FDA under an Emergency Use Authorization (EUA).  This EUA will remain in effect (meaning this test can be used) for the duration of the COVID-19 declaration under Section 564(b)(1) of the Act, 21 U.S.C. section 360bbb-3(b)(1), unless the authorization is terminated or revoked sooner. Performed at [facility] [place], [place] [address]     Coagulation Studies: Recent Labs    12/18/18 0648  LABPROT 13.8  INR 1.1    Urinalysis: No results for input(s): COLORURINE, LABSPEC, PHURINE, GLUCOSEU, HGBUR, BILIRUBINUR, KETONESUR, PROTEINUR, UROBILINOGEN, NITRITE, LEUKOCYTESUR in the last 72 hours.  Invalid  input(s): APPERANCEUR    Imaging: Dg Chest Portable 1 View  Result Date: 12/18/2018 CLINICAL DATA:  Shortness of breath EXAM: PORTABLE CHEST 1 VIEW COMPARISON:  None. FINDINGS: There is mild cardiomegaly. Pulmonary vascular congestion and increased interstitial markings seen throughout both lungs. No acute osseous abnormality. IMPRESSION: Cardiomegaly and interstitial edema. Electronically Signed   By: Prudencio Pair M.D.   On: 12/18/2018 00:26     Medications:   . sodium chloride    . sodium chloride    . heparin     . Chlorhexidine Gluconate Cloth  6 each Topical Q0600  . docusate sodium  100 mg Oral BID  . heparin  3,600 Units Intravenous Once  . lidocaine  5 mL Intradermal Once   sodium chloride, sodium chloride, acetaminophen **OR** acetaminophen, alteplase, fentaNYL (SUBLIMAZE) injection, heparin, lidocaine (PF), lidocaine-prilocaine, ondansetron **OR** ondansetron (ZOFRAN) IV, pentafluoroprop-tetrafluoroeth  Assessment/ Plan:  Mr. Debora Shirah is a 43 y.o. Hispanic Spanish Speaking only male with end stage renal disease on hemodialysis followed by [facility] Nephrology, hypertension, coronary artery disease, secondary hyperparathyroidism status post parathyroidectomy who presents to [facility] with hyperkalemia and pulmonary edema.   [facility] Nephrology TTS [place] Left AVF  1. End Stage Renal Disease with hyperkalemia: complication with dialysis device.  -  Appreciate vascular input.  - Once access has been established, will proceed with hemodialysis treatment today.   2. Hypertension: history of difficult to control. Hypertensive urgency on admission. Now with hypotension status post sedation.  Home regimen of metoprolol, lisinopril, isosorbide mononitrate and felodipine.   3. Anemia of chronic kidney disease: hemoglobin 9.8  - EPO with HD treatment.   4. Secondary Hyperparathyroidism status post parathyroidectomy  - calcium acetate with meals.     LOS: 0 Dorette Hartel 9/8/202011:23 AM

## 2018-12-18 NOTE — Progress Notes (Signed)
Temporary dialysis catheter placement attempted but unsuccessful by ICU team, would suggest vascular surgery consultation for placement so that we are able to dialyze pt.  Cannulation of AVF was attempted however significant resistance noted.

## 2018-12-18 NOTE — Progress Notes (Signed)
Assessment of the pt's dialysis access revealed a whistling on auscultation indicating stenosis, the pt also reports that blood oozes from the needle site at the venous site (also indicating stenosis) With the patient's permission, I attempted to cannulate his AVF, both needles were inserted successfully however there was too much resistance and pressure at the venous site to perform the HD treatment via the AVF, will attempt again after temp cath placement.

## 2018-12-18 NOTE — Op Note (Signed)
  OPERATIVE NOTE   PROCEDURE: 1. Insertion of temporary dialysis catheter catheter left thigh approach.  PRE-OPERATIVE DIAGNOSIS: Complication dialysis device with inability to cannulate.  Pulmonary edema.  End-stage renal disease requiring hemodialysis  POST-OPERATIVE DIAGNOSIS: Same  SURGEON: Katha Cabal M.D.  ANESTHESIA: 1% lidocaine local infiltration  ESTIMATED BLOOD LOSS: Minimal cc  INDICATIONS:   Ralph Lucero is a 42 y.o. male who presents with pulmonary edema.  He has missed several dialysis runs.  Attempts at accessing his fistula once admitted were unsuccessful and I am asked to place a temporary dialysis catheter  DESCRIPTION: After obtaining full informed written consent, the patient was positioned supine. The patient's left groin was prepped and draped in a sterile fashion. Ultrasound was placed in a sterile sleeve. Ultrasound was utilized to identify the left common femoral vein which is noted to be echolucent and compressible indicating patency. Images recorded for the permanent record. Under real-time visualization a micropuncture needle is inserted into the vein followed by the microwire.  Micro-sheath is then inserted without difficulty.  An Amplatz 75 cm wire is then advanced without difficulty. Small counterincision was made at the wire insertion site. Dilator is passed over the wire and the temporary dialysis catheter catheter is fed over the wire without difficulty.  All lumens aspirate and flush easily and are packed with heparin saline. Catheter secured to the skin of the left thigh with 2-0 silk. A sterile dressing is applied with Biopatch.  COMPLICATIONS: None  CONDITION: Unchanged  Ralph Lucero Office:  [phone] 12/18/2018, 6:28 PM

## 2018-12-18 NOTE — Progress Notes (Signed)
Post Hd Assessment    12/18/18 1951  Neurological  Level of Consciousness Alert  Orientation Level Oriented X4  Respiratory  Respiratory Pattern Regular;Unlabored  Chest Assessment Chest expansion symmetrical  Bilateral Breath Sounds Clear;Diminished  Cough None  Cardiac  Pulse Regular  Heart Sounds S1, S2  ECG Monitor Yes  Cardiac Rhythm NSR  Vascular  R Radial Pulse +2  L Radial Pulse +2  Psychosocial  Psychosocial (WDL) WDL  Patient Behaviors Calm;Cooperative  Needs Expressed Physical  Emotional support given Given to patient

## 2018-12-18 NOTE — Consult Note (Signed)
Name: Ralph Lucero MRN: [id number] DOB: November 19, 1975    ADMISSION DATE:  12/17/2018 CONSULTATION DATE: 12/18/2018  REFERRING MD : Dr. Marcille Lucero  CHIEF COMPLAINT: Shortness of Breath   BRIEF PATIENT DESCRIPTION:  43 yo male with ESRD on HD admitted with pulmonary edema and hyperkalemia requiring Bipap after not having HD session on 09/5 due to inability to cannulate dialysis access site   SIGNIFICANT EVENTS/STUDIES:  09/8-Pt admitted to the stepdown unit on Bipap   HISTORY OF PRESENT ILLNESS:   This is a 43 yo male with a PMH of ESRD on HD (T-Th-Sat) and HTN.  He presented to [facility] ER on 09/7 with worsening shortness of breath, mild cough, nausea, and vomiting.  He also endorsed chest pressure with radiation to the left arm. Per ER notes pt reported he has had similar symptoms in the past when he missed dialysis sessions.  His most recent completed HD session was on 09/3.  He was scheduled for HD on 09/5, but due dialysis nurse inability to cannulate access site pt unable to undergo HD.  Upon arrival to the ER pt in respiratory distress with increased work of breathing requiring Bipap.  Lab results revealed K+ 6.8, BUN 94, creatinine 16.61, anion gap 19, calcium 8.2, BNP 1,555, troponin 36, and hgb 11.1.  EKG showed sinus rhythm with diffuse peaked T waves.  COVID-19 negative, however CXR concerning for cardiomegaly and interstitial edema.  Pt received albuterol neb, 1 g calcium gluconate, 1 amp of D50W, 5 units of insulin, and 1 mg iv ativan.  Nephrology consulted recommended emergent hemodialysis session.  Pt subsequently admitted to the stepdown unit by hospitalist team for additional workup and treatment.    PAST MEDICAL HISTORY :   has a past medical history of ESRD (end stage renal disease) on dialysis (Ralph Lucero), Hypertension, and Renal disorder.  has no past surgical history on file. Prior to Admission medications   Medication Sig Start Date End Date Taking? Authorizing Provider  B  Complex-C-Zn-Folic Acid (DIALYVITE/ZINC) TABS Take 1 tablet by mouth every evening. 10/18/18   [provider]   Not on File  FAMILY HISTORY:  family history is not on file. SOCIAL HISTORY:  reports that he has never smoked. He has never used smokeless tobacco.  REVIEW OF SYSTEMS: Positives in BOLD   Constitutional: Negative for fever, chills, weight loss, malaise/fatigue and diaphoresis.  HENT: Negative for hearing loss, ear pain, nosebleeds, congestion, sore throat, neck pain, tinnitus and ear discharge.   Eyes: Negative for blurred vision, double vision, photophobia, pain, discharge and redness.  Respiratory: cough, hemoptysis, sputum production, shortness of breath, wheezing and stridor.   Cardiovascular: chest pressure, palpitations, orthopnea, claudication, leg swelling and PND.  Gastrointestinal: heartburn, nausea, vomiting, abdominal pain, diarrhea, constipation, blood in stool and melena.  Genitourinary: Negative for dysuria, urgency, frequency, hematuria and flank pain.  Musculoskeletal: Negative for myalgias, back pain, joint pain and falls.  Skin: Negative for itching and rash.  Neurological: Negative for dizziness, tingling, tremors, sensory change, speech change, focal weakness, seizures, loss of consciousness, weakness and headaches.  Endo/Heme/Allergies: Negative for environmental allergies and polydipsia. Does not bruise/bleed easily.  SUBJECTIVE:  Pt states "I am scared"  VITAL SIGNS: Temp:  [97.9 F (36.6 C)] 97.9 F (36.6 C) (09/07 2357) Pulse Rate:  [78-82] 78 (09/08 0100) Resp:  [25-30] 30 (09/08 0100) BP: (133-151)/(92-105) 151/105 (09/08 0100) SpO2:  [94 %-99 %] 99 % (09/08 0100) Weight:  [60.1 kg] 60.1 kg (09/07 2359)  PHYSICAL EXAMINATION: General:  well developed, well nourished male, NAD  Neuro: alert and oriented, follows commands  HEENT: supple, JVD present  Cardiovascular: sinus rhythm, peaked T waves, no R/G Lungs: diffuse crackles  throughout, even, non labored on Bipap  Abdomen: +BS x4, soft, non tender, non distended  Musculoskeletal: normal bulk and tone, no edema  Skin: intact no rashes or lesions present   Recent Labs  Lab 12/17/18 2348  NA 143  K 6.8*  CL 100  CO2 24  BUN 94*  CREATININE 16.61*  GLUCOSE 124*   Recent Labs  Lab 12/17/18 2348  HGB 11.1*  HCT 34.9*  WBC 9.6  PLT 140*   Dg Chest Portable 1 View  Result Date: 12/18/2018 CLINICAL DATA:  Shortness of breath EXAM: PORTABLE CHEST 1 VIEW COMPARISON:  None. FINDINGS: There is mild cardiomegaly. Pulmonary vascular congestion and increased interstitial markings seen throughout both lungs. No acute osseous abnormality. IMPRESSION: Cardiomegaly and interstitial edema. Electronically Signed   By: Ralph Lucero M.D.   On: 12/18/2018 00:26    ASSESSMENT / PLAN:  Acute respiratory failure secondary to pulmonary edema in setting of ESRD (missed HD session due to dialysis access issues) Hyperkalemia  Supplemental O2 for dyspnea and/or hypoxia  Prn bronchodilator therapy  Nephrology consulted appreciate input-scheduled for emergent HD (if unable to access fistula, will place temporary HD catheter) Trend BMP  Replace electrolytes as indicated  Avoid nephrotoxic medications   Elevated troponin likely demand ischemia in setting of acute respiratory failure  Hypertension  Continuous telemetry monitoring  Trend troponin  Prn hydralazine for bp management   -Utilizing Spanish Interpretor via Ipad explained current plan of care. I explained to Ralph Lucero the dialysis nurse attempted to access left forearm fistula, however unsuccessful.  I informed him he would require temporary dialysis catheter placement all questions were answered.  He agreed with plan of care, consent obtained for hemodialysis and temporary dialysis catheter placement.    Marda Stalker, [place] Pager [phone] (please enter 7 digits) PCCM Consult Pager  [phone] (please enter 7 digits)

## 2018-12-18 NOTE — ED Notes (Signed)
ED TO INPATIENT HANDOFF REPORT  ED Nurse Name and Phone #: Quillian Quince [phone]  S Name/Age/Gender Ralph Lucero 43 y.o. male Room/Bed: ED07A/ED07A  Code Status   Code Status: Not on file  Home/SNF/Other Home Patient oriented to: self, place, time and situation Is this baseline? Yes   Triage Complete: Triage complete  Chief Complaint Code Stemi  Triage Note Patient states that he went to his dialysis treatment on Saturday, but they were unable to use his veins. His last dialysis treatment was in Thursday. Patient is now having chest pain and nausea.   Allergies Not on File  Level of Care/Admitting Diagnosis ED Disposition    ED Disposition Condition [facility] Area: [place] [[id number]]  Level of Care: Stepdown [14]  Covid Evaluation: Confirmed COVID Negative  Diagnosis: Acute on chronic respiratory failure with hypoxemia [id number]  Admitting Physician: Harrie Foreman [id number]  Attending Physician: Harrie Foreman [id number]  Estimated length of stay: past midnight tomorrow  Certification:: I certify this patient will need inpatient services for at least 2 midnights  PT Class (Do Not Modify): Inpatient [101]  PT Acc Code (Do Not Modify): Private [1]       B Medical/Surgery History Past Medical History:  Diagnosis Date  . ESRD (end stage renal disease) on dialysis ([place])   . Hypertension   . Renal disorder    History reviewed. No pertinent surgical history.   A IV Location/Drains/Wounds Patient Lines/Drains/Airways Status   Active Line/Drains/Airways    Name:   Placement date:   Placement time:   Site:   Days:   Peripheral IV 12/17/18 Right Forearm   12/17/18    2333    Forearm   1   Peripheral IV 12/17/18 Right Antecubital   12/17/18    2350    Antecubital   1          Intake/Output Last 24 hours  Intake/Output Summary (Last 24 hours) at 12/18/2018 0307 Last data filed at 12/18/2018 0307 Gross  per 24 hour  Intake 50 ml  Output -  Net 50 ml    Labs/Imaging Results for orders placed or performed during the hospital encounter of 12/17/18 (from the past 48 hour(s))  CBC with Differential     Status: Abnormal   Collection Time: 12/17/18 11:48 PM  Result Value Ref Range   WBC 9.6 4.0 - 10.5 K/uL   RBC 3.58 (L) 4.22 - 5.81 MIL/uL   Hemoglobin 11.1 (L) 13.0 - 17.0 g/dL   HCT 34.9 (L) 39.0 - 52.0 %   MCV 97.5 80.0 - 100.0 fL   MCH 31.0 26.0 - 34.0 pg   MCHC 31.8 30.0 - 36.0 g/dL   RDW 14.6 11.5 - 15.5 %   Platelets 140 (L) 150 - 400 K/uL   nRBC 0.0 0.0 - 0.2 %   Neutrophils Relative % 80 %   Neutro Abs 7.7 1.7 - 7.7 K/uL   Lymphocytes Relative 11 %   Lymphs Abs 1.1 0.7 - 4.0 K/uL   Monocytes Relative 6 %   Monocytes Absolute 0.5 0.1 - 1.0 K/uL   Eosinophils Relative 2 %   Eosinophils Absolute 0.2 0.0 - 0.5 K/uL   Basophils Relative 1 %   Basophils Absolute 0.1 0.0 - 0.1 K/uL   Immature Granulocytes 0 %   Abs Immature Granulocytes 0.04 0.00 - 0.07 K/uL    Comment: Performed at [facility], [address] [place], [place]  [address]  Comprehensive metabolic panel     Status: Abnormal   Collection Time: 12/17/18 11:48 PM  Result Value Ref Range   Sodium 143 135 - 145 mmol/L   Potassium 6.8 (HH) 3.5 - 5.1 mmol/L    Comment: CRITICAL RESULT CALLED TO, READ BACK BY AND VERIFIED WITH Elijah Michaelis 12/18/2018 1251 SJL    Chloride 100 98 - 111 mmol/L   CO2 24 22 - 32 mmol/L   Glucose, Bld 124 (H) 70 - 99 mg/dL   BUN 94 (H) 6 - 20 mg/dL   Creatinine, Ser 16.61 (H) 0.61 - 1.24 mg/dL   Calcium 8.2 (L) 8.9 - 10.3 mg/dL   Total Protein 8.0 6.5 - 8.1 g/dL   Albumin 4.5 3.5 - 5.0 g/dL   AST 23 15 - 41 U/L   ALT 30 0 - 44 U/L   Alkaline Phosphatase 111 38 - 126 U/L   Total Bilirubin 0.8 0.3 - 1.2 mg/dL   GFR calc non Af Amer 3 (L) >60 mL/min   GFR calc Af Amer 4 (L) >60 mL/min   Anion gap 19 (H) 5 - 15    Comment: Performed at [facility], [address] [place], [place] [address]  Magnesium     Status: Abnormal   Collection Time: 12/17/18 11:48 PM  Result Value Ref Range   Magnesium 3.0 (H) 1.7 - 2.4 mg/dL    Comment: Performed at [facility], [place], [place] [address]  Troponin I (High Sensitivity)     Status: Abnormal   Collection Time: 12/17/18 11:48 PM  Result Value Ref Range   Troponin I (High Sensitivity) 36 (H) <18 ng/L    Comment: (NOTE) Elevated high sensitivity troponin I (hsTnI) values and significant  changes across serial measurements may suggest ACS but many other  chronic and acute conditions are known to elevate hsTnI results.  Refer to the "Links" section for chest pain algorithms and additional  guidance. Performed at [facility], [address] [place], [place] [address]   Brain natriuretic peptide     Status: Abnormal   Collection Time: 12/17/18 11:48 PM  Result Value Ref Range   B Natriuretic Peptide 1,555.0 (H) 0.0 - 100.0 pg/mL    Comment: Performed at [facility], [address] [place], [place] [address]  SARS Coronavirus 2 [facility] order, Performed in [facility] hospital lab) Nasopharyngeal Nasopharyngeal Swab     Status: None   Collection Time: 12/18/18 12:31 AM   Specimen: Nasopharyngeal Swab  Result Value Ref Range   SARS Coronavirus 2 NEGATIVE NEGATIVE    Comment: (NOTE) If result is NEGATIVE SARS-CoV-2 target nucleic acids are NOT DETECTED. The SARS-CoV-2 RNA is generally detectable in upper and lower  respiratory specimens during the acute phase of infection. The lowest  concentration of SARS-CoV-2 viral copies this assay can detect is 250  copies / mL. A negative result does not preclude SARS-CoV-2 infection  and should not be used as the sole basis for treatment or other  patient management decisions.  A negative result may occur with  improper specimen collection / handling, submission of specimen other  than nasopharyngeal swab,  presence of viral mutation(s) within the  areas targeted by this assay, and inadequate number of viral copies  (<250 copies / mL). A negative result must be combined with clinical  observations, patient history, and epidemiological information. If result is POSITIVE SARS-CoV-2 target nucleic acids are DETECTED. The SARS-CoV-2 RNA is  generally detectable in upper and lower  respiratory specimens dur ing the acute phase of infection.  Positive  results are indicative of active infection with SARS-CoV-2.  Clinical  correlation with patient history and other diagnostic information is  necessary to determine patient infection status.  Positive results do  not rule out bacterial infection or co-infection with other viruses. If result is PRESUMPTIVE POSTIVE SARS-CoV-2 nucleic acids MAY BE PRESENT.   A presumptive positive result was obtained on the submitted specimen  and confirmed on repeat testing.  While 2019 novel coronavirus  (SARS-CoV-2) nucleic acids may be present in the submitted sample  additional confirmatory testing may be necessary for epidemiological  and / or clinical management purposes  to differentiate between  SARS-CoV-2 and other Sarbecovirus currently known to infect humans.  If clinically indicated additional testing with an alternate test  methodology [phone]) is advised. The SARS-CoV-2 RNA is generally  detectable in upper and lower respiratory sp ecimens during the acute  phase of infection. The expected result is Negative. Fact Sheet for Patients:  StrictlyIdeas.no Fact Sheet for Healthcare Providers: BankingDealers.co.za This test is not yet approved or cleared by the [place] FDA and has been authorized for detection and/or diagnosis of SARS-CoV-2 by FDA under an Emergency Use Authorization (EUA).  This EUA will remain in effect (meaning this test can be used) for the duration of the COVID-19 declaration under  Section 564(b)(1) of the Act, 21 U.S.C. section 360bbb-3(b)(1), unless the authorization is terminated or revoked sooner. Performed at [facility], [address] [place], [place] [address]    Dg Chest Portable 1 View  Result Date: 12/18/2018 CLINICAL DATA:  Shortness of breath EXAM: PORTABLE CHEST 1 VIEW COMPARISON:  None. FINDINGS: There is mild cardiomegaly. Pulmonary vascular congestion and increased interstitial markings seen throughout both lungs. No acute osseous abnormality. IMPRESSION: Cardiomegaly and interstitial edema. Electronically Signed   By: Prudencio Pair M.D.   On: 12/18/2018 00:26    Pending Labs Unresulted Labs (From admission, onward)    Start     Ordered   Signed and Held  TSH  Add-on,   R     Signed and Held   Signed and Held  Hemoglobin A1c  Add-on,   R     Signed and Held          Vitals/Pain Today's Vitals   12/17/18 2359 12/18/18 0000 12/18/18 0030 12/18/18 0100  BP:  (!) 133/92 (!) 136/94 (!) 151/105  Pulse:  82 78 78  Resp:  (!) 26 (!) 25 (!) 30  Temp:      TempSrc:      SpO2:  96% 94% 99%  Weight: 60.1 kg     Height: 6' (1.829 m)     PainSc: 1        Isolation Precautions Airborne and Contact precautions  Medications Medications  calcium gluconate 1 g/ 50 mL sodium chloride IVPB (0 g Intravenous Stopped 12/18/18 0307)  LORazepam (ATIVAN) injection 1 mg (1 mg Intravenous Given 12/18/18 0040)  insulin aspart (novoLOG) injection 5 Units (5 Units Intravenous Given 12/18/18 0121)  dextrose 50 % solution 50 mL (50 mLs Intravenous Given 12/18/18 0122)  albuterol (PROVENTIL) (2.5 MG/3ML) 0.083% nebulizer solution 5 mg (5 mg Nebulization Given 12/18/18 0109)    Mobility walks Low fall risk   Focused Assessments Pulmonary Assessment Handoff:  Lung sounds: Bilateral Breath Sounds: Coarse crackles L Breath Sounds: Fine crackles R Breath Sounds: Fine crackles O2 Device: Nasal Cannula O2  Flow Rate (L/min): 4  L/min      R Recommendations: See Admitting Provider Note  Report given to:   Additional Notes:

## 2018-12-18 NOTE — Consult Note (Signed)
[facility] for Heparin Infusion Dosing and Monitoring  Indication: chest pain/ACS  No Known Allergies  Patient Measurements: Height: 6' (182.9 cm) Weight: 132 lb 7.9 oz (60.1 kg) IBW/kg (Calculated) : 77.6  Vital Signs: Temp: 98.1 F (36.7 C) (09/08 0800) Temp Source: Oral (09/08 0500) BP: 94/70 (09/08 1200) Pulse Rate: 66 (09/08 1000)  Labs: Recent Labs    12/17/18 2348 12/18/18 0339 12/18/18 0648  HGB 11.1*  --  9.8*  HCT 34.9*  --  30.4*  PLT 140*  --  142*  APTT  --   --  33  LABPROT  --   --  13.8  INR  --   --  1.1  CREATININE 16.61*  --   --   TROPONINIHS 36* 270*  --     Estimated Creatinine Clearance: 4.9 mL/min (A) (by C-G formula based on SCr of 16.61 mg/dL (H)).   Medical History: Past Medical History:  Diagnosis Date  . ESRD (end stage renal disease) on dialysis ([place])   . Hypertension   . Renal disorder     Assessment: Pharmacy consulted for heparin infusion dosing and monitoring for ACS/NSTEMI in 43 yo male. Patient missed dialysis on 9/5 due to inability to access AV fistula. Heparin drip not initiated this am due to vascular placing temporary dialysis access. Per vascular, can initiate heparin infusion with no bolus. Per cards, patient to be medically managed.   Goal of Therapy:  Heparin level 0.3-0.7 units/ml Monitor platelets by anticoagulation protocol: Yes   Plan:  Will initiate heparin 700 units/hr. Will obtain anti-Xa level at 2000. Per vascular surgery; no bolus in initial 24 hours.   Pharmacy will continue to monitor and adjust per consult.   Jaylissa Felty L, RPh 12/18/2018 2:43 PM

## 2018-12-18 NOTE — Progress Notes (Signed)
HD Tx started    12/18/18 1615  Vital Signs  Pulse Rate 69  Pulse Rate Source Monitor  Resp 15  BP 111/85  BP Location Right Arm  BP Method Automatic  Patient Position (if appropriate) Lying  Oxygen Therapy  SpO2 99 %  O2 Device Room Air  Pulse Oximetry Type Continuous  Pain Assessment  Pain Scale 0-10  Pain Score 0  During Hemodialysis Assessment  Blood Flow Rate (mL/min) 300 mL/min  Arterial Pressure (mmHg) -140 mmHg  Venous Pressure (mmHg) 180 mmHg  Transmembrane Pressure (mmHg) 60 mmHg  Ultrafiltration Rate (mL/min) 1170 mL/min  Dialysate Flow Rate (mL/min) 600 ml/min  Conductivity: Machine  13.9  HD Safety Checks Performed Yes  Dialysis Fluid Bolus Normal Saline  Bolus Amount (mL) 250 mL  Intra-Hemodialysis Comments Tx initiated

## 2018-12-18 NOTE — Progress Notes (Signed)
Patient admitted with pulmonary edema and acute respiratory failure after not having HD performed on Saturday due to inability to cannulate access.  Will proceed with HD now.  If for some reason HD access can't be cannulated now pt will need temp HD catheter to be placed.  uF target 2.5kg and will run on 1K bath time 1 hour and then switch to 2k bath for remainder.

## 2018-12-18 NOTE — Progress Notes (Signed)
Pre HD Assessment    12/18/18 1613  Neurological  Level of Consciousness Alert  Orientation Level Oriented X4  Respiratory  Respiratory Pattern Regular;Unlabored  Chest Assessment Chest expansion symmetrical  Bilateral Breath Sounds Diminished;Fine crackles  Cough None  Cardiac  Pulse Regular  Heart Sounds S1, S2  ECG Monitor Yes  Cardiac Rhythm NSR  Vascular  R Radial Pulse +2  L Radial Pulse +2  Psychosocial  Psychosocial (WDL) WDL  Patient Behaviors Calm;Cooperative  Needs Expressed Physical  Emotional support given Given to patient

## 2018-12-19 ENCOUNTER — Inpatient Hospital Stay: Payer: Medicaid Other

## 2018-12-19 ENCOUNTER — Encounter: Payer: Self-pay | Admitting: Vascular Surgery

## 2018-12-19 LAB — RENAL FUNCTION PANEL
Albumin: 3.4 g/dL — ABNORMAL LOW (ref 3.5–5.0)
Anion gap: 15 (ref 5–15)
BUN: 59 mg/dL — ABNORMAL HIGH (ref 6–20)
CO2: 26 mmol/L (ref 22–32)
Calcium: 7.4 mg/dL — ABNORMAL LOW (ref 8.9–10.3)
Chloride: 97 mmol/L — ABNORMAL LOW (ref 98–111)
Creatinine, Ser: 13.03 mg/dL — ABNORMAL HIGH (ref 0.61–1.24)
GFR calc Af Amer: 5 mL/min — ABNORMAL LOW (ref >60)
GFR calc non Af Amer: 4 mL/min — ABNORMAL LOW (ref >60)
Glucose, Bld: 112 mg/dL — ABNORMAL HIGH (ref 70–99)
Phosphorus: 4.3 mg/dL (ref 2.5–4.6)
Potassium: 6 mmol/L — ABNORMAL HIGH (ref 3.5–5.1)
Sodium: 138 mmol/L (ref 135–145)

## 2018-12-19 LAB — GLUCOSE, CAPILLARY
Glucose-Capillary: 52 mg/dL — ABNORMAL LOW (ref 70–99)
Glucose-Capillary: 119 mg/dL — ABNORMAL HIGH (ref 70–99)
Glucose-Capillary: 104 mg/dL — ABNORMAL HIGH (ref 70–99)
Glucose-Capillary: 67 mg/dL — ABNORMAL LOW (ref 70–99)
Glucose-Capillary: 81 mg/dL (ref 70–99)
Glucose-Capillary: 59 mg/dL — ABNORMAL LOW (ref 70–99)
Glucose-Capillary: 170 mg/dL — ABNORMAL HIGH (ref 70–99)
Glucose-Capillary: 91 mg/dL (ref 70–99)
Glucose-Capillary: 60 mg/dL — ABNORMAL LOW (ref 70–99)
Glucose-Capillary: 95 mg/dL (ref 70–99)

## 2018-12-19 LAB — HEMOGLOBIN A1C
Hgb A1c MFr Bld: 5.2 % (ref 4.8–5.6)
Mean Plasma Glucose: 103 mg/dL

## 2018-12-19 LAB — PREPARE RBC (CROSSMATCH)

## 2018-12-19 LAB — HEMOGLOBIN AND HEMATOCRIT, BLOOD
HCT: 23.9 % — ABNORMAL LOW (ref 39.0–52.0)
HCT: 20.4 % — ABNORMAL LOW (ref 39.0–52.0)
HCT: 17.2 % — ABNORMAL LOW (ref 39.0–52.0)
Hemoglobin: 7.6 g/dL — ABNORMAL LOW (ref 13.0–17.0)
Hemoglobin: 6.5 g/dL — ABNORMAL LOW (ref 13.0–17.0)
Hemoglobin: 5.5 g/dL — ABNORMAL LOW (ref 13.0–17.0)

## 2018-12-19 LAB — HEPATITIS B SURFACE ANTIGEN: Hepatitis B Surface Ag: NEGATIVE

## 2018-12-19 LAB — HEPARIN LEVEL (UNFRACTIONATED): Heparin Unfractionated: 0.35 IU/mL (ref 0.30–0.70)

## 2018-12-19 LAB — ABO/RH: ABO/RH(D): A POS

## 2018-12-19 LAB — POTASSIUM: Potassium: 3.4 mmol/L — ABNORMAL LOW (ref 3.5–5.1)

## 2018-12-19 MED ORDER — INFLUENZA VAC SPLIT QUAD 0.5 ML IM SUSY
0.5000 mL | PREFILLED_SYRINGE | INTRAMUSCULAR | Status: DC
  Filled 2018-12-19: qty 0.5

## 2018-12-19 MED ORDER — MORPHINE SULFATE (PF) 2 MG/ML IV SOLN
2.0000 mg | Freq: Once | INTRAVENOUS | Status: AC
  Administered 2018-12-19: 2 mg via INTRAVENOUS
  Filled 2018-12-19: qty 1

## 2018-12-19 MED ORDER — CALCIUM CARBONATE ANTACID 500 MG PO CHEW
1.0000 | CHEWABLE_TABLET | Freq: Two times a day (BID) | ORAL | Status: DC | PRN
  Administered 2018-12-19: 21:00:00 200 mg via ORAL
  Filled 2018-12-19: qty 1

## 2018-12-19 MED ORDER — DEXTROSE 50 % IV SOLN
INTRAVENOUS | Status: AC
  Administered 2018-12-19: 25 mL via INTRAVENOUS
  Filled 2018-12-19: qty 50

## 2018-12-19 MED ORDER — HYDROCODONE-ACETAMINOPHEN 5-325 MG PO TABS
1.0000 | ORAL_TABLET | ORAL | Status: DC | PRN
  Administered 2018-12-19: 1 via ORAL
  Administered 2018-12-20 – 2018-12-21 (×3): 2 via ORAL
  Filled 2018-12-19: qty 2
  Filled 2018-12-19: qty 1
  Filled 2018-12-19 (×2): qty 2

## 2018-12-19 MED ORDER — DEXTROSE 50 % IV SOLN
50.0000 mL | Freq: Once | INTRAVENOUS | Status: AC
  Administered 2018-12-19: 05:00:00 50 mL via INTRAVENOUS

## 2018-12-19 MED ORDER — DEXTROSE 50 % IV SOLN
INTRAVENOUS | Status: AC
  Administered 2018-12-19: 50 mL via INTRAVENOUS
  Filled 2018-12-19: qty 50

## 2018-12-19 MED ORDER — FENTANYL CITRATE (PF) 100 MCG/2ML IJ SOLN
25.0000 ug | Freq: Once | INTRAMUSCULAR | Status: AC
  Administered 2018-12-19: 25 ug via INTRAVENOUS
  Filled 2018-12-19: qty 2

## 2018-12-19 MED ORDER — DEXTROSE 10 % IV SOLN
INTRAVENOUS | Status: DC
  Administered 2018-12-19 – 2018-12-21 (×2): via INTRAVENOUS

## 2018-12-19 MED ORDER — SODIUM CHLORIDE 0.9% IV SOLUTION
Freq: Once | INTRAVENOUS | Status: AC
  Administered 2018-12-19: 21:00:00 via INTRAVENOUS

## 2018-12-19 MED ORDER — DEXTROSE 50 % IV SOLN
25.0000 mL | Freq: Once | INTRAVENOUS | Status: AC
  Administered 2018-12-19: 21:00:00 25 mL via INTRAVENOUS

## 2018-12-19 MED ORDER — SODIUM CHLORIDE 0.9 % IV SOLN
0.3000 ug/kg | Freq: Once | INTRAVENOUS | Status: AC
  Administered 2018-12-19: 19:00:00 18 ug via INTRAVENOUS
  Filled 2018-12-19: qty 4.5

## 2018-12-19 MED ORDER — PANTOPRAZOLE SODIUM 40 MG IV SOLR
40.0000 mg | INTRAVENOUS | Status: DC
  Administered 2018-12-19: 40 mg via INTRAVENOUS
  Filled 2018-12-19: qty 40

## 2018-12-19 MED ORDER — DEXTROSE 50 % IV SOLN
25.0000 g | INTRAVENOUS | Status: AC
  Administered 2018-12-19: 25 g via INTRAVENOUS
  Filled 2018-12-19: qty 50

## 2018-12-19 NOTE — Progress Notes (Signed)
HD Tx started    12/19/18 1327  Vital Signs  Pulse Rate 70  Pulse Rate Source Monitor  Resp 16  BP (!) 107/58  Oxygen Therapy  SpO2 100 %  During Hemodialysis Assessment  Blood Flow Rate (mL/min) 300 mL/min (Max achievable BFR MD aware )  Arterial Pressure (mmHg) -180 mmHg  Venous Pressure (mmHg) 160 mmHg  Transmembrane Pressure (mmHg) 60 mmHg  Ultrafiltration Rate (mL/min) 0 mL/min  Dialysate Flow Rate (mL/min) 600 ml/min  Conductivity: Machine  14  HD Safety Checks Performed Yes  Dialysis Fluid Bolus Normal Saline  Bolus Amount (mL) 250 mL  Intra-Hemodialysis Comments Tx initiated

## 2018-12-19 NOTE — Progress Notes (Signed)
HD Tx stopped by MD, K at acceptable level on redrawn, concerned pt's Hgb 6.5, bleeding precaution, Primary RN notified.    12/19/18 1530  Vital Signs  Pulse Rate 66  Pulse Rate Source Monitor  Resp 12  BP (!) 100/55  BP Location Right Arm  BP Method Automatic  Patient Position (if appropriate) Lying  Oxygen Therapy  SpO2 99 %  O2 Device Room Air  Pulse Oximetry Type Continuous  During Hemodialysis Assessment  HD Safety Checks Performed Yes  KECN 41.5 KECN  Dialysis Fluid Bolus Normal Saline  Bolus Amount (mL) 250 mL  Intra-Hemodialysis Comments Tx completed

## 2018-12-19 NOTE — Progress Notes (Signed)
[place] [facility] [place] NAME: Ralph Lucero    MR#:  [id number]  DATE OF BIRTH:  [birth year]  SUBJECTIVE:   has hematoma right thigh c/o pain Denies SOB REVIEW OF SYSTEMS:    Review of Systems  Constitutional: Negative for fever, chills weight loss HENT: Negative for ear pain, nosebleeds, congestion, facial swelling, rhinorrhea, neck pain, neck stiffness and ear discharge.   Respiratory: Negative for cough, shortness of breath, wheezing  Cardiovascular: Negative for chest pain, palpitations and leg swelling.  Gastrointestinal: Negative for heartburn, abdominal pain, vomiting, diarrhea or consitpation Genitourinary: Negative for dysuria, urgency, frequency, hematuria Musculoskeletal: Negative for back pain or joint pain Neurological: Negative for dizziness, seizures, syncope, focal weakness,  numbness and headaches.  Hematological: Does not bruise/bleed easily.  Psychiatric/Behavioral: Negative for hallucinations, confusion, dysphoric mood +HEMATOMA right thigh   Tolerating Diet: yes      DRUG ALLERGIES:  No Known Allergies  VITALS:  Blood pressure 104/72, pulse 69, temperature 98.9 F (37.2 C), temperature source Oral, resp. rate 18, height 6' (1.829 m), weight 60.3 kg, SpO2 100 %.  PHYSICAL EXAMINATION:  Constitutional: Appears well-developed and well-nourished. No distress. HENT: Normocephalic. Marland Kitchen Oropharynx is clear and moist.  Eyes: Conjunctivae and EOM are normal. PERRLA, no scleral icterus.  Neck: Normal ROM. Neck supple. No JVD. No tracheal deviation. CVS: RRR, S1/S2 +, no murmurs, no gallops, no carotid bruit.  Pulmonary: Effort and breath sounds normal, no stridor, rhonchi, wheezes, rales.  Abdominal: Soft. BS +,  no distension, tenderness, rebound or guarding.  Musculoskeletal: Normal range of motion. No edema and no tenderness.  Neuro: Alert. CN 2-12 grossly intact. No focal deficits. Skin: Skin is warm and dry.  No rash noted.right thigh hematoma Psychiatric: Normal mood and affect.      LABORATORY PANEL:   CBC Recent Labs  Lab 12/18/18 0648  12/19/18 1211  WBC 5.5  --   --   HGB 9.8*   < > 6.5*  HCT 30.4*   < > 20.4*  PLT 142*  --   --    < > = values in this interval not displayed.   ------------------------------------------------------------------------------------------------------------------  Chemistries  Recent Labs  Lab 12/17/18 2348  12/19/18 0920  NA 143  --  138  K 6.8*   < > 6.0*  CL 100  --  97*  CO2 24  --  26  GLUCOSE 124*  --  112*  BUN 94*  --  59*  CREATININE 16.61*  --  13.03*  CALCIUM 8.2*  --  7.4*  MG 3.0*  --   --   AST 23  --   --   ALT 30  --   --   ALKPHOS 111  --   --   BILITOT 0.8  --   --    < > = values in this interval not displayed.   ------------------------------------------------------------------------------------------------------------------  Cardiac Enzymes No results for input(s): TROPONINI in the last 168 hours. ------------------------------------------------------------------------------------------------------------------  RADIOLOGY:  Ct Abdomen Pelvis Wo Contrast  Result Date: 12/19/2018 CLINICAL DATA:  43 year old male with history of unexplained anemia. Hematoma in the right groin. Evaluate for retroperitoneal hemorrhage. EXAM: CT ABDOMEN AND PELVIS WITHOUT CONTRAST TECHNIQUE: Multidetector CT imaging of the abdomen and pelvis was performed following the standard protocol without IV contrast. COMPARISON:  No priors. FINDINGS: Lower chest: Trace bilateral pleural effusions lying dependently. Hepatobiliary: No suspicious cystic or solid hepatic lesions are confidently identified on today's noncontrast CT examination.  Unenhanced appearance of the gallbladder is normal. Pancreas: No definite pancreatic mass or peripancreatic fluid collections or inflammatory changes are noted on today's noncontrast CT examination. Spleen:  Unremarkable. Adrenals/Urinary Tract: Severe renal atrophy bilaterally. Multiple low-attenuation lesions are noted in both kidneys measuring up to 2.2 cm in the interpolar region of the left kidney, incompletely characterized on today's noncontrast CT examination. Several of these demonstrates some coarse calcifications, most evident in the lower pole of the left kidney. No definite calculi are identified within the collecting system of either kidney, along the course of either ureter, or within the lumen of the urinary bladder. No hydroureteronephrosis. Urinary bladder is nearly completely decompressed, but otherwise unremarkable in appearance. Bilateral adrenal glands are normal in appearance. Stomach/Bowel: Normal appearance of the stomach. No pathologic dilatation of small bowel or colon. Normal appendix. Vascular/Lymphatic: No atherosclerotic calcifications in the abdominal aorta or the pelvic vasculature. Left femoral PermCath with tip terminating in the inferior vena cava adjacent to the level of the renal veins. No lymphadenopathy identified in the abdomen or pelvis. Reproductive: Prostate gland and seminal vesicles are unremarkable in appearance. Other: No high attenuation fluid collection in the peritoneal cavity or retroperitoneum to suggest intraperitoneal or retroperitoneal hemorrhage. No significant volume of ascites. No pneumoperitoneum. Musculoskeletal: There is a large amount of high attenuation fluid in the visualized portions of the upper right thigh, predominantly in the quadriceps and adductor musculature, compatible with a large amount of intramuscular hemorrhage. There are no aggressive appearing lytic or blastic lesions noted in the visualized portions of the skeleton. IMPRESSION: 1. Large intramuscular hemorrhage in the upper right thigh, predominantly in the quadriceps and adductor musculature. 2. No intraperitoneal or retroperitoneal hemorrhage. 3. Severe renal atrophy bilaterally with  multiple small predominantly low-attenuation lesions in the kidneys bilaterally, incompletely characterized on today's noncontrast CT examination. Several of these are partially calcified. These are likely to represent cysts associated with chronic renal failure and history of dialysis. If further characterization is desired, nonemergent outpatient MRI of the abdomen with and without IV gadolinium could be performed in the future. 4. Trace bilateral pleural effusions. Electronically Signed   By: Vinnie Langton M.D.   On: 12/19/2018 09:17   Dg Chest Portable 1 View  Result Date: 12/18/2018 CLINICAL DATA:  Shortness of breath EXAM: PORTABLE CHEST 1 VIEW COMPARISON:  None. FINDINGS: There is mild cardiomegaly. Pulmonary vascular congestion and increased interstitial markings seen throughout both lungs. No acute osseous abnormality. IMPRESSION: Cardiomegaly and interstitial edema. Electronically Signed   By: Prudencio Pair M.D.   On: 12/18/2018 00:26     ASSESSMENT AND PLAN:   43 year old male with end-stage renal disease on hemodialysis and hypertension who presented to the emergency room due to shortness of breath after missing several dialysis sessions.  1.  Acute hypoxic respiratory failure due to fluid overload from underlying end-stage renal disease. Wean oxygen as tolerated  2.  End-stage renal disease with hyperkalemia and complication of dialysis device with thrombosis of AV access: Status post angiography and placement of temporary dialysis catheter  3.  CAD with elevated troponin due to poor renal clearance.  Patient ruled out for ACS.  Patient evaluated by cardiology.  Will need to continue dual antiplatelet therapy with aspirin and Brilinta following STEMI May 2020 if hgb stable with hematoma NEEDS BB (once blood pressure has improved) and statin Patient on metoprolol, lisinopril, isosorbide and felodipine at home  4.  Acute on chronic anemia of disease: Hemoglobin 6.5 today due to  hematoma May  require blood transfusion Epogen with dialysis   D/w kolloru Management plans discussed with the patient and he is in agreement.  CODE STATUS: full  TOTAL TIME TAKING CARE OF THIS PATIENT: 30 minutes.     POSSIBLE D/C 2-4 days, DEPENDING ON CLINICAL CONDITION.   Bettey Costa M.D on 12/19/2018 at 12:32 PM  Between 7am to 6pm - Pager - [phone] After 6pm go to [email or web address] - password [email or web address] [facility]  Office  [phone]  CC: Primary care physician; Anthonette Legato, MD  Note: This dictation was prepared with Dragon dictation along with smaller phrase technology. Any transcriptional errors that result from this process are unintentional.

## 2018-12-19 NOTE — Consult Note (Signed)
[place] for Heparin Infusion Dosing and Monitoring  Indication: chest pain/ACS  No Known Allergies  Patient Measurements: Height: 6' (182.9 cm) Weight: 132 lb 15 oz (60.3 kg) IBW/kg (Calculated) : 77.6  Vital Signs: Temp: 98.3 F (36.8 C) (09/08 1947) Temp Source: Oral (09/08 1613) BP: 94/66 (09/09 0200) Pulse Rate: 83 (09/09 0200)  Labs: Recent Labs    12/17/18 2348 12/18/18 0339 12/18/18 0648 12/18/18 2213  HGB 11.1*  --  9.8*  --   HCT 34.9*  --  30.4*  --   PLT 140*  --  142*  --   APTT  --   --  33  --   LABPROT  --   --  13.8  --   INR  --   --  1.1  --   HEPARINUNFRC  --   --   --  0.33  CREATININE 16.61*  --   --   --   TROPONINIHS 36* 270*  --   --     Estimated Creatinine Clearance: 4.9 mL/min (A) (by C-G formula based on SCr of 16.61 mg/dL (H)).   Medical History: Past Medical History:  Diagnosis Date  . ESRD (end stage renal disease) on dialysis ([place])   . Hypertension   . Renal disorder     Assessment: Pharmacy consulted for heparin infusion dosing and monitoring for ACS/NSTEMI in 43 yo male. Patient missed dialysis on 9/5 due to inability to access AV fistula. Heparin drip not initiated this am due to vascular placing temporary dialysis access. Per vascular, can initiate heparin infusion with no bolus. Per cards, patient to be medically managed.   Goal of Therapy:  Heparin level 0.3-0.7 units/ml Monitor platelets by anticoagulation protocol: Yes   Plan:  09/08 @ 2200 HL 0.33 therapeutic. Will continue current rate and will recheck HL w/ am labs.  Pharmacy will continue to monitor and adjust per consult.   Tobie Lords, PharmD, BCPS Clinical Pharmacist 12/19/2018 2:24 AM

## 2018-12-19 NOTE — Progress Notes (Signed)
NA reported pt bg @ 54.  Pt had one prior hypoglycemic event today.  RN pushed 1 amp of D50.  Pt bg resulted 119.  On second recheck, pt bg is 104.  MD notified.  MD order D10 drip continuously.

## 2018-12-19 NOTE — Progress Notes (Signed)
Pre HD Tx   12/19/18 1300  Hand-Off documentation  Report given to (Full Name) Beatris Ship, RN   Report received from (Full Name) Mila Merry, RN   Vital Signs  Temp 98.4 F (36.9 C)  Temp Source Oral  Pulse Rate Source Monitor  Resp 18  BP 110/64  Oxygen Therapy  O2 Device Room Air  Pulse Oximetry Type Continuous  Pain Assessment  Pain Scale 0-10  Pain Score 9  Pain Location Leg  Pain Intervention(s) MD notified (Comment)  Dialysis Weight  Weight 60.1 kg  Type of Weight Pre-Dialysis  Time-Out for Hemodialysis  What Procedure? HD  Pt Identifiers(min of two) First/Last Name;MRN/Account#  Correct Site? Yes  Correct Side? Yes  Correct Procedure? Yes  Consents Verified? Yes  Rad Studies Available? N/A  Safety Precautions Reviewed? Yes  Engineer, civil (consulting) Number 7  Station Number 2  UF/Alarm Test Passed  Conductivity: Meter 13.8  Conductivity: Machine  13.8  pH 7.4  Reverse Osmosis Main  Normal Saline Lot Number [id number]  Dialyzer Lot Number [id number]  Disposable Set Lot Number [id number]  Machine Temperature 98.6 F (37 C)  Musician and Audible Yes  Blood Lines Intact and Secured Yes  Pre Treatment Patient Checks  Vascular access used during treatment Catheter  HD catheter dressing before treatment WDL  Hepatitis B Surface Antigen Results Negative  Date Hepatitis B Surface Antigen Drawn 12/03/09  Hepatitis B Surface Antibody 294  Date Hepatitis B Surface Antibody Drawn 01/23/18  Hemodialysis Consent Verified Yes  Hemodialysis Standing Orders Initiated Yes  ECG (Telemetry) Monitor On Yes  Prime Ordered Normal Saline  Length of  DialysisTreatment -hour(s) 3 Hour(s)  Dialysis Treatment Comments NA 140 (Extra TX to remove excess Potassium. )  Dialyzer Elisio 17H NR  Dialysate 1K;2.5 Ca  Dialysis Anticoagulant None  Dialysate Flow Ordered 600  Blood Flow Rate Ordered 400 mL/min  Ultrafiltration Goal 0 Liters (Verbal order change )  Dialysis Blood  Pressure Support Ordered Normal Saline  Education / Care Plan  Dialysis Education Provided Yes  Documented Education in Care Plan Yes  Hemodialysis Catheter Left Femoral vein Triple lumen Temporary (Non-Tunneled)  Placement Date/Time: 12/18/18 1222   Placed prior to admission: No  Time Out: Correct patient;Correct procedure;Correct site  Maximum sterile barrier precautions: Hand hygiene;Sterile gown;Sterile gloves;Cap;Mask;Large sterile sheet  Site Prep: Chlorh...  Site Condition No complications  Blue Lumen Status Blood return noted  Red Lumen Status Blood return noted  Dressing Type Biopatch;Occlusive  Dressing Status Clean;Dry;Intact

## 2018-12-19 NOTE — Care Plan (Signed)
After further assessment and discussion of patient's  status and medical condition during multidisciplinary rounds, the plan is outlined as below:    1.  Discussed with Dr. Juleen China and with the multidisciplinary team.  Dialysis today due to hyperkalemia.  Hyperkalemia may be resulting from reabsorption of hematoma.  2.  Acute blood loss anemia superimposed on anemia of chronic kidney disease transfuse as needed.  3.  Obtain CT abdomen and pelvis to evaluate extent of hematoma.  4.  Discontinue all anticoagulants consider DDAVP.  Renold Don, MD [place] [facility]

## 2018-12-19 NOTE — Progress Notes (Signed)
Pt is now receiving 1u PRBC for Hgb of 5.5, he has been c/o nausea and "heartburn", tums. zofran and protonix all given

## 2018-12-19 NOTE — Progress Notes (Signed)
Post HD Tx    12/19/18 1545  Hand-Off documentation  Report given to (Full Name) Mila Merry, RN   Report received from (Full Name) Beatris Ship, RN   Vital Signs  Temp 98.4 F (36.9 C)  Temp Source Oral  Pulse Rate 70  Pulse Rate Source Monitor  Resp 16  BP (!) 97/55  BP Location Right Arm  BP Method Automatic  Patient Position (if appropriate) Lying  Oxygen Therapy  SpO2 100 %  O2 Device Room Air  Pulse Oximetry Type Continuous  Pain Assessment  Pain Scale 0-10  Pain Score 0  Dialysis Weight  Weight 60.5 kg  Type of Weight Post-Dialysis  Post-Hemodialysis Assessment  Rinseback Volume (mL) 250 mL  KECN 41.5 V  Dialyzer Clearance Lightly streaked  Duration of HD Treatment -hour(s) 2.25 hour(s) (MD verbal order to end tx, repeat K 3.1 acceptable )  Hemodialysis Intake (mL) 500 mL  UF Total -Machine (mL) 18 mL  Net UF (mL) -482 mL  Tolerated HD Treatment Yes  Hemodialysis Catheter Left Femoral vein Triple lumen Temporary (Non-Tunneled)  Placement Date/Time: 12/18/18 1222   Placed prior to admission: No  Time Out: Correct patient;Correct procedure;Correct site  Maximum sterile barrier precautions: Hand hygiene;Sterile gown;Sterile gloves;Cap;Mask;Large sterile sheet  Site Prep: Chlorh...  Site Condition No complications  Post treatment catheter status Capped and Clamped

## 2018-12-19 NOTE — Plan of Care (Signed)
  Problem: Education: Goal: Knowledge of General Education information will improve Description: Including pain rating scale, medication(s)/side effects and non-pharmacologic comfort measures Outcome: Progressing   Problem: Health Behavior/Discharge Planning: Goal: Ability to manage health-related needs will improve Outcome: Progressing   Problem: Clinical Measurements: Goal: Ability to maintain clinical measurements within normal limits will improve Outcome: Progressing Goal: Will remain free from infection Outcome: Progressing Goal: Diagnostic test results will improve Outcome: Progressing Goal: Respiratory complications will improve Outcome: Progressing   Problem: Nutrition: Goal: Adequate nutrition will be maintained Outcome: Progressing   Problem: Coping: Goal: Level of anxiety will decrease Outcome: Progressing   Problem: Elimination: Goal: Will not experience complications related to bowel motility Outcome: Progressing   Problem: Pain Managment: Goal: General experience of comfort will improve Outcome: Progressing   Problem: Safety: Goal: Ability to remain free from injury will improve Outcome: Progressing   Problem: Skin Integrity: Goal: Risk for impaired skin integrity will decrease Outcome: Progressing

## 2018-12-19 NOTE — Progress Notes (Signed)
[facility]  ROUNDING NOTE   Subjective:   Hemodialysis treatment yesterday via left femoral temp catheter. Tolerated treatment well. UF of even.   History taken with assistance of Spanish video interpreter   Patient complains of right thigh hematoma.   Potassium is 6.   Objective:  Vital signs in last 24 hours:  Temp:  [98.3 F (36.8 C)-99.8 F (37.7 C)] 98.9 F (37.2 C) (09/09 0800) Pulse Rate:  [60-83] 69 (09/09 0900) Resp:  [13-27] 18 (09/09 0700) BP: (67-119)/(47-87) 104/72 (09/09 0900) SpO2:  [96 %-100 %] 100 % (09/09 0900) Weight:  [60.1 kg-60.3 kg] 60.3 kg (09/08 1947)  Weight change: 0 kg Filed Weights   12/17/18 2359 12/18/18 1613 12/18/18 1947  Weight: 60.1 kg 60.1 kg 60.3 kg    Intake/Output: I/O last 3 completed shifts: In: 109.1 [I.V.:59.1; IV Piggyback:50] Out: 227 [Other:227]   Intake/Output this shift:  Total I/O In: 52.3 [I.V.:52.3] Out: -   Physical Exam: General: Laying in bed  Head: Normocephalic, atraumatic. Moist oral mucosal membranes  Eyes: Anicteric, PERRL, +pterygium  Neck: Supple, trachea midline  Lungs:  clear  Heart: Regular rate and rhythm  Abdomen:  Soft, nontender,   Extremities: no peripheral edema.  Neurologic: sedated  Skin: No lesions  Access: Left AVF + thrill, +bruit,  Left femoral temp HD catheter Dr. Delana Meyer 9/8    Basic Metabolic Panel: Recent Labs  Lab 12/17/18 2348 12/18/18 0339  NA 143  --   K 6.8* 6.0*  CL 100  --   CO2 24  --   GLUCOSE 124*  --   BUN 94*  --   CREATININE 16.61*  --   CALCIUM 8.2*  --   MG 3.0*  --     Liver Function Tests: Recent Labs  Lab 12/17/18 2348  AST 23  ALT 30  ALKPHOS 111  BILITOT 0.8  PROT 8.0  ALBUMIN 4.5   No results for input(s): LIPASE, AMYLASE in the last 168 hours. No results for input(s): AMMONIA in the last 168 hours.  CBC: Recent Labs  Lab 12/17/18 2348 12/18/18 0648 12/19/18 0449  WBC 9.6 5.5  --   NEUTROABS 7.7 4.2  --   HGB  11.1* 9.8* 7.6*  HCT 34.9* 30.4* 23.9*  MCV 97.5 96.8  --   PLT 140* 142*  --     Cardiac Enzymes: No results for input(s): CKTOTAL, CKMB, CKMBINDEX, TROPONINI in the last 168 hours.  BNP: Invalid input(s): POCBNP  CBG: Recent Labs  Lab 12/18/18 2322 12/19/18 0420 12/19/18 0517 12/19/18 0613 12/19/18 0806  GLUCAP 164* 52* 119* 104* 39*    Microbiology: Results for orders placed or performed during the hospital encounter of 12/17/18  SARS Coronavirus 2 [facility] order, Performed in [facility] hospital lab) Nasopharyngeal Nasopharyngeal Swab     Status: None   Collection Time: 12/18/18 12:31 AM   Specimen: Nasopharyngeal Swab  Result Value Ref Range Status   SARS Coronavirus 2 NEGATIVE NEGATIVE Final    Comment: (NOTE) If result is NEGATIVE SARS-CoV-2 target nucleic acids are NOT DETECTED. The SARS-CoV-2 RNA is generally detectable in upper and lower  respiratory specimens during the acute phase of infection. The lowest  concentration of SARS-CoV-2 viral copies this assay can detect is 250  copies / mL. A negative result does not preclude SARS-CoV-2 infection  and should not be used as the sole basis for treatment or other  patient management decisions.  A negative result may occur with  improper  specimen collection / handling, submission of specimen other  than nasopharyngeal swab, presence of viral mutation(s) within the  areas targeted by this assay, and inadequate number of viral copies  (<250 copies / mL). A negative result must be combined with clinical  observations, patient history, and epidemiological information. If result is POSITIVE SARS-CoV-2 target nucleic acids are DETECTED. The SARS-CoV-2 RNA is generally detectable in upper and lower  respiratory specimens dur ing the acute phase of infection.  Positive  results are indicative of active infection with SARS-CoV-2.  Clinical  correlation with patient history and other diagnostic information is   necessary to determine patient infection status.  Positive results do  not rule out bacterial infection or co-infection with other viruses. If result is PRESUMPTIVE POSTIVE SARS-CoV-2 nucleic acids MAY BE PRESENT.   A presumptive positive result was obtained on the submitted specimen  and confirmed on repeat testing.  While 2019 novel coronavirus  (SARS-CoV-2) nucleic acids may be present in the submitted sample  additional confirmatory testing may be necessary for epidemiological  and / or clinical management purposes  to differentiate between  SARS-CoV-2 and other Sarbecovirus currently known to infect humans.  If clinically indicated additional testing with an alternate test  methodology [phone]) is advised. The SARS-CoV-2 RNA is generally  detectable in upper and lower respiratory sp ecimens during the acute  phase of infection. The expected result is Negative. Fact Sheet for Patients:  StrictlyIdeas.no Fact Sheet for Healthcare Providers: BankingDealers.co.za This test is not yet approved or cleared by the [place] FDA and has been authorized for detection and/or diagnosis of SARS-CoV-2 by FDA under an Emergency Use Authorization (EUA).  This EUA will remain in effect (meaning this test can be used) for the duration of the COVID-19 declaration under Section 564(b)(1) of the Act, 21 U.S.C. section 360bbb-3(b)(1), unless the authorization is terminated or revoked sooner. Performed at [facility], [address] [place], [place] [address]   MRSA PCR Screening     Status: None   Collection Time: 12/18/18  2:32 PM   Specimen: Nasal Mucosa; Nasopharyngeal  Result Value Ref Range Status   MRSA by PCR NEGATIVE NEGATIVE Final    Comment:        The GeneXpert MRSA Assay (FDA approved for NASAL specimens only), is one component of a comprehensive MRSA colonization surveillance program. It is not intended to diagnose  MRSA infection nor to guide or monitor treatment for MRSA infections. Performed at [facility], [address] [place], [place] [address]     Coagulation Studies: Recent Labs    12/18/18 0648  LABPROT 13.8  INR 1.1    Urinalysis: No results for input(s): COLORURINE, LABSPEC, PHURINE, GLUCOSEU, HGBUR, BILIRUBINUR, KETONESUR, PROTEINUR, UROBILINOGEN, NITRITE, LEUKOCYTESUR in the last 72 hours.  Invalid input(s): APPERANCEUR    Imaging: Ct Abdomen Pelvis Wo Contrast  Result Date: 12/19/2018 CLINICAL DATA:  43 year old male with history of unexplained anemia. Hematoma in the right groin. Evaluate for retroperitoneal hemorrhage. EXAM: CT ABDOMEN AND PELVIS WITHOUT CONTRAST TECHNIQUE: Multidetector CT imaging of the abdomen and pelvis was performed following the standard protocol without IV contrast. COMPARISON:  No priors. FINDINGS: Lower chest: Trace bilateral pleural effusions lying dependently. Hepatobiliary: No suspicious cystic or solid hepatic lesions are confidently identified on today's noncontrast CT examination. Unenhanced appearance of the gallbladder is normal. Pancreas: No definite pancreatic mass or peripancreatic fluid collections or inflammatory changes are noted on today's noncontrast CT examination. Spleen: Unremarkable. Adrenals/Urinary Tract: Severe renal  atrophy bilaterally. Multiple low-attenuation lesions are noted in both kidneys measuring up to 2.2 cm in the interpolar region of the left kidney, incompletely characterized on today's noncontrast CT examination. Several of these demonstrates some coarse calcifications, most evident in the lower pole of the left kidney. No definite calculi are identified within the collecting system of either kidney, along the course of either ureter, or within the lumen of the urinary bladder. No hydroureteronephrosis. Urinary bladder is nearly completely decompressed, but otherwise unremarkable in appearance. Bilateral adrenal  glands are normal in appearance. Stomach/Bowel: Normal appearance of the stomach. No pathologic dilatation of small bowel or colon. Normal appendix. Vascular/Lymphatic: No atherosclerotic calcifications in the abdominal aorta or the pelvic vasculature. Left femoral PermCath with tip terminating in the inferior vena cava adjacent to the level of the renal veins. No lymphadenopathy identified in the abdomen or pelvis. Reproductive: Prostate gland and seminal vesicles are unremarkable in appearance. Other: No high attenuation fluid collection in the peritoneal cavity or retroperitoneum to suggest intraperitoneal or retroperitoneal hemorrhage. No significant volume of ascites. No pneumoperitoneum. Musculoskeletal: There is a large amount of high attenuation fluid in the visualized portions of the upper right thigh, predominantly in the quadriceps and adductor musculature, compatible with a large amount of intramuscular hemorrhage. There are no aggressive appearing lytic or blastic lesions noted in the visualized portions of the skeleton. IMPRESSION: 1. Large intramuscular hemorrhage in the upper right thigh, predominantly in the quadriceps and adductor musculature. 2. No intraperitoneal or retroperitoneal hemorrhage. 3. Severe renal atrophy bilaterally with multiple small predominantly low-attenuation lesions in the kidneys bilaterally, incompletely characterized on today's noncontrast CT examination. Several of these are partially calcified. These are likely to represent cysts associated with chronic renal failure and history of dialysis. If further characterization is desired, nonemergent outpatient MRI of the abdomen with and without IV gadolinium could be performed in the future. 4. Trace bilateral pleural effusions. Electronically Signed   By: Vinnie Langton M.D.   On: 12/19/2018 09:17   Dg Chest Portable 1 View  Result Date: 12/18/2018 CLINICAL DATA:  Shortness of breath EXAM: PORTABLE CHEST 1 VIEW  COMPARISON:  None. FINDINGS: There is mild cardiomegaly. Pulmonary vascular congestion and increased interstitial markings seen throughout both lungs. No acute osseous abnormality. IMPRESSION: Cardiomegaly and interstitial edema. Electronically Signed   By: Prudencio Pair M.D.   On: 12/18/2018 00:26     Medications:   . dextrose 25 mL/hr at 12/19/18 0905   . Chlorhexidine Gluconate Cloth  6 each Topical Q0600  . docusate sodium  100 mg Oral BID  . fentaNYL (SUBLIMAZE) injection  50 mcg Intravenous Once  . lidocaine  5 mL Intradermal Once   acetaminophen **OR** acetaminophen, fentaNYL (SUBLIMAZE) injection, heparin, ondansetron **OR** ondansetron (ZOFRAN) IV  Assessment/ Plan:  Mr. Drazen Eckert is a 43 y.o. Hispanic Spanish Speaking only male with end stage renal disease on hemodialysis followed by [facility], hypertension, coronary artery disease, secondary hyperparathyroidism status post parathyroidectomy who presents to [facility] with hyperkalemia and pulmonary edema.   [facility] TTS [place] Left AVF  1. End Stage Renal Disease with hyperkalemia: complication with dialysis device.  - Appreciate vascular input.  - Dialysis again for today due to hyperkalemia. Orders prepared.   2. Hypertension: history of difficult to control. Hypertensive urgency on admission.  Home regimen of metoprolol, lisinopril, isosorbide mononitrate and felodipine.  Currently holding home bp meds.   3. Anemia of chronic kidney disease: hemoglobin 7.6. Secondary to hematoma - EPO  with HD treatment.   4. Secondary Hyperparathyroidism status post parathyroidectomy  - calcium acetate with meals.     LOS: 1 Ralph Lucero 9/9/20209:43 AM

## 2018-12-19 NOTE — Progress Notes (Signed)
Pt's CBG is 60, hypoglycemic protocol followed and D50 25mg  given, pt is c/o heartburn also, tums ordered

## 2018-12-19 NOTE — Consult Note (Signed)
[place] for Heparin Infusion Dosing and Monitoring  Indication: chest pain/ACS  No Known Allergies  Patient Measurements: Height: 6' (182.9 cm) Weight: 132 lb 15 oz (60.3 kg) IBW/kg (Calculated) : 77.6  Vital Signs: Temp: 99.8 F (37.7 C) (09/09 0300) Temp Source: Oral (09/09 0300) BP: 96/63 (09/09 0300) Pulse Rate: 79 (09/09 0300)  Labs: Recent Labs    12/17/18 2348 12/18/18 0339 12/18/18 0648 12/18/18 2213 12/19/18 0449  HGB 11.1*  --  9.8*  --  7.6*  HCT 34.9*  --  30.4*  --  23.9*  PLT 140*  --  142*  --   --   APTT  --   --  33  --   --   LABPROT  --   --  13.8  --   --   INR  --   --  1.1  --   --   HEPARINUNFRC  --   --   --  0.33 0.35  CREATININE 16.61*  --   --   --   --   TROPONINIHS 36* 270*  --   --   --     Estimated Creatinine Clearance: 4.9 mL/min (A) (by C-G formula based on SCr of 16.61 mg/dL (H)).   Medical History: Past Medical History:  Diagnosis Date  . ESRD (end stage renal disease) on dialysis ([place])   . Hypertension   . Renal disorder     Assessment: Pharmacy consulted for heparin infusion dosing and monitoring for ACS/NSTEMI in 43 yo male. Patient missed dialysis on 9/5 due to inability to access AV fistula. Heparin drip not initiated this am due to vascular placing temporary dialysis access. Per vascular, can initiate heparin infusion with no bolus. Per cards, patient to be medically managed.   Goal of Therapy:  Heparin level 0.3-0.7 units/ml Monitor platelets by anticoagulation protocol: Yes   Plan:  09/09 @ 0500 HL 0.35 therapeutic. Will continue current rate and will recheck HL w/ am labs. H/h trended down this am, will continue to monitor.  Pharmacy will continue to monitor and adjust per consult.   Tobie Lords, PharmD, BCPS Clinical Pharmacist 12/19/2018 6:09 AM

## 2018-12-19 NOTE — Progress Notes (Signed)
Transfusion complete with no reaction, epigastric pain and burn resolved

## 2018-12-19 NOTE — Progress Notes (Signed)
Pt is complaining of 8/10 pain in right lower leg. Pt is unable to describe the pain.  Pt is grimacing, guarding, and moaning out in pain. RN administered  PRN pain medication.  Pt is resting comfortably no.  Vitals are stable

## 2018-12-19 NOTE — Progress Notes (Signed)
Pt saturation is sustaining at 87% while sleeping.  RN place pt on 2L [place]. O2 sats are now 100% at rest.

## 2018-12-20 LAB — CBC
HCT: 21.9 % — ABNORMAL LOW (ref 39.0–52.0)
HCT: 24.3 % — ABNORMAL LOW (ref 39.0–52.0)
Hemoglobin: 6.9 g/dL — ABNORMAL LOW (ref 13.0–17.0)
Hemoglobin: 8 g/dL — ABNORMAL LOW (ref 13.0–17.0)
MCH: 30.7 pg (ref 26.0–34.0)
MCH: 31.4 pg (ref 26.0–34.0)
MCHC: 31.5 g/dL (ref 30.0–36.0)
MCHC: 32.9 g/dL (ref 30.0–36.0)
MCV: 97.3 fL (ref 80.0–100.0)
MCV: 95.3 fL (ref 80.0–100.0)
Platelets: 83 10*3/uL — ABNORMAL LOW (ref 150–400)
Platelets: 70 10*3/uL — ABNORMAL LOW (ref 150–400)
RBC: 2.25 MIL/uL — ABNORMAL LOW (ref 4.22–5.81)
RBC: 2.55 MIL/uL — ABNORMAL LOW (ref 4.22–5.81)
RDW: 15.6 % — ABNORMAL HIGH (ref 11.5–15.5)
RDW: 15 % (ref 11.5–15.5)
WBC: 5.9 10*3/uL (ref 4.0–10.5)
WBC: 5.6 10*3/uL (ref 4.0–10.5)
nRBC: 0 % (ref 0.0–0.2)
nRBC: 0 % (ref 0.0–0.2)

## 2018-12-20 LAB — RENAL FUNCTION PANEL
Albumin: 3.3 g/dL — ABNORMAL LOW (ref 3.5–5.0)
Anion gap: 11 (ref 5–15)
BUN: 34 mg/dL — ABNORMAL HIGH (ref 6–20)
CO2: 28 mmol/L (ref 22–32)
Calcium: 7.5 mg/dL — ABNORMAL LOW (ref 8.9–10.3)
Chloride: 100 mmol/L (ref 98–111)
Creatinine, Ser: 9.07 mg/dL — ABNORMAL HIGH (ref 0.61–1.24)
GFR calc Af Amer: 7 mL/min — ABNORMAL LOW (ref >60)
GFR calc non Af Amer: 6 mL/min — ABNORMAL LOW (ref >60)
Glucose, Bld: 119 mg/dL — ABNORMAL HIGH (ref 70–99)
Phosphorus: 4.5 mg/dL (ref 2.5–4.6)
Potassium: 4.3 mmol/L (ref 3.5–5.1)
Sodium: 139 mmol/L (ref 135–145)

## 2018-12-20 LAB — CBC WITH DIFFERENTIAL/PLATELET
Abs Immature Granulocytes: 0.03 10*3/uL (ref 0.00–0.07)
Basophils Absolute: 0 10*3/uL (ref 0.0–0.1)
Basophils Relative: 0 %
Eosinophils Absolute: 0.2 10*3/uL (ref 0.0–0.5)
Eosinophils Relative: 3 %
HCT: 21.2 % — ABNORMAL LOW (ref 39.0–52.0)
Hemoglobin: 7 g/dL — ABNORMAL LOW (ref 13.0–17.0)
Immature Granulocytes: 0 %
Lymphocytes Relative: 14 %
Lymphs Abs: 1 10*3/uL (ref 0.7–4.0)
MCH: 31.5 pg (ref 26.0–34.0)
MCHC: 33 g/dL (ref 30.0–36.0)
MCV: 95.5 fL (ref 80.0–100.0)
Monocytes Absolute: 0.8 10*3/uL (ref 0.1–1.0)
Monocytes Relative: 11 %
Neutro Abs: 5.1 10*3/uL (ref 1.7–7.7)
Neutrophils Relative %: 72 %
Platelets: 90 10*3/uL — ABNORMAL LOW (ref 150–400)
RBC: 2.22 MIL/uL — ABNORMAL LOW (ref 4.22–5.81)
RDW: 15.2 % (ref 11.5–15.5)
WBC: 7.1 10*3/uL (ref 4.0–10.5)
nRBC: 0 % (ref 0.0–0.2)

## 2018-12-20 LAB — HEMOGLOBIN AND HEMATOCRIT, BLOOD
HCT: 20.3 % — ABNORMAL LOW (ref 39.0–52.0)
Hemoglobin: 6.6 g/dL — ABNORMAL LOW (ref 13.0–17.0)

## 2018-12-20 LAB — GLUCOSE, CAPILLARY
Glucose-Capillary: 120 mg/dL — ABNORMAL HIGH (ref 70–99)
Glucose-Capillary: 124 mg/dL — ABNORMAL HIGH (ref 70–99)
Glucose-Capillary: 102 mg/dL — ABNORMAL HIGH (ref 70–99)

## 2018-12-20 LAB — PREPARE RBC (CROSSMATCH)

## 2018-12-20 MED ORDER — PANTOPRAZOLE SODIUM 40 MG PO TBEC
40.0000 mg | DELAYED_RELEASE_TABLET | Freq: Every day | ORAL | Status: DC
  Administered 2018-12-20: 40 mg via ORAL
  Filled 2018-12-20: qty 1

## 2018-12-20 MED ORDER — NEPRO/CARBSTEADY PO LIQD
237.0000 mL | Freq: Two times a day (BID) | ORAL | Status: DC
  Administered 2018-12-20 – 2018-12-27 (×9): 237 mL via ORAL

## 2018-12-20 MED ORDER — SODIUM CHLORIDE 0.9% IV SOLUTION
Freq: Once | INTRAVENOUS | Status: AC
  Administered 2018-12-20: 08:00:00 via INTRAVENOUS

## 2018-12-20 MED ORDER — EPOETIN ALFA 10000 UNIT/ML IJ SOLN
10000.0000 [IU] | INTRAMUSCULAR | Status: DC
  Administered 2018-12-22 – 2018-12-27 (×4): 10000 [IU] via INTRAVENOUS
  Filled 2018-12-20: qty 1

## 2018-12-20 MED ORDER — EPOETIN ALFA 10000 UNIT/ML IJ SOLN
10000.0000 [IU] | Freq: Once | INTRAMUSCULAR | Status: AC
  Administered 2018-12-20: 10000 [IU] via INTRAVENOUS

## 2018-12-20 MED ORDER — MORPHINE SULFATE (PF) 2 MG/ML IV SOLN
INTRAVENOUS | Status: AC
  Administered 2018-12-20: 1 mg via INTRAVENOUS
  Filled 2018-12-20: qty 1

## 2018-12-20 MED ORDER — RENA-VITE PO TABS
1.0000 | ORAL_TABLET | Freq: Every day | ORAL | Status: DC
  Administered 2018-12-20 – 2018-12-26 (×6): 1 via ORAL
  Filled 2018-12-20 (×6): qty 1

## 2018-12-20 MED ORDER — SODIUM CHLORIDE 0.9 % IV SOLN
100.0000 mg | Freq: Once | INTRAVENOUS | Status: AC
  Administered 2018-12-20: 100 mg via INTRAVENOUS
  Filled 2018-12-20: qty 5

## 2018-12-20 MED ORDER — MORPHINE SULFATE (PF) 2 MG/ML IV SOLN
1.0000 mg | Freq: Once | INTRAVENOUS | Status: AC
  Administered 2018-12-20: 1 mg via INTRAVENOUS

## 2018-12-20 NOTE — Care Plan (Signed)
After further assessment and discussion of patient's  status and medical condition during multidisciplinary rounds, the plan is outlined as below:  1.  Discussed with Dr. Juleen China and with the multidisciplinary team.  Dialysis today to keep on his regular schedule.  2.  Acute blood loss anemia superimposed on anemia of chronic kidney disease transfuse 1 unit of blood today.  3.  No evidence of retroperitoneal hematoma.  With holding anticoagulants and antiplatelet agents for now resume once H&H does not drift any further.  4.  Dr. Juleen China to discuss with vascular with regards to AV fistula which is currently not working.    Renold Don, MD [place] [facility]

## 2018-12-20 NOTE — Progress Notes (Signed)
PHARMACIST - PHYSICIAN COMMUNICATION  CONCERNING: IV to Oral Route Change Policy  RECOMMENDATION: This patient is receiving pantoprazole by the intravenous route.  Based on criteria approved by the Pharmacy and Therapeutics Committee, the intravenous medication(s) is/are being converted to the equivalent oral dose form(s).   DESCRIPTION: These criteria include:  The patient is eating (either orally or via tube) and/or has been taking other orally administered medications for a least 24 hours  The patient has no evidence of active gastrointestinal bleeding or impaired GI absorption (gastrectomy, short bowel, patient on TNA or NPO).  If you have questions about this conversion, please contact the Pharmacy Department at [phone].    Ralph Lucero L, [facility] 12/20/2018 1:06 PM

## 2018-12-20 NOTE — Progress Notes (Signed)
Pt's Hgb has dropped to 6.6, Weldon Picking NP is ordering another unit of PRBC

## 2018-12-20 NOTE — Progress Notes (Signed)
DIALYSIS COMPLETE TOLERATED WELL REMOVED 1.5L AS ORDERED, NO S/S OF DISTRESS TO NOTE, L FEMORAL CATHETER NO PROBLEMS   12/20/18 1345  Vital Signs  Pulse Rate 81  Resp 16  BP 114/83  Oxygen Therapy  SpO2 100 %  During Hemodialysis Assessment  Blood Flow Rate (mL/min) 150 mL/min  Arterial Pressure (mmHg) 10 mmHg  Venous Pressure (mmHg) 50 mmHg  Transmembrane Pressure (mmHg) 60 mmHg  Ultrafiltration Rate (mL/min) 0 mL/min  Dialysate Flow Rate (mL/min) 800 ml/min  Conductivity: Machine  14.1  HD Safety Checks Performed Yes  Dialysis Fluid Bolus Normal Saline  Bolus Amount (mL) 250 mL  Intra-Hemodialysis Comments Tolerated well;Tx completed  Post-Hemodialysis Assessment  Rinseback Volume (mL) 250 mL  KECN 68.8 V  Dialyzer Clearance Lightly streaked  Duration of HD Treatment -hour(s) 3.5 hour(s)  Hemodialysis Intake (mL) 500 mL  UF Total -Machine (mL) 2400 mL  Net UF (mL) 1900 mL  Tolerated HD Treatment Yes

## 2018-12-20 NOTE — Progress Notes (Signed)
[facility]  ROUNDING NOTE   Subjective:   Seen and examined on hemodialysis treatment. Tolerating treatment well. Using left femoral catheter.   History taken with assistance of Spanish video interpreter   Patient complains of right thigh hematoma.   Status post PRBC transfusion. Plan for second transfusion today.   Extra hemodialysis treatment yesterday due to hyeprkalemia.   Objective:  Vital signs in last 24 hours:  Temp:  [98.4 F (36.9 C)-100.2 F (37.9 C)] 98.7 F (37.1 C) (09/10 1215) Pulse Rate:  [63-102] 79 (09/10 1245) Resp:  [9-21] 14 (09/10 1245) BP: (88-128)/(52-89) 111/80 (09/10 1245) SpO2:  [93 %-100 %] 100 % (09/10 1245) Weight:  [60.1 kg-60.5 kg] 60.5 kg (09/09 1545)  Weight change: 0 kg Filed Weights   12/18/18 1947 12/19/18 1300 12/19/18 1545  Weight: 60.3 kg 60.1 kg 60.5 kg    Intake/Output: I/O last 3 completed shifts: In: 435.3 [P.O.:240; I.V.:195.3] Out: -255    Intake/Output this shift:  Total I/O In: 630 [P.O.:240; Blood:390] Out: -   Physical Exam: General: Laying in bed  Head: Normocephalic, atraumatic. Moist oral mucosal membranes  Eyes: Anicteric, PERRL, +pterygium  Neck: Supple, trachea midline  Lungs:  clear  Heart: Regular rate and rhythm  Abdomen:  Soft, nontender,   Extremities: no peripheral edema. Right thigh hematoma - tender to touch  Neurologic: sedated  Skin: No lesions  Access: Left AVF + thrill, +bruit,  Left femoral temp HD catheter Dr. Delana Meyer 9/8    Basic Metabolic Panel: Recent Labs  Lab 12/17/18 2348 12/18/18 0339 12/19/18 0920 12/19/18 1433 12/20/18 0432  NA 143  --  138  --  139  K 6.8* 6.0* 6.0* 3.4* 4.3  CL 100  --  97*  --  100  CO2 24  --  26  --  28  GLUCOSE 124*  --  112*  --  119*  BUN 94*  --  59*  --  34*  CREATININE 16.61*  --  13.03*  --  9.07*  CALCIUM 8.2*  --  7.4*  --  7.5*  MG 3.0*  --   --   --   --   PHOS  --   --  4.3  --  4.5    Liver Function Tests: Recent  Labs  Lab 12/17/18 2348 12/19/18 0920 12/20/18 0432  AST 23  --   --   ALT 30  --   --   ALKPHOS 111  --   --   BILITOT 0.8  --   --   PROT 8.0  --   --   ALBUMIN 4.5 3.4* 3.3*   No results for input(s): LIPASE, AMYLASE in the last 168 hours. No results for input(s): AMMONIA in the last 168 hours.  CBC: Recent Labs  Lab 12/17/18 2348 12/18/18 0648  12/19/18 1211 12/19/18 1636 12/20/18 0100 12/20/18 0432 12/20/18 1030  WBC 9.6 5.5  --   --   --  7.1  --  5.9  NEUTROABS 7.7 4.2  --   --   --  5.1  --   --   HGB 11.1* 9.8*   < > 6.5* 5.5* 7.0* 6.6* 6.9*  HCT 34.9* 30.4*   < > 20.4* 17.2* 21.2* 20.3* 21.9*  MCV 97.5 96.8  --   --   --  95.5  --  97.3  PLT 140* 142*  --   --   --  90*  --  83*   < > =  values in this interval not displayed.    Cardiac Enzymes: No results for input(s): CKTOTAL, CKMB, CKMBINDEX, TROPONINI in the last 168 hours.  BNP: Invalid input(s): POCBNP  CBG: Recent Labs  Lab 12/19/18 1659 12/19/18 2012 12/19/18 2137 12/20/18 0118 12/20/18 0540  GLUCAP 170* 60* 95 120* 124*    Microbiology: Results for orders placed or performed during the hospital encounter of 12/17/18  SARS Coronavirus 2 [facility] order, Performed in [facility] hospital lab) Nasopharyngeal Nasopharyngeal Swab     Status: None   Collection Time: 12/18/18 12:31 AM   Specimen: Nasopharyngeal Swab  Result Value Ref Range Status   SARS Coronavirus 2 NEGATIVE NEGATIVE Final    Comment: (NOTE) If result is NEGATIVE SARS-CoV-2 target nucleic acids are NOT DETECTED. The SARS-CoV-2 RNA is generally detectable in upper and lower  respiratory specimens during the acute phase of infection. The lowest  concentration of SARS-CoV-2 viral copies this assay can detect is 250  copies / mL. A negative result does not preclude SARS-CoV-2 infection  and should not be used as the sole basis for treatment or other  patient management decisions.  A negative result may occur with  improper  specimen collection / handling, submission of specimen other  than nasopharyngeal swab, presence of viral mutation(s) within the  areas targeted by this assay, and inadequate number of viral copies  (<250 copies / mL). A negative result must be combined with clinical  observations, patient history, and epidemiological information. If result is POSITIVE SARS-CoV-2 target nucleic acids are DETECTED. The SARS-CoV-2 RNA is generally detectable in upper and lower  respiratory specimens dur ing the acute phase of infection.  Positive  results are indicative of active infection with SARS-CoV-2.  Clinical  correlation with patient history and other diagnostic information is  necessary to determine patient infection status.  Positive results do  not rule out bacterial infection or co-infection with other viruses. If result is PRESUMPTIVE POSTIVE SARS-CoV-2 nucleic acids MAY BE PRESENT.   A presumptive positive result was obtained on the submitted specimen  and confirmed on repeat testing.  While 2019 novel coronavirus  (SARS-CoV-2) nucleic acids may be present in the submitted sample  additional confirmatory testing may be necessary for epidemiological  and / or clinical management purposes  to differentiate between  SARS-CoV-2 and other Sarbecovirus currently known to infect humans.  If clinically indicated additional testing with an alternate test  methodology [phone]) is advised. The SARS-CoV-2 RNA is generally  detectable in upper and lower respiratory sp ecimens during the acute  phase of infection. The expected result is Negative. Fact Sheet for Patients:  StrictlyIdeas.no Fact Sheet for Healthcare Providers: BankingDealers.co.za This test is not yet approved or cleared by the [place] FDA and has been authorized for detection and/or diagnosis of SARS-CoV-2 by FDA under an Emergency Use Authorization (EUA).  This EUA will remain in  effect (meaning this test can be used) for the duration of the COVID-19 declaration under Section 564(b)(1) of the Act, 21 U.S.C. section 360bbb-3(b)(1), unless the authorization is terminated or revoked sooner. Performed at [facility], [address] [place], [place] [address]   MRSA PCR Screening     Status: None   Collection Time: 12/18/18  2:32 PM   Specimen: Nasal Mucosa; Nasopharyngeal  Result Value Ref Range Status   MRSA by PCR NEGATIVE NEGATIVE Final    Comment:        The GeneXpert MRSA Assay (FDA approved for NASAL specimens only), is  one component of a comprehensive MRSA colonization surveillance program. It is not intended to diagnose MRSA infection nor to guide or monitor treatment for MRSA infections. Performed at [facility], [address] [place], [place] [address]     Coagulation Studies: Recent Labs    12/18/18 0648  LABPROT 13.8  INR 1.1    Urinalysis: No results for input(s): COLORURINE, LABSPEC, PHURINE, GLUCOSEU, HGBUR, BILIRUBINUR, KETONESUR, PROTEINUR, UROBILINOGEN, NITRITE, LEUKOCYTESUR in the last 72 hours.  Invalid input(s): APPERANCEUR    Imaging: Ct Abdomen Pelvis Wo Contrast  Result Date: 12/19/2018 CLINICAL DATA:  43 year old male with history of unexplained anemia. Hematoma in the right groin. Evaluate for retroperitoneal hemorrhage. EXAM: CT ABDOMEN AND PELVIS WITHOUT CONTRAST TECHNIQUE: Multidetector CT imaging of the abdomen and pelvis was performed following the standard protocol without IV contrast. COMPARISON:  No priors. FINDINGS: Lower chest: Trace bilateral pleural effusions lying dependently. Hepatobiliary: No suspicious cystic or solid hepatic lesions are confidently identified on today's noncontrast CT examination. Unenhanced appearance of the gallbladder is normal. Pancreas: No definite pancreatic mass or peripancreatic fluid collections or inflammatory changes are noted on today's noncontrast CT  examination. Spleen: Unremarkable. Adrenals/Urinary Tract: Severe renal atrophy bilaterally. Multiple low-attenuation lesions are noted in both kidneys measuring up to 2.2 cm in the interpolar region of the left kidney, incompletely characterized on today's noncontrast CT examination. Several of these demonstrates some coarse calcifications, most evident in the lower pole of the left kidney. No definite calculi are identified within the collecting system of either kidney, along the course of either ureter, or within the lumen of the urinary bladder. No hydroureteronephrosis. Urinary bladder is nearly completely decompressed, but otherwise unremarkable in appearance. Bilateral adrenal glands are normal in appearance. Stomach/Bowel: Normal appearance of the stomach. No pathologic dilatation of small bowel or colon. Normal appendix. Vascular/Lymphatic: No atherosclerotic calcifications in the abdominal aorta or the pelvic vasculature. Left femoral PermCath with tip terminating in the inferior vena cava adjacent to the level of the renal veins. No lymphadenopathy identified in the abdomen or pelvis. Reproductive: Prostate gland and seminal vesicles are unremarkable in appearance. Other: No high attenuation fluid collection in the peritoneal cavity or retroperitoneum to suggest intraperitoneal or retroperitoneal hemorrhage. No significant volume of ascites. No pneumoperitoneum. Musculoskeletal: There is a large amount of high attenuation fluid in the visualized portions of the upper right thigh, predominantly in the quadriceps and adductor musculature, compatible with a large amount of intramuscular hemorrhage. There are no aggressive appearing lytic or blastic lesions noted in the visualized portions of the skeleton. IMPRESSION: 1. Large intramuscular hemorrhage in the upper right thigh, predominantly in the quadriceps and adductor musculature. 2. No intraperitoneal or retroperitoneal hemorrhage. 3. Severe renal atrophy  bilaterally with multiple small predominantly low-attenuation lesions in the kidneys bilaterally, incompletely characterized on today's noncontrast CT examination. Several of these are partially calcified. These are likely to represent cysts associated with chronic renal failure and history of dialysis. If further characterization is desired, nonemergent outpatient MRI of the abdomen with and without IV gadolinium could be performed in the future. 4. Trace bilateral pleural effusions. Electronically Signed   By: Vinnie Langton M.D.   On: 12/19/2018 09:17     Medications:   . dextrose 40 mL/hr at 12/19/18 2207   . Chlorhexidine Gluconate Cloth  6 each Topical Q0600  . docusate sodium  100 mg Oral BID  . influenza vac split quadrivalent PF  0.5 mL Intramuscular Tomorrow-1000  . pantoprazole (PROTONIX) IV  40 mg  Intravenous Q24H   acetaminophen **OR** acetaminophen, calcium carbonate, heparin, HYDROcodone-acetaminophen, ondansetron **OR** ondansetron (ZOFRAN) IV  Assessment/ Plan:  Ralph Lucero is a 43 y.o. Hispanic Spanish Speaking only male with end stage renal disease on hemodialysis followed by [facility], hypertension, coronary artery disease, secondary hyperparathyroidism status post parathyroidectomy who presents to [facility] with hyperkalemia and pulmonary edema.   [facility] TTS [place] Left AVF  1. End Stage Renal Disease with hyperkalemia: complication with dialysis device.  Extra dialysis treatment yesterday due to hyperkalemia.  - Appreciate vascular input.  - Continue TTS schedule   2. Hypertension: history of difficult to control. Hypertensive urgency on admission.  Home regimen of metoprolol, lisinopril, isosorbide mononitrate and felodipine.  Currently holding home bp meds.   3. Anemia of chronic kidney disease: with blood loss Secondary to hematoma. Status post PRBC transfusion on 9/9 and second transfusion today 9/10 - EPO with HD treatment.    4. Secondary Hyperparathyroidism status post parathyroidectomy  - calcium acetate with meals.     LOS: 2 Kelle Ruppert 9/10/202012:55 PM

## 2018-12-20 NOTE — Progress Notes (Signed)
HD in process, alert and oriented. C/o minimum nausea pretreatment, medicated by primary RN, goal to remove 1.5L, 3.5 hour treatment, planning to receive 1unit of prbc during dialysis, 2K2.5ca bath used will cont to monitor whille on treatment

## 2018-12-20 NOTE — Progress Notes (Signed)
Initial Nutrition Assessment  DOCUMENTATION CODES:   Underweight  INTERVENTION:  Provide Nepro Shake po BID, each supplement provides 425 kcal and 19 grams protein.  Provide Rena-vite QHS.  NUTRITION DIAGNOSIS:   Increased nutrient needs related to catabolic illness(ESRD on HD) as evidenced by estimated needs.  GOAL:   Patient will meet greater than or equal to 90% of their needs  MONITOR:   PO intake, Supplement acceptance, Labs, Weight trends, I & O's  REASON FOR ASSESSMENT:   Other (Comment)(Low BMI)    ASSESSMENT:   43 year old male with PMHx of HTN, ESRD on HD admitted with acute hypoxic respiratory failure, hyperkalemia, thrombosis of AV access, acute on chronic anemia.   Spoke with patient at bedside with assistance from [employer] video interpreter service [phone]). Patient reports he has a decreased appetite and he is not able to eat well. He is eating 0-10% of meals per chart. He reports he typically eats a little better at home. He typically has some type of meat or fish at meals and then sides. He is amenable to trying Nepro to help meet calorie/protein needs. He reports that he takes a renal multivitamin each evening at home.  Patient reports that his UBW is 57 kg and that he had lost down to 54 kg PTA. No weight history in chart and cannot find a documented EDW. Patient is currently 60.5 kg (133.38 lbs).   Medications reviewed and include: Colace 100 mg BID, pantoprazole, D10 at 40 mL/hr.  Labs reviewed: CBG 120-124, BUN 34, Creatinine 9.07.   Patient is at risk for malnutrition but does not meet criteria at this time.  NUTRITION - FOCUSED PHYSICAL EXAM:    Most Recent Value  Orbital Region  No depletion  Upper Arm Region  Mild depletion  Thoracic and Lumbar Region  No depletion  Buccal Region  No depletion  Temple Region  No depletion  Clavicle Bone Region  Mild depletion  Clavicle and Acromion Bone Region  No depletion  Scapular Bone Region  No  depletion  Dorsal Hand  No depletion  Patellar Region  Mild depletion  Anterior Thigh Region  Mild depletion  Posterior Calf Region  Mild depletion  Edema (RD Assessment)  Mild  Hair  Reviewed  Eyes  Reviewed  Mouth  Reviewed  Skin  Reviewed  Nails  Reviewed     Diet Order:   Diet Order            Diet renal with fluid restriction Fluid restriction: 1200 mL Fluid; Room service appropriate? Yes; Fluid consistency: Thin  Diet effective now             EDUCATION NEEDS:   No education needs have been identified at this time  Skin:  Skin Assessment: Reviewed RN Assessment  Last BM:  12/18/2018 per chart  Height:   Ht Readings from Last 1 Encounters:  12/17/18 6' (1.829 m)   Weight:   Wt Readings from Last 1 Encounters:  12/19/18 60.5 kg   Ideal Body Weight:  80.9 kg  BMI:  Body mass index is 18.09 kg/m.  Estimated Nutritional Needs:   Kcal:  2000-2200  Protein:  90-100 grams  Fluid:  UOP + 1 L  Willey Blade, MS, RD, LDN Office: [phone] Pager: [phone] After Hours/Weekend Pager: [phone]

## 2018-12-21 ENCOUNTER — Inpatient Hospital Stay: Payer: Medicaid Other | Admitting: Anesthesiology

## 2018-12-21 ENCOUNTER — Encounter: Payer: Self-pay | Admitting: Anesthesiology

## 2018-12-21 ENCOUNTER — Encounter: Payer: Self-pay | Admitting: Internal Medicine

## 2018-12-21 ENCOUNTER — Inpatient Hospital Stay: Payer: Medicaid Other

## 2018-12-21 ENCOUNTER — Encounter
Admission: EM | Disposition: A | Discharge status: Home / Self Care | Payer: Self-pay | Source: Home / Self Care | Attending: Internal Medicine

## 2018-12-21 DIAGNOSIS — T82898A Other specified complication of vascular prosthetic devices, implants and grafts, initial encounter: Secondary | ICD-10-CM

## 2018-12-21 DIAGNOSIS — I724 Aneurysm of artery of lower extremity: Secondary | ICD-10-CM

## 2018-12-21 HISTORY — PX: HEMATOMA EVACUATION: SHX5118

## 2018-12-21 LAB — CBC WITH DIFFERENTIAL/PLATELET
Abs Immature Granulocytes: 0.03 10*3/uL (ref 0.00–0.07)
Basophils Absolute: 0 10*3/uL (ref 0.0–0.1)
Basophils Relative: 0 %
Eosinophils Absolute: 0.3 10*3/uL (ref 0.0–0.5)
Eosinophils Relative: 5 %
HCT: 24.7 % — ABNORMAL LOW (ref 39.0–52.0)
Hemoglobin: 8.1 g/dL — ABNORMAL LOW (ref 13.0–17.0)
Immature Granulocytes: 0 %
Lymphocytes Relative: 12 %
Lymphs Abs: 0.8 10*3/uL (ref 0.7–4.0)
MCH: 31.6 pg (ref 26.0–34.0)
MCHC: 32.8 g/dL (ref 30.0–36.0)
MCV: 96.5 fL (ref 80.0–100.0)
Monocytes Absolute: 0.7 10*3/uL (ref 0.1–1.0)
Monocytes Relative: 10 %
Neutro Abs: 5.2 10*3/uL (ref 1.7–7.7)
Neutrophils Relative %: 73 %
Platelets: 69 10*3/uL — ABNORMAL LOW (ref 150–400)
RBC: 2.56 MIL/uL — ABNORMAL LOW (ref 4.22–5.81)
RDW: 14.7 % (ref 11.5–15.5)
WBC: 7.1 10*3/uL (ref 4.0–10.5)
nRBC: 0.3 % — ABNORMAL HIGH (ref 0.0–0.2)

## 2018-12-21 LAB — GLUCOSE, CAPILLARY
Glucose-Capillary: 103 mg/dL — ABNORMAL HIGH (ref 70–99)
Glucose-Capillary: 115 mg/dL — ABNORMAL HIGH (ref 70–99)
Glucose-Capillary: 116 mg/dL — ABNORMAL HIGH (ref 70–99)
Glucose-Capillary: 118 mg/dL — ABNORMAL HIGH (ref 70–99)
Glucose-Capillary: 138 mg/dL — ABNORMAL HIGH (ref 70–99)
Glucose-Capillary: 144 mg/dL — ABNORMAL HIGH (ref 70–99)
Glucose-Capillary: 144 mg/dL — ABNORMAL HIGH (ref 70–99)
Glucose-Capillary: 155 mg/dL — ABNORMAL HIGH (ref 70–99)
Glucose-Capillary: 55 mg/dL — ABNORMAL LOW (ref 70–99)
Glucose-Capillary: 81 mg/dL (ref 70–99)
Glucose-Capillary: >600 mg/dL (ref 70–99)

## 2018-12-21 LAB — CBC
HCT: 16.4 % — ABNORMAL LOW (ref 39.0–52.0)
Hemoglobin: 5.3 g/dL — ABNORMAL LOW (ref 13.0–17.0)
MCH: 31.5 pg (ref 26.0–34.0)
MCHC: 32.3 g/dL (ref 30.0–36.0)
MCV: 97.6 fL (ref 80.0–100.0)
Platelets: 83 10*3/uL — ABNORMAL LOW (ref 150–400)
RBC: 1.68 MIL/uL — ABNORMAL LOW (ref 4.22–5.81)
RDW: 14.8 % (ref 11.5–15.5)
WBC: 8.3 10*3/uL (ref 4.0–10.5)
nRBC: 0.2 % (ref 0.0–0.2)

## 2018-12-21 LAB — BASIC METABOLIC PANEL
Anion gap: 11 (ref 5–15)
BUN: 25 mg/dL — ABNORMAL HIGH (ref 6–20)
CO2: 27 mmol/L (ref 22–32)
Calcium: 7.7 mg/dL — ABNORMAL LOW (ref 8.9–10.3)
Chloride: 100 mmol/L (ref 98–111)
Creatinine, Ser: 6.86 mg/dL — ABNORMAL HIGH (ref 0.61–1.24)
GFR calc Af Amer: 10 mL/min — ABNORMAL LOW (ref >60)
GFR calc non Af Amer: 9 mL/min — ABNORMAL LOW (ref >60)
Glucose, Bld: 141 mg/dL — ABNORMAL HIGH (ref 70–99)
Potassium: 4.4 mmol/L (ref 3.5–5.1)
Sodium: 138 mmol/L (ref 135–145)

## 2018-12-21 LAB — HEMOGLOBIN AND HEMATOCRIT, BLOOD
HCT: 25.6 % — ABNORMAL LOW (ref 39.0–52.0)
Hemoglobin: 8.4 g/dL — ABNORMAL LOW (ref 13.0–17.0)

## 2018-12-21 LAB — GLUCOSE, RANDOM: Glucose, Bld: 117 mg/dL — ABNORMAL HIGH (ref 70–99)

## 2018-12-21 LAB — PREPARE RBC (CROSSMATCH)

## 2018-12-21 SURGERY — EVACUATION HEMATOMA
Anesthesia: General | Site: Groin | Laterality: Right

## 2018-12-21 MED ORDER — DEXAMETHASONE SODIUM PHOSPHATE 10 MG/ML IJ SOLN
INTRAMUSCULAR | Status: DC | PRN
  Administered 2018-12-21: 10 mg via INTRAVENOUS

## 2018-12-21 MED ORDER — LACTATED RINGERS IV SOLN
INTRAVENOUS | Status: DC | PRN
  Administered 2018-12-21: 16:00:00 via INTRAVENOUS

## 2018-12-21 MED ORDER — LORAZEPAM 2 MG/ML IJ SOLN
2.0000 mg | INTRAMUSCULAR | Status: DC | PRN
  Administered 2018-12-21: 12:00:00 2 mg via INTRAVENOUS
  Filled 2018-12-21: qty 1

## 2018-12-21 MED ORDER — MORPHINE SULFATE (PF) 2 MG/ML IV SOLN
1.0000 mg | INTRAVENOUS | Status: DC | PRN
  Administered 2018-12-21: 1 mg via INTRAVENOUS
  Filled 2018-12-21: qty 1

## 2018-12-21 MED ORDER — SODIUM CHLORIDE 0.9 % IV SOLN
INTRAVENOUS | Status: DC
  Administered 2018-12-22: 01:00:00 via INTRAVENOUS

## 2018-12-21 MED ORDER — FENTANYL CITRATE (PF) 100 MCG/2ML IJ SOLN
INTRAMUSCULAR | Status: AC
  Filled 2018-12-21: qty 2

## 2018-12-21 MED ORDER — SODIUM CHLORIDE 0.9% IV SOLUTION: Freq: Once | INTRAVENOUS | Status: DC

## 2018-12-21 MED ORDER — ONDANSETRON HCL 4 MG/2ML IJ SOLN
INTRAMUSCULAR | Status: AC
  Filled 2018-12-21: qty 2

## 2018-12-21 MED ORDER — OXYCODONE HCL 5 MG PO TABS: 5.0000 mg | ORAL_TABLET | Freq: Once | ORAL | Status: DC | PRN

## 2018-12-21 MED ORDER — DEXTROSE 10 % IV SOLN
INTRAVENOUS | Status: DC
  Administered 2018-12-21: 12:00:00 via INTRAVENOUS

## 2018-12-21 MED ORDER — SUCCINYLCHOLINE CHLORIDE 20 MG/ML IJ SOLN
INTRAMUSCULAR | Status: DC | PRN
  Administered 2018-12-21: 120 mg via INTRAVENOUS

## 2018-12-21 MED ORDER — EVICEL 5 ML EX KIT
PACK | CUTANEOUS | Status: DC | PRN
  Administered 2018-12-21: 5 mL

## 2018-12-21 MED ORDER — OXYCODONE HCL 5 MG/5ML PO SOLN: 5.0000 mg | Freq: Once | ORAL | Status: DC | PRN

## 2018-12-21 MED ORDER — HYDROCODONE-ACETAMINOPHEN 5-325 MG PO TABS
1.0000 | ORAL_TABLET | ORAL | Status: DC | PRN
  Administered 2018-12-24: 1 via ORAL
  Administered 2018-12-25 (×4): 2 via ORAL
  Filled 2018-12-21: qty 1
  Filled 2018-12-21 (×5): qty 2

## 2018-12-21 MED ORDER — PROPOFOL 10 MG/ML IV BOLUS
INTRAVENOUS | Status: AC
  Filled 2018-12-21: qty 20

## 2018-12-21 MED ORDER — PROPOFOL 10 MG/ML IV BOLUS
INTRAVENOUS | Status: DC | PRN
  Administered 2018-12-21 (×2): 50 mg via INTRAVENOUS
  Administered 2018-12-21: 100 mg via INTRAVENOUS

## 2018-12-21 MED ORDER — FENTANYL CITRATE (PF) 100 MCG/2ML IJ SOLN
INTRAMUSCULAR | Status: DC | PRN
  Administered 2018-12-21 (×2): 50 ug via INTRAVENOUS

## 2018-12-21 MED ORDER — LORAZEPAM 2 MG/ML IJ SOLN: 2.0000 mg | INTRAMUSCULAR | Status: DC

## 2018-12-21 MED ORDER — ROCURONIUM BROMIDE 50 MG/5ML IV SOLN
INTRAVENOUS | Status: AC
  Filled 2018-12-21: qty 1

## 2018-12-21 MED ORDER — LIDOCAINE HCL (PF) 2 % IJ SOLN
INTRAMUSCULAR | Status: AC
  Filled 2018-12-21: qty 10

## 2018-12-21 MED ORDER — DEXTROSE 50 % IV SOLN
12.5000 g | INTRAVENOUS | Status: AC
  Administered 2018-12-21: 12.5 g via INTRAVENOUS
  Filled 2018-12-21: qty 50

## 2018-12-21 MED ORDER — BACITRACIN ZINC 500 UNIT/GM EX OINT
TOPICAL_OINTMENT | CUTANEOUS | Status: AC
  Filled 2018-12-21: qty 28.35

## 2018-12-21 MED ORDER — DEXTROSE 10 % IV SOLN: INTRAVENOUS | Status: DC

## 2018-12-21 MED ORDER — IOHEXOL 350 MG/ML SOLN
100.0000 mL | Freq: Once | INTRAVENOUS | Status: AC | PRN
  Administered 2018-12-21: 100 mL via INTRAVENOUS

## 2018-12-21 MED ORDER — MIDAZOLAM HCL 2 MG/2ML IJ SOLN
INTRAMUSCULAR | Status: AC
  Filled 2018-12-21: qty 2

## 2018-12-21 MED ORDER — LIDOCAINE HCL (CARDIAC) PF 100 MG/5ML IV SOSY
PREFILLED_SYRINGE | INTRAVENOUS | Status: DC | PRN
  Administered 2018-12-21: 60 mg via INTRAVENOUS

## 2018-12-21 MED ORDER — CEFAZOLIN SODIUM-DEXTROSE 1-4 GM/50ML-% IV SOLN
1.0000 g | INTRAVENOUS | Status: AC
  Administered 2018-12-21: 1 g via INTRAVENOUS
  Filled 2018-12-21: qty 50

## 2018-12-21 MED ORDER — ONDANSETRON HCL 4 MG/2ML IJ SOLN
INTRAMUSCULAR | Status: DC | PRN
  Administered 2018-12-21: 4 mg via INTRAVENOUS

## 2018-12-21 MED ORDER — DEXMEDETOMIDINE HCL IN NACL 200 MCG/50ML IV SOLN
INTRAVENOUS | Status: DC | PRN
  Administered 2018-12-21: 12 ug via INTRAVENOUS
  Administered 2018-12-21: 8 ug via INTRAVENOUS

## 2018-12-21 MED ORDER — SODIUM CHLORIDE 0.9 % IV SOLN
INTRAVENOUS | Status: DC | PRN
  Administered 2018-12-21: 50 ug/min via INTRAVENOUS

## 2018-12-21 MED ORDER — MIDAZOLAM HCL 2 MG/2ML IJ SOLN
INTRAMUSCULAR | Status: DC | PRN
  Administered 2018-12-21 (×2): 1 mg via INTRAVENOUS

## 2018-12-21 MED ORDER — FENTANYL CITRATE (PF) 100 MCG/2ML IJ SOLN: 25.0000 ug | INTRAMUSCULAR | Status: DC | PRN

## 2018-12-21 MED ORDER — MORPHINE SULFATE (PF) 2 MG/ML IV SOLN
2.0000 mg | Freq: Once | INTRAVENOUS | Status: AC
  Administered 2018-12-21: 11:00:00 2 mg via INTRAVENOUS
  Filled 2018-12-21: qty 1

## 2018-12-21 MED ORDER — DEXAMETHASONE SODIUM PHOSPHATE 10 MG/ML IJ SOLN
INTRAMUSCULAR | Status: AC
  Filled 2018-12-21: qty 1

## 2018-12-21 MED ORDER — PHENYLEPHRINE HCL (PRESSORS) 10 MG/ML IV SOLN
INTRAVENOUS | Status: DC | PRN
  Administered 2018-12-21: 200 ug via INTRAVENOUS
  Administered 2018-12-21: 100 ug via INTRAVENOUS
  Administered 2018-12-21 (×7): 200 ug via INTRAVENOUS

## 2018-12-21 SURGICAL SUPPLY — 63 items
BAG DECANTER FOR FLEXI CONT (MISCELLANEOUS) ×2 IMPLANT
BLADE SURG SZ11 CARB STEEL (BLADE) ×2 IMPLANT
BOOT SUTURE AID YELLOW STND (SUTURE) ×2 IMPLANT
BRUSH SCRUB EZ  4% CHG (MISCELLANEOUS) ×1
BRUSH SCRUB EZ 4% CHG (MISCELLANEOUS) ×1 IMPLANT
CANISTER SUCT 1200ML W/VALVE (MISCELLANEOUS) ×2 IMPLANT
CHLORAPREP W/TINT 26 (MISCELLANEOUS) ×2 IMPLANT
COVER WAND RF STERILE (DRAPES) IMPLANT
DERMABOND ADVANCED (GAUZE/BANDAGES/DRESSINGS) ×1
DERMABOND ADVANCED .7 DNX12 (GAUZE/BANDAGES/DRESSINGS) ×1 IMPLANT
DRAIN CHANNEL JP 19F (MISCELLANEOUS) ×2 IMPLANT
DRAPE 3/4 80X56 (DRAPES) IMPLANT
DRAPE INCISE IOBAN 66X45 STRL (DRAPES) ×2 IMPLANT
DRESSING SURGICEL FIBRLLR 1X2 (HEMOSTASIS) ×1 IMPLANT
DRSG EMULSION OIL 3X8 NADH (GAUZE/BANDAGES/DRESSINGS) ×2 IMPLANT
DRSG OPSITE POSTOP 4X6 (GAUZE/BANDAGES/DRESSINGS) IMPLANT
DRSG SURGICEL FIBRILLAR 1X2 (HEMOSTASIS) ×2
ELECT CAUTERY BLADE 6.4 (BLADE) ×2 IMPLANT
ELECT REM PT RETURN 9FT ADLT (ELECTROSURGICAL) ×2
ELECTRODE REM PT RTRN 9FT ADLT (ELECTROSURGICAL) ×1 IMPLANT
GLOVE SURG SYN 8.0 (GLOVE) ×2 IMPLANT
GOWN STRL REUS W/ TWL LRG LVL3 (GOWN DISPOSABLE) ×1 IMPLANT
GOWN STRL REUS W/ TWL XL LVL3 (GOWN DISPOSABLE) ×1 IMPLANT
GOWN STRL REUS W/TWL LRG LVL3 (GOWN DISPOSABLE) ×1
GOWN STRL REUS W/TWL XL LVL3 (GOWN DISPOSABLE) ×1
IV NS 500ML (IV SOLUTION) ×1
IV NS 500ML BAXH (IV SOLUTION) ×1 IMPLANT
KIT TURNOVER KIT A (KITS) ×2 IMPLANT
LABEL OR SOLS (LABEL) ×2 IMPLANT
LOOP RED MAXI  1X406MM (MISCELLANEOUS) ×2
LOOP VESSEL MAXI 1X406 RED (MISCELLANEOUS) ×2 IMPLANT
LOOP VESSEL MINI 0.8X406 BLUE (MISCELLANEOUS) ×2 IMPLANT
LOOPS BLUE MINI 0.8X406MM (MISCELLANEOUS) ×2
NEEDLE HYPO 18GX1.5 BLUNT FILL (NEEDLE) ×2 IMPLANT
NS IRRIG 1000ML POUR BTL (IV SOLUTION) ×2 IMPLANT
PACK BASIN MAJOR ARMC (MISCELLANEOUS) ×2 IMPLANT
PACK UNIVERSAL (MISCELLANEOUS) ×2 IMPLANT
PAD ABD DERMACEA PRESS 5X9 (GAUZE/BANDAGES/DRESSINGS) ×4 IMPLANT
STAPLER SKIN PROX 35W (STAPLE) ×2 IMPLANT
SUCT RESERVOIR 100CC (MISCELLANEOUS) ×2 IMPLANT
SUT ETHILON 3-0 FS-10 30 BLK (SUTURE) ×2
SUT MNCRL+ 5-0 UNDYED PC-3 (SUTURE) IMPLANT
SUT MONOCRYL 5-0 (SUTURE)
SUT PROLENE 5 0 RB 1 DA (SUTURE) ×2 IMPLANT
SUT PROLENE 5-0 (SUTURE) ×2
SUT PROLENE 5-0 BB 36X2 ARM (SUTURE) ×2
SUT PROLENE 6 0 BV (SUTURE) ×12 IMPLANT
SUT SILK 2 0 (SUTURE) ×1
SUT SILK 2-0 18XBRD TIE 12 (SUTURE) ×1 IMPLANT
SUT SILK 3 0 (SUTURE) ×1
SUT SILK 3-0 18XBRD TIE 12 (SUTURE) ×1 IMPLANT
SUT SILK 4 0 (SUTURE) ×1
SUT SILK 4-0 18XBRD TIE 12 (SUTURE) ×1 IMPLANT
SUT VIC AB 2-0 CT1 27 (SUTURE) ×2
SUT VIC AB 2-0 CT1 TAPERPNT 27 (SUTURE) ×2 IMPLANT
SUT VIC AB 3-0 SH 27 (SUTURE) ×1
SUT VIC AB 3-0 SH 27X BRD (SUTURE) ×1 IMPLANT
SUT VICRYL+ 3-0 36IN CT-1 (SUTURE) ×4 IMPLANT
SUTURE EHLN 3-0 FS-10 30 BLK (SUTURE) ×1 IMPLANT
SUTURE PROLEN 5-0 BB 36X2 ARM (SUTURE) ×2 IMPLANT
SYR 10ML LL (SYRINGE) ×2 IMPLANT
SYR 20ML LL LF (SYRINGE) ×2 IMPLANT
SYR 3ML LL SCALE MARK (SYRINGE) ×2 IMPLANT

## 2018-12-21 NOTE — Progress Notes (Signed)
Hypoglycemic Event  CBG: 55  Treatment: D50 25 mL (12.5 gm)  Symptoms: None  Follow-up CBG: Time:1025 CBG Result:115  Possible Reasons for Event: Inadequate meal intake  Comments/MD notified yes    Francesco Sor

## 2018-12-21 NOTE — Anesthesia Procedure Notes (Signed)
Procedure Name: Intubation Date/Time: 12/21/2018 4:33 PM Performed by: Lavone Orn, CRNA Pre-anesthesia Checklist: Patient identified, Emergency Drugs available, Suction available, Patient being monitored and Timeout performed Patient Re-evaluated:Patient Re-evaluated prior to induction Oxygen Delivery Method: Circle system utilized Preoxygenation: Pre-oxygenation with 100% oxygen Induction Type: IV induction, Rapid sequence and Cricoid Pressure applied Laryngoscope Size: Mac and 3 Grade View: Grade I Tube type: Oral Number of attempts: 1 Airway Equipment and Method: Stylet Placement Confirmation: ETT inserted through vocal cords under direct vision,  positive ETCO2 and breath sounds checked- equal and bilateral Secured at: 23 cm Tube secured with: Tape Dental Injury: Teeth and Oropharynx as per pre-operative assessment

## 2018-12-21 NOTE — Transfer of Care (Signed)
Immediate Anesthesia Transfer of Care Note  Patient: Raymundo Pinales  Procedure(s) Performed: Procedure(s): EVACUATION RIGHT THIGH HEMATOMA WITH REPAIR OF RIGHT SFA PSEUDOANERYSM (Right)  Patient Location: PACU  Anesthesia Type:General  Level of Consciousness: sedated  Airway & Oxygen Therapy: Patient Spontanous Breathing and Patient connected to face mask oxygen  Post-op Assessment: Report given to RN and Post -op Vital signs reviewed and stable  Post vital signs: Reviewed and stable  Last Vitals:  Vitals:   12/21/18 1321 12/21/18 1807  BP: 112/76 (!) 83/51  Pulse: 93 66  Resp: 20 15  Temp: 36.9 C 36.6 C  SpO2: [id number]    Complications: No apparent anesthesia complications

## 2018-12-21 NOTE — Anesthesia Preprocedure Evaluation (Signed)
Anesthesia Evaluation  Patient identified by MRN, date of birth, ID band Patient awake    Reviewed: Allergy & Precautions, H&P , NPO status , Patient's Chart, lab work & pertinent test results  Airway Mallampati: II  TM Distance: >3 FB Neck ROM: full    Dental  (+) Chipped, Poor Dentition   Pulmonary neg shortness of breath,           Cardiovascular Exercise Tolerance: Good hypertension, + CAD, + Past MI and + Cardiac Stents       Neuro/Psych negative neurological ROS  negative psych ROS   GI/Hepatic negative GI ROS, Neg liver ROS,   Endo/Other  negative endocrine ROS  Renal/GU DialysisRenal disease     Musculoskeletal   Abdominal   Peds  Hematology negative hematology ROS (+)   Anesthesia Other Findings Past Medical History: No date: ESRD (end stage renal disease) on dialysis (HCC) No date: Hypertension No date: Renal disorder  Past Surgical History: 12/18/2018: TEMPORARY DIALYSIS CATHETER; Left     Comment:  Procedure: TEMPORARY DIALYSIS CATHETER;  Surgeon:               Katha Cabal, MD;  Location: [facility];               Service: Cardiovascular;  Laterality: Left;  BMI    Body Mass Index: 18.09 kg/m      Reproductive/Obstetrics negative OB ROS                             Anesthesia Physical Anesthesia Plan  ASA: V and emergent  Anesthesia Plan: General ETT, Rapid Sequence and Cricoid Pressure   Post-op Pain Management:    Induction: Intravenous  PONV Risk Score and Plan: Ondansetron, Dexamethasone, Midazolam and Treatment may vary due to age or medical condition  Airway Management Planned: Oral ETT  Additional Equipment:   Intra-op Plan:   Post-operative Plan: Extubation in OR and Possible Post-op intubation/ventilation  Informed Consent: I have reviewed the patients History and Physical, chart, labs and discussed the procedure including the  risks, benefits and alternatives for the proposed anesthesia with the patient or authorized representative who has indicated his/her understanding and acceptance.     Dental Advisory Given  Plan Discussed with: Anesthesiologist, CRNA and Surgeon  Anesthesia Plan Comments: (Patient consented for risks of anesthesia including but not limited to:  - adverse reactions to medications - damage to teeth, lips or other oral mucosa - sore throat or hoarseness - Damage to heart, brain, lungs or loss of life  Patient voiced understanding.)        Anesthesia Quick Evaluation

## 2018-12-21 NOTE — Progress Notes (Signed)
[facility]  ROUNDING NOTE   Subjective:   History taken with assistance of Spanish video interpreter   Sons and wife are at bedside. Family is very concerned about patient's pain control.   Patient complains of right thigh hematoma pain.   Hemodialysis treatment yesterday. Tolerated treatment well. Through his temp femoral catheter.   Objective:  Vital signs in last 24 hours:  Temp:  [98.4 F (36.9 C)-101.4 F (38.6 C)] 98.4 F (36.9 C) (09/11 1321) Pulse Rate:  [81-115] 93 (09/11 1321) Resp:  [15-24] 20 (09/11 1321) BP: (101-120)/(64-93) 112/76 (09/11 1321) SpO2:  [94 %-100 %] 100 % (09/11 1321)  Weight change:  Filed Weights   12/18/18 1947 12/19/18 1300 12/19/18 1545  Weight: 60.3 kg 60.1 kg 60.5 kg    Intake/Output: I/O last 3 completed shifts: In: 2199.8 [P.O.:480; I.V.:1229.8; Blood:390; IV Piggyback:100] Out: 56 [Other:1900]   Intake/Output this shift:  Total I/O In: 120 [P.O.:120] Out: -   Physical Exam: General: Laying in bed  Head: Normocephalic, atraumatic. Moist oral mucosal membranes  Eyes: Anicteric, PERRL, +pterygium  Neck: Supple, trachea midline  Lungs:  clear  Heart: Regular rate and rhythm  Abdomen:  Soft, nontender,   Extremities: no peripheral edema. Right thigh hematoma - tender to touch  Neurologic: sedated  Skin: No lesions  Access: Left AVF + thrill, +bruit,  Left femoral temp HD catheter Dr. Delana Meyer 9/8    Basic Metabolic Panel: Recent Labs  Lab 12/17/18 2348 12/18/18 [id number] 12/19/18 0920 12/19/18 1433 12/20/18 0432 12/21/18 0616  NA 143  --  138  --  139 138  K 6.8* 6.0* 6.0* 3.4* 4.3 4.4  CL 100  --  97*  --  100 100  CO2 24  --  26  --  28 27  GLUCOSE 124*  --  112*  --  119* 141*  BUN 94*  --  59*  --  34* 25*  CREATININE 16.61*  --  13.03*  --  9.07* 6.86*  CALCIUM 8.2*  --  7.4*  --  7.5* 7.7*  MG 3.0*  --   --   --   --   --   PHOS  --   --  4.3  --  4.5  --     Liver Function Tests: Recent  Labs  Lab 12/17/18 2348 12/19/18 0920 12/20/18 0432  AST 23  --   --   ALT 30  --   --   ALKPHOS 111  --   --   BILITOT 0.8  --   --   PROT 8.0  --   --   ALBUMIN 4.5 3.4* 3.3*   No results for input(s): LIPASE, AMYLASE in the last 168 hours. No results for input(s): AMMONIA in the last 168 hours.  CBC: Recent Labs  Lab 12/17/18 2348 12/18/18 0648  12/20/18 0100 12/20/18 0432 12/20/18 1030 12/20/18 1737 12/20/18 2356 12/21/18 0616  WBC 9.6 5.5  --  7.1  --  5.9 5.6  --  7.1  NEUTROABS 7.7 4.2  --  5.1  --   --   --   --  5.2  HGB 11.1* 9.8*   < > 7.0* 6.6* 6.9* 8.0* 8.4* 8.1*  HCT 34.9* 30.4*   < > 21.2* 20.3* 21.9* 24.3* 25.6* 24.7*  MCV 97.5 96.8  --  95.5  --  97.3 95.3  --  96.5  PLT 140* 142*  --  90*  --  83* 70*  --  69*   < > = values in this interval not displayed.    Cardiac Enzymes: No results for input(s): CKTOTAL, CKMB, CKMBINDEX, TROPONINI in the last 168 hours.  BNP: Invalid input(s): POCBNP  CBG: Recent Labs  Lab 12/21/18 0605 12/21/18 0744 12/21/18 0948 12/21/18 1024 12/21/18 1159  GLUCAP 118* 103* 55* 115* 155*    Microbiology: Results for orders placed or performed during the hospital encounter of 12/17/18  SARS Coronavirus 2 [facility] order, Performed in [facility] hospital lab) Nasopharyngeal Nasopharyngeal Swab     Status: None   Collection Time: 12/18/18 12:31 AM   Specimen: Nasopharyngeal Swab  Result Value Ref Range Status   SARS Coronavirus 2 NEGATIVE NEGATIVE Final    Comment: (NOTE) If result is NEGATIVE SARS-CoV-2 target nucleic acids are NOT DETECTED. The SARS-CoV-2 RNA is generally detectable in upper and lower  respiratory specimens during the acute phase of infection. The lowest  concentration of SARS-CoV-2 viral copies this assay can detect is 250  copies / mL. A negative result does not preclude SARS-CoV-2 infection  and should not be used as the sole basis for treatment or other  patient management decisions.  A  negative result may occur with  improper specimen collection / handling, submission of specimen other  than nasopharyngeal swab, presence of viral mutation(s) within the  areas targeted by this assay, and inadequate number of viral copies  (<250 copies / mL). A negative result must be combined with clinical  observations, patient history, and epidemiological information. If result is POSITIVE SARS-CoV-2 target nucleic acids are DETECTED. The SARS-CoV-2 RNA is generally detectable in upper and lower  respiratory specimens dur ing the acute phase of infection.  Positive  results are indicative of active infection with SARS-CoV-2.  Clinical  correlation with patient history and other diagnostic information is  necessary to determine patient infection status.  Positive results do  not rule out bacterial infection or co-infection with other viruses. If result is PRESUMPTIVE POSTIVE SARS-CoV-2 nucleic acids MAY BE PRESENT.   A presumptive positive result was obtained on the submitted specimen  and confirmed on repeat testing.  While 2019 novel coronavirus  (SARS-CoV-2) nucleic acids may be present in the submitted sample  additional confirmatory testing may be necessary for epidemiological  and / or clinical management purposes  to differentiate between  SARS-CoV-2 and other Sarbecovirus currently known to infect humans.  If clinically indicated additional testing with an alternate test  methodology [phone]) is advised. The SARS-CoV-2 RNA is generally  detectable in upper and lower respiratory sp ecimens during the acute  phase of infection. The expected result is Negative. Fact Sheet for Patients:  StrictlyIdeas.no Fact Sheet for Healthcare Providers: BankingDealers.co.za This test is not yet approved or cleared by the [place] FDA and has been authorized for detection and/or diagnosis of SARS-CoV-2 by FDA under an Emergency Use  Authorization (EUA).  This EUA will remain in effect (meaning this test can be used) for the duration of the COVID-19 declaration under Section 564(b)(1) of the Act, 21 U.S.C. section 360bbb-3(b)(1), unless the authorization is terminated or revoked sooner. Performed at [facility], [address] [place], [place] [address]   MRSA PCR Screening     Status: None   Collection Time: 12/18/18  2:32 PM   Specimen: Nasal Mucosa; Nasopharyngeal  Result Value Ref Range Status   MRSA by PCR NEGATIVE NEGATIVE Final    Comment:        The GeneXpert MRSA Assay (FDA  approved for NASAL specimens only), is one component of a comprehensive MRSA colonization surveillance program. It is not intended to diagnose MRSA infection nor to guide or monitor treatment for MRSA infections. Performed at [facility], [address] [place], [place] [address]     Coagulation Studies: No results for input(s): LABPROT, INR in the last 72 hours.  Urinalysis: No results for input(s): COLORURINE, LABSPEC, PHURINE, GLUCOSEU, HGBUR, BILIRUBINUR, KETONESUR, PROTEINUR, UROBILINOGEN, NITRITE, LEUKOCYTESUR in the last 72 hours.  Invalid input(s): APPERANCEUR    Imaging: No results found.   Medications:   . dextrose 20 mL/hr at 12/21/18 1148   . Chlorhexidine Gluconate Cloth  6 each Topical Q0600  . docusate sodium  100 mg Oral BID  . [START ON 12/22/2018] epoetin (EPOGEN/PROCRIT) injection  10,000 Units Intravenous Q T,Th,Sa-HD  . feeding supplement (NEPRO CARB STEADY)  237 mL Oral BID BM  . influenza vac split quadrivalent PF  0.5 mL Intramuscular Tomorrow-1000  . multivitamin  1 tablet Oral QHS  . pantoprazole  40 mg Oral QHS   acetaminophen **OR** acetaminophen, calcium carbonate, heparin, HYDROcodone-acetaminophen, LORazepam, morphine injection, ondansetron **OR** ondansetron (ZOFRAN) IV  Assessment/ Plan:  Mr. Ralph Lucero is a 43 y.o. Hispanic Spanish Speaking only  male with end stage renal disease on hemodialysis followed by [facility], hypertension, coronary artery disease, secondary hyperparathyroidism status post parathyroidectomy who presents to [facility] with hyperkalemia and pulmonary edema.   [facility] TTS [place] Left AVF  1. End Stage Renal Disease with hyperkalemia: complication with dialysis device.  Extra dialysis treatment yesterday due to hyperkalemia.  - Appreciate vascular input. Will need AVF functioning before discharge.  - Continue TTS schedule   2. Hypertension: history of difficult to control. Hypertensive urgency on admission.  Home regimen of metoprolol, lisinopril, isosorbide mononitrate and felodipine.  Currently holding home bp meds.   3. Anemia of chronic kidney disease: with blood loss Secondary to hematoma. Status post PRBC transfusion on 9/9 and second transfusion today 9/10 Hemoglobin stable this morning at 8.1 - EPO with HD treatment.   4. Secondary Hyperparathyroidism status post parathyroidectomy  - calcium acetate with meals.    5. Diabetes mellitus type II with chronic kidney disease - start dextrose infusion.   6. Right thigh hematoma - Discussed with family the concerns for compartment syndrome.  - Appreciate vascular input.    LOS: 3 Jameriah Trotti 9/11/20201:32 PM

## 2018-12-21 NOTE — Progress Notes (Signed)
RN notified MD pt has been screaming of pain all this morning after giving all his PRN pain meds. Per MD okay for RN to order one time order of 2 mg of morphine.

## 2018-12-21 NOTE — Progress Notes (Signed)
Per Dr. Juleen China RN okay to place order to start D10 at 39ml/hr because pt is stating he is not hungry at this time. Also okay to order 2 mg of ativan Q4 PRN for anxiety.

## 2018-12-21 NOTE — Anesthesia Post-op Follow-up Note (Signed)
Anesthesia QCDR form completed.        

## 2018-12-21 NOTE — Progress Notes (Signed)
Family had requested transfer to [facility] but patient needs to go to OR immediately as per my discussion with Dr. Delana Meyer. Will hold transfer at this time.

## 2018-12-21 NOTE — Progress Notes (Signed)
[place] at [place] NAME: Ralph Lucero    MR#:  [id number]  DATE OF BIRTH:  03-06-1976  SUBJECTIVE:  Worsening right thigh pain. Son at bedside REVIEW OF SYSTEMS:    Review of Systems  Constitutional: Negative for fever, chills weight loss HENT: Negative for ear pain, nosebleeds, congestion, facial swelling, rhinorrhea, neck pain, neck stiffness and ear discharge.   Respiratory: Negative for cough, shortness of breath, wheezing  Cardiovascular: Negative for chest pain, palpitations and leg swelling.  Gastrointestinal: Negative for heartburn, abdominal pain, vomiting, diarrhea or consitpation Genitourinary: Negative for dysuria, urgency, frequency, hematuria Musculoskeletal: Negative for back pain or joint pain Neurological: Negative for dizziness, seizures, syncope, focal weakness,  numbness and headaches.  Hematological: Does not bruise/bleed easily.  Psychiatric/Behavioral: Negative for hallucinations, confusion, dysphoric mood +HEMATOMA right thigh  Tolerating Diet: yes  DRUG ALLERGIES:  No Known Allergies  VITALS:  Blood pressure 112/76, pulse 93, temperature 98.4 F (36.9 C), temperature source Oral, resp. rate 20, height 6' (1.829 m), weight 60.5 kg, SpO2 100 %.  PHYSICAL EXAMINATION:  Constitutional: Appears well-developed and well-nourished. No distress. HENT: Normocephalic. Marland Kitchen Oropharynx is clear and moist.  Eyes: Conjunctivae and EOM are normal. PERRLA, no scleral icterus.  Neck: Normal ROM. Neck supple. No JVD. No tracheal deviation. CVS: RRR, S1/S2 +, no murmurs, no gallops, no carotid bruit.  Pulmonary: Effort and breath sounds normal, no stridor, rhonchi, wheezes, rales.  Abdominal: Soft. BS +,  no distension, tenderness, rebound or guarding.  Musculoskeletal: Normal range of motion. No edema and no tenderness.  Neuro: Alert. CN 2-12 grossly intact. No focal deficits. Skin: Skin is warm and dry. No rash  noted.right thigh hematoma, swollen Psychiatric: Normal mood and affect.   LABORATORY PANEL:   CBC Recent Labs  Lab 12/21/18 0616  WBC 7.1  HGB 8.1*  HCT 24.7*  PLT 69*   ------------------------------------------------------------------------------------------------------------------  Chemistries  Recent Labs  Lab 12/17/18 2348  12/21/18 0616  NA 143   < > 138  K 6.8*   < > 4.4  CL 100   < > 100  CO2 24   < > 27  GLUCOSE 124*   < > 141*  BUN 94*   < > 25*  CREATININE 16.61*   < > 6.86*  CALCIUM 8.2*   < > 7.7*  MG 3.0*  --   --   AST 23  --   --   ALT 30  --   --   ALKPHOS 111  --   --   BILITOT 0.8  --   --    < > = values in this interval not displayed.   ------------------------------------------------------------------------------------------------------------------  Cardiac Enzymes No results for input(s): TROPONINI in the last 168 hours. ------------------------------------------------------------------------------------------------------------------  RADIOLOGY:  No results found.   ASSESSMENT AND PLAN:   43 year old male with end-stage renal disease on hemodialysis and hypertension who presented to the emergency room due to shortness of breath after missing several dialysis sessions.  * Right thigh hematoma from attempted HD catheter placement Regarding compartment syndrome.  Requested vascular surgery evaluation.  Discussed with Dr. Delana Meyer.  He has ordered stat CT scan. Pain medications PRN Aspirin Plavix held  *  Acute hypoxic respiratory failure due to fluid overload from underlying end-stage renal disease. Wean Oxygen.  *  End-stage renal disease with hyperkalemia and complication of dialysis device with thrombosis of AV access. Status post angiography and placement of temporary dialysis catheter  *  CAD with elevated troponin due to poor renal clearance.  Patient ruled out for ACS.  Patient evaluated by cardiology. Will need to continue dual  antiplatelet therapy with aspirin and Brilinta following STEMI. May 2020 if hgb stable with hematoma.  *  Acute on chronic anemia S/p transfusion stable  D/w kolloru Management plans discussed with the patient and he is in agreement.  CODE STATUS: Full  TOTAL TIME TAKING CARE OF THIS PATIENT: 30 minutes.   POSSIBLE D/C 2-4 days, DEPENDING ON CLINICAL CONDITION.   Neita Carp M.D on 12/21/2018 at 2:01 PM  Between 7am to 6pm - Pager - [phone]  After 6pm go to [email or web address] - password [email or web address] [facility]  Office  [phone]  CC: Primary care physician; Anthonette Legato, MD  Note: This dictation was prepared with Dragon dictation along with smaller phrase technology. Any transcriptional errors that result from this process are unintentional.

## 2018-12-21 NOTE — Progress Notes (Signed)
[place] at [place] NAME: Ralph Lucero    MR#:  [id number]  DATE OF BIRTH:  1975-09-12  SUBJECTIVE:  Right thigh pain is well controlled REVIEW OF SYSTEMS:    Review of Systems  Constitutional: Negative for fever, chills weight loss HENT: Negative for ear pain, nosebleeds, congestion, facial swelling, rhinorrhea, neck pain, neck stiffness and ear discharge.   Respiratory: Negative for cough, shortness of breath, wheezing  Cardiovascular: Negative for chest pain, palpitations and leg swelling.  Gastrointestinal: Negative for heartburn, abdominal pain, vomiting, diarrhea or consitpation Genitourinary: Negative for dysuria, urgency, frequency, hematuria Musculoskeletal: Negative for back pain or joint pain Neurological: Negative for dizziness, seizures, syncope, focal weakness,  numbness and headaches.  Hematological: Does not bruise/bleed easily.  Psychiatric/Behavioral: Negative for hallucinations, confusion, dysphoric mood +HEMATOMA right thigh  Tolerating Diet: yes  DRUG ALLERGIES:  No Known Allergies  VITALS:  Blood pressure 112/76, pulse 93, temperature 98.4 F (36.9 C), temperature source Oral, resp. rate 20, height 6' (1.829 m), weight 60.5 kg, SpO2 100 %.  PHYSICAL EXAMINATION:  Constitutional: Appears well-developed and well-nourished. No distress. HENT: Normocephalic. Marland Kitchen Oropharynx is clear and moist.  Eyes: Conjunctivae and EOM are normal. PERRLA, no scleral icterus.  Neck: Normal ROM. Neck supple. No JVD. No tracheal deviation. CVS: RRR, S1/S2 +, no murmurs, no gallops, no carotid bruit.  Pulmonary: Effort and breath sounds normal, no stridor, rhonchi, wheezes, rales.  Abdominal: Soft. BS +,  no distension, tenderness, rebound or guarding.  Musculoskeletal: Normal range of motion. No edema and no tenderness.  Neuro: Alert. CN 2-12 grossly intact. No focal deficits. Skin: Skin is warm and dry. No rash noted.right  thigh hematoma Psychiatric: Normal mood and affect.   LABORATORY PANEL:   CBC Recent Labs  Lab 12/21/18 0616  WBC 7.1  HGB 8.1*  HCT 24.7*  PLT 69*   ------------------------------------------------------------------------------------------------------------------  Chemistries  Recent Labs  Lab 12/17/18 2348  12/21/18 0616  NA 143   < > 138  K 6.8*   < > 4.4  CL 100   < > 100  CO2 24   < > 27  GLUCOSE 124*   < > 141*  BUN 94*   < > 25*  CREATININE 16.61*   < > 6.86*  CALCIUM 8.2*   < > 7.7*  MG 3.0*  --   --   AST 23  --   --   ALT 30  --   --   ALKPHOS 111  --   --   BILITOT 0.8  --   --    < > = values in this interval not displayed.   ------------------------------------------------------------------------------------------------------------------  Cardiac Enzymes No results for input(s): TROPONINI in the last 168 hours. ------------------------------------------------------------------------------------------------------------------  RADIOLOGY:  No results found.   ASSESSMENT AND PLAN:   43 year old male with end-stage renal disease on hemodialysis and hypertension who presented to the emergency room due to shortness of breath after missing several dialysis sessions.  1.  Acute hypoxic respiratory failure due to fluid overload from underlying end-stage renal disease. Wean Oxygen.  2.  End-stage renal disease with hyperkalemia and complication of dialysis device with thrombosis of AV access. Status post angiography and placement of temporary dialysis catheter  3.  CAD with elevated troponin due to poor renal clearance.  Patient ruled out for ACS.  Patient evaluated by cardiology. Will need to continue dual antiplatelet therapy with aspirin and Brilinta following STEMI. May 2020 if hgb stable  with hematoma.  4.  Acute on chronic anemia S/p transfusion stable  5. Right thigh hematoma from attempted HD catheter placement  D/w kolloru Management plans  discussed with the patient and he is in agreement.  CODE STATUS: Full  TOTAL TIME TAKING CARE OF THIS PATIENT: 30 minutes.   POSSIBLE D/C 2-4 days, DEPENDING ON CLINICAL CONDITION.   Neita Carp M.D on 12/21/2018 at 1:47 PM  Between 7am to 6pm - Pager - [phone]  After 6pm go to [email or web address] - password [email or web address] [facility]  Office  [phone]  CC: Primary care physician; Anthonette Legato, MD  Note: This dictation was prepared with Dragon dictation along with smaller phrase technology. Any transcriptional errors that result from this process are unintentional.

## 2018-12-21 NOTE — Progress Notes (Signed)
Please contact [facility] at [phone] for any concerns or updated on 9/12 as the patient's son will be working and will not able to see the patient through part of the day.

## 2018-12-21 NOTE — Progress Notes (Signed)
Patient with worsening pain of left thigh.  Decreased sensations in left leg.  Swollen and tight.  Concern for compartment syndrome.  Discussed with Dr. Delana Meyer of vascular surgery who will see the patient.

## 2018-12-21 NOTE — Anesthesia Postprocedure Evaluation (Signed)
Anesthesia Post Note  Patient: Ralph Lucero  Procedure(s) Performed: EVACUATION RIGHT THIGH HEMATOMA WITH REPAIR OF RIGHT SFA PSEUDOANERYSM (Right Groin)  Patient location during evaluation: PACU Anesthesia Type: General Level of consciousness: awake and alert Pain management: pain level controlled Vital Signs Assessment: post-procedure vital signs reviewed and stable Respiratory status: spontaneous breathing, nonlabored ventilation, respiratory function stable and patient connected to nasal cannula oxygen Cardiovascular status: blood pressure returned to baseline and stable Postop Assessment: no apparent nausea or vomiting Anesthetic complications: no     Last Vitals:  Vitals:   12/21/18 1929 12/21/18 1939  BP: (!) 86/58 98/66  Pulse: 73 79  Resp: 14 14  Temp:  36.7 C  SpO2: 100% 100%    Last Pain:  Vitals:   12/21/18 1939  TempSrc:   PainSc: Asleep                 [name] [name]

## 2018-12-21 NOTE — Progress Notes (Signed)
   12/21/18 1400  Clinical Encounter Type  Visited With Family  Visit Type Initial  Referral From Family  Ch was paged by the family. Family wanted to call the pt's priest. The nurse explained to them the visitor rules that does not allow more than one family member in the room. The family showed understanding. Ch went in the room to see the pt and let them know that chaplains are available. The visit was appreciated.

## 2018-12-21 NOTE — Progress Notes (Signed)
Pt transferred from ICU bed 18.  Oriented pt to his surrounding and reviewed POC.  Pt was in 10/10 right leg pain.  Gave 2 Norco/ Vicodin per MD's orders.  Will continue to monitor and assess.

## 2018-12-21 NOTE — Op Note (Signed)
OPERATIVE NOTE   PROCEDURE: 1.  Repair right superficial femoral artery pseudoaneurysm 2.  Repair right femoral femoral traumatic AV fistula 3.  Evacuation right thigh hematoma 4.  Sartorius muscle flap coverage of the artery and vein   PRE-OPERATIVE DIAGNOSIS:  Right thigh pseudoaneurysm with massive hematoma and ischemic changes to the skin associated with a traumatic femoral to femoral arteriovenous fistula  POST-OPERATIVE DIAGNOSIS: Same  SURGEON: Ralph Lucero  ASSISTANT(S): Ralph Lucero  ANESTHESIA: general  ESTIMATED BLOOD LOSS: 200 cc  FINDING(S): Findings are consistent with the preoperative CT scan a large pseudoaneurysm with a massive hematoma is encountered there is a defect in the superficial femoral artery approximately two thirds of the circumference.  There is a traumatic AV fistula identified as well.  SPECIMEN(S): Blood clot from hematoma  INDICATIONS:   Ralph Lucero is a 43 y.o. male who presents with a large pseudoaneurysm and massive hematoma with ischemic skin changes noted.  Preoperative CT also identifies a traumatic AV fistula associated with this injury.  The risks and benefits for surgical repair were reviewed all questions been answered patient agrees to proceed..  DESCRIPTION: After full informed written consent was obtained from the patient, the patient was brought back to the operating room and placed supine upon the operating table.  Prior to induction, the patient received IV antibiotics.   After obtaining adequate anesthesia, the patient was then prepped and draped in the standard fashion for a exploration of the right thigh with repair of the pseudoaneurysm as well as repair of the AV fistula and evacuation of the hematoma.  A first assistant is required in order to allow for a safe and more efficient operation.  Duties include retraction of tissues to allow for optimal exposure, assisting with suture ligation of vessels as  well as maintaining a clear field of view with suction as needed.  Further duties include assisting with patient positioning during the surgery as well as wound closure.  I believe that this procedure requires a first assistant in order for it to be performed at a level in keeping with the high standards of this institution.  Vertical incision is made in the right thigh and carried down through the soft tissues.  The hematoma cavity is entered and a large amount of blood clot is evacuated digitally.  At this point significant pulsatile bleeding is identified.  Working with direct digital pressure controlling the arterial bleeding the superficial femoral artery was dissected circumferentially plaque proximally and distally.  Vascular clamps were then placed for control.  Having controlled the pseudoaneurysm attention is now turned to the significant venous bleeding.  The femoral vein is localized and using blunt dissection the injury to the vein is identified.  2 figure-of-eight 5-0 Prolene sutures were then used to close the venous defect.  Attention is then returned to the artery the injury is inspected it appears to represent a two thirds laceration of the superficial femoral artery.  Posterior wall is intact.  The wound is then debrided and closed using interrupted 6-0 Prolene sutures.  A total of 5 sutures were needed.  Flushing maneuvers were performed and flow was reestablished to the distal right lower extremity.  The hematoma cavity is now more formally addressed and significant thrombus is removed with both digital and irrigation methods.  The wound is then irrigated with a liter of saline.  The sartorius muscle is then identified in the lateral aspect of the wound it is then dissected circumferentially and rotated  to cover the artery and the vein.  Fibrillar and AdvaSeal are then used within the neurovascular bundle the sartorius muscle is then rotated over the vessels and tacked in place using 6  interrupted 2-0 Vicryl sutures.  A 19 Terrial Rhodes drain is then delivered into the medial aspect of the cavity which is the largest aspect and pulled through the skin of the distal thigh.  It is secured with a 2-0 nylon.  The incision is then closed using 3-0 Vicryl in a running fashion followed by surgical staples.  Blistered areas on the skin are covered with bacitracin ointment and ABDs followed by a Kerlix are then used to dress the wound.  The JP is hooked to bulb suction.  Patient tolerated procedure well and there were no immediate complications.   The patient tolerated this procedure well.   COMPLICATIONS: None  CONDITION: [facility]  Office: [phone]   12/21/2018, 5:58 PM

## 2018-12-21 NOTE — Plan of Care (Signed)
  Problem: Education: Goal: Knowledge of General Education information will improve Description: Including pain rating scale, medication(s)/side effects and non-pharmacologic comfort measures Outcome: Progressing Pt transferred from ICU.  Updated pt on his POC and he has no further questions.   Problem: Clinical Measurements: Goal: Will remain free from infection Outcome: Progressing S/Sx of infection monitored q-shift/ PRN, along with VS.  Pt has been afebrile thus far.  Will continue to monitor and assess.  Problem: Clinical Measurements: Goal: Respiratory complications will improve Outcome: Progressing Respiratory status monitored and assessed q-shift/ PRN.  Pt is on RA with O2 saturations at 100% and respiration rate of 20 breaths per minute.  Pt does not endorsed DOE or SOB.  He was noted to be tachypneic during a pain crisis earlier.  Respiration status returned to normal after pain was well managed.  Will continue to monitor and assess.   Problem: Activity: Goal: Risk for activity intolerance will decrease Outcome: Progressing Pt is able to reposition and turn himself in the bed.  He is x1 assist to the [facility].   Problem: Coping: Goal: Level of anxiety will decrease Outcome: Progressing Pt's was severely anxious during a pain crisis this shift.  He became less anxious after his pain was controlled.  Will continue to monitor and assess.   Problem: Elimination: Goal: Will not experience complications related to bowel motility Outcome: Progressing Pt is an HD pt  and is oliguria.  Problem: Pain Managment: Goal: General experience of comfort will improve Outcome: Progressing Pt has c/o 5-10/10 right upper leg pain r/t his surgical procedure describing it as a constant sharp ache.  Reiterate pain scale so he could adequately rate his pain.  Pt stat his pain goal this admission would be 0/10.  Discussed nonpharmacological methods to help reduce s/sx of pain.  Interventions given per  pt's request and MD's orders.      Problem: Skin Integrity: Goal: Risk for impaired skin integrity will decrease Outcome: Progressing  Skin integrity monitored and assessed q-shift/ PRN.  Instructed pt to turn q2 hours to prevent further skin impairment.  Tubes and drains assessed for device related pressures sores.  Dressing changes performed per MD's orders.  Will continue to monitor and assess.    Problem: Safety: Goal: Ability to remain free from injury will improve Outcome: Progressing Instructed pt and family to utilize RN call light for assistance.  Hourly rounds performed.  Bed alarm implemented to keep pt safe from falls.  Settings set to third most sensitive setting.  Bed in lowest position, locked with two upper side rails engaged.  Belongings and cal light within reach.

## 2018-12-22 ENCOUNTER — Encounter: Payer: Self-pay | Admitting: Vascular Surgery

## 2018-12-22 LAB — CBC
HCT: 22.2 % — ABNORMAL LOW (ref 39.0–52.0)
Hemoglobin: 7.3 g/dL — ABNORMAL LOW (ref 13.0–17.0)
MCH: 29.3 pg (ref 26.0–34.0)
MCHC: 32.9 g/dL (ref 30.0–36.0)
MCV: 89.2 fL (ref 80.0–100.0)
Platelets: 64 10*3/uL — ABNORMAL LOW (ref 150–400)
RBC: 2.49 MIL/uL — ABNORMAL LOW (ref 4.22–5.81)
RDW: 18.4 % — ABNORMAL HIGH (ref 11.5–15.5)
WBC: 6.5 10*3/uL (ref 4.0–10.5)
nRBC: 0 % (ref 0.0–0.2)

## 2018-12-22 LAB — GLUCOSE, CAPILLARY
Glucose-Capillary: 126 mg/dL — ABNORMAL HIGH (ref 70–99)
Glucose-Capillary: 128 mg/dL — ABNORMAL HIGH (ref 70–99)
Glucose-Capillary: 129 mg/dL — ABNORMAL HIGH (ref 70–99)
Glucose-Capillary: 96 mg/dL (ref 70–99)

## 2018-12-22 LAB — COMPREHENSIVE METABOLIC PANEL
ALT: 16 U/L (ref 0–44)
AST: 34 U/L (ref 15–41)
Albumin: 2.8 g/dL — ABNORMAL LOW (ref 3.5–5.0)
Alkaline Phosphatase: 59 U/L (ref 38–126)
Anion gap: 10 (ref 5–15)
BUN: 38 mg/dL — ABNORMAL HIGH (ref 6–20)
CO2: 25 mmol/L (ref 22–32)
Calcium: 6.4 mg/dL — CL (ref 8.9–10.3)
Chloride: 98 mmol/L (ref 98–111)
Creatinine, Ser: 8.97 mg/dL — ABNORMAL HIGH (ref 0.61–1.24)
GFR calc Af Amer: 7 mL/min — ABNORMAL LOW (ref >60)
GFR calc non Af Amer: 6 mL/min — ABNORMAL LOW (ref >60)
Glucose, Bld: 140 mg/dL — ABNORMAL HIGH (ref 70–99)
Potassium: 5.1 mmol/L (ref 3.5–5.1)
Sodium: 133 mmol/L — ABNORMAL LOW (ref 135–145)
Total Bilirubin: 0.4 mg/dL (ref 0.3–1.2)
Total Protein: 5.2 g/dL — ABNORMAL LOW (ref 6.5–8.1)

## 2018-12-22 LAB — MAGNESIUM: Magnesium: 1.8 mg/dL (ref 1.7–2.4)

## 2018-12-22 LAB — INSULIN AND C-PEPTIDE, SERUM
C-Peptide: 21.1 ng/mL — ABNORMAL HIGH (ref 1.1–4.4)
Insulin: 16.5 u[IU]/mL (ref 2.6–24.9)

## 2018-12-22 LAB — PROTIME-INR
INR: 1.2 (ref 0.8–1.2)
Prothrombin Time: 14.6 seconds (ref 11.4–15.2)

## 2018-12-22 LAB — PHOSPHORUS: Phosphorus: 4.9 mg/dL — ABNORMAL HIGH (ref 2.5–4.6)

## 2018-12-22 LAB — PREPARE RBC (CROSSMATCH)

## 2018-12-22 MED ORDER — CEFAZOLIN SODIUM-DEXTROSE 1-4 GM/50ML-% IV SOLN
1.0000 g | Freq: Two times a day (BID) | INTRAVENOUS | Status: AC
  Administered 2018-12-22 (×2): 1 g via INTRAVENOUS
  Filled 2018-12-22 (×3): qty 50

## 2018-12-22 MED ORDER — MORPHINE SULFATE (PF) 2 MG/ML IV SOLN
2.0000 mg | INTRAVENOUS | Status: DC | PRN
  Administered 2018-12-23 (×3): 2 mg via INTRAVENOUS
  Filled 2018-12-22 (×4): qty 1

## 2018-12-22 MED ORDER — CEFAZOLIN SODIUM-DEXTROSE 1-4 GM/50ML-% IV SOLN
1.0000 g | Freq: Two times a day (BID) | INTRAVENOUS | Status: DC
  Filled 2018-12-22 (×2): qty 50

## 2018-12-22 NOTE — Progress Notes (Signed)
HD Tx Initiated:    12/22/18 0914  Vital Signs  Temp 98 F (36.7 C)  Temp Source Axillary  Pulse Rate 87  Resp 20  BP 101/77  BP Location Right Arm  BP Method Automatic  Patient Position (if appropriate) Lying  Oxygen Therapy  SpO2 100 %  O2 Device Room Air  Pain Assessment  Pain Scale 0-10  Pain Score 0  Dialysis Weight  Weight 62.5 kg  Type of Weight Pre-Dialysis  During Hemodialysis Assessment  Blood Flow Rate (mL/min) 200 mL/min  Arterial Pressure (mmHg) -110 mmHg  Venous Pressure (mmHg) 60 mmHg  Transmembrane Pressure (mmHg) 60 mmHg  Ultrafiltration Rate (mL/min) 670 mL/min  Dialysate Flow Rate (mL/min) 600 ml/min  Conductivity: Machine  14.1  HD Safety Checks Performed Yes  Intra-Hemodialysis Comments Tx initiated  Hemodialysis Catheter Left Femoral vein Triple lumen Temporary (Non-Tunneled)  Placement Date/Time: 12/18/18 1222   Placed prior to admission: No  Time Out: Correct patient;Correct procedure;Correct site  Maximum sterile barrier precautions: Hand hygiene;Sterile gown;Sterile gloves;Cap;Mask;Large sterile sheet  Site Prep: Chlorh...  Site Condition No complications  Blue Lumen Status Blood return noted  Red Lumen Status Blood return noted  Purple Lumen Status N/A  Dressing Status Intact  Drainage Description None

## 2018-12-22 NOTE — Progress Notes (Signed)
Post HD Assessment:    12/22/18 1230  Neurological  Level of Consciousness Alert  Orientation Level Oriented X4  Respiratory  Respiratory Pattern Regular;Unlabored  Chest Assessment Chest expansion symmetrical  Bilateral Breath Sounds Clear  Cardiac  Pulse Regular  Heart Sounds S1, S2  Jugular Venous Distention (JVD) No  Cardiac Rhythm NSR  Antiarrhythmic device No  Vascular  R Radial Pulse +2  L Radial Pulse +2  R Dorsalis Pedis Pulse +1  Musculoskeletal  Musculoskeletal (WDL) X  Generalized Weakness Yes  Psychosocial  Psychosocial (WDL) WDL  Emotional support given Given to patient

## 2018-12-22 NOTE — Progress Notes (Signed)
Pre HD:     12/22/18 0900  Vital Signs  Temp 98 F (36.7 C)  Temp Source Axillary  Pulse Rate 85  Resp 20  BP 102/80  BP Location Right Arm  BP Method Automatic  Patient Position (if appropriate) Lying  Oxygen Therapy  SpO2 100 %  O2 Device Room Air  Pain Assessment  Pain Scale 0-10  Pain Score 0  Dialysis Weight  Weight 62.5 kg  Type of Weight Pre-Dialysis  Time-Out for Hemodialysis  What Procedure? HD   Pt Identifiers(min of two) First/Last Name;MRN/Account#;Pt's DOB(use if MRN/Acct# not available  Correct Site? Yes  Correct Side? Yes  Correct Procedure? Yes  Consents Verified? Yes  Rad Studies Available? N/A  Safety Precautions Reviewed? Yes  Engineer, civil (consulting) Number 3  Station Number  (ICU/BS)  UF/Alarm Test Passed  Conductivity: Meter 14  Conductivity: Machine  14.1  pH 7.4  Reverse Osmosis WRO 1  Normal Saline Lot Number [id number]  Dialyzer Lot Number [id number]  Disposable Set Lot Number [id number]  Machine Temperature 98.6 F (37 C)  Musician and Audible Yes  Blood Lines Intact and Secured Yes  Pre Treatment Patient Checks  Vascular access used during treatment Catheter  HD catheter dressing before treatment WDL  Patient is receiving dialysis in a chair  (no)  Hepatitis B Surface Antigen Results Negative  Date Hepatitis B Surface Antigen Drawn 12/18/18  Isolation Initiated  (no)  Hepatitis B Surface Antibody  (294)  Date Hepatitis B Surface Antibody Drawn 12/18/18  Hemodialysis Consent Verified Yes  Hemodialysis Standing Orders Initiated Yes  ECG (Telemetry) Monitor On Yes  Prime Ordered Normal Saline  Length of  DialysisTreatment -hour(s) 3 Hour(s)  Dialysis Treatment Comments  (Na 140)  Dialyzer Elisio 17H NR  Dialysate 2K;2.5 Ca  Dialysate Flow Ordered 600  Blood Flow Rate Ordered 400 mL/min  Ultrafiltration Goal 1.5 Liters  Dialysis Blood Pressure Support Ordered Normal Saline  Education / Care Plan  Dialysis Education  Provided Yes  Documented Education in Care Plan Yes  Outpatient Plan of Care Reviewed and on Chart Yes

## 2018-12-22 NOTE — Progress Notes (Signed)
Patient ID: Ralph Lucero, male   DOB: 11-19-1975, 43 y.o.   MRN: [id number]  [facility] PROGRESS NOTE  Ralph Lucero [id number] DOB: 05/23/1975 DOA: 12/17/2018 PCP: Anthonette Legato, MD  HPI/Subjective: Patient feeling fine.  Had a rough day yesterday with a lot of pain in his groin.  Now he feels okay.  Objective: Vitals:   12/22/18 1300 12/22/18 1400  BP: 114/84 112/82  Pulse: 94 (!) 104  Resp: 19 14  Temp:    SpO2: 100% 99%    Intake/Output Summary (Last 24 hours) at 12/22/2018 1503 Last data filed at 12/22/2018 1415 Gross per 24 hour  Intake 2019.45 ml  Output 1137 ml  Net 882.45 ml   Filed Weights   12/22/18 0900 12/22/18 0914 12/22/18 1230  Weight: 62.5 kg 62.5 kg 61.9 kg    ROS: Review of Systems  Constitutional: Negative for chills and fever.  Eyes: Negative for blurred vision.  Respiratory: Negative for cough and shortness of breath.   Cardiovascular: Negative for chest pain.  Gastrointestinal: Negative for abdominal pain, constipation, diarrhea, nausea and vomiting.  Genitourinary: Negative for dysuria.  Musculoskeletal: Negative for joint pain.  Neurological: Negative for dizziness and headaches.   Exam: Physical Exam  Constitutional: He is oriented to person, place, and time.  HENT:  Nose: No mucosal edema.  Mouth/Throat: No oropharyngeal exudate or posterior oropharyngeal edema.  Eyes: Pupils are equal, round, and reactive to light. Conjunctivae, EOM and lids are normal.  Neck: No JVD present. Carotid bruit is not present. No edema present. No thyroid mass and no thyromegaly present.  Cardiovascular: S1 normal and S2 normal. Exam reveals no gallop.  No murmur heard. Pulses:      Dorsalis pedis pulses are 2+ on the right side and 2+ on the left side.  Respiratory: No respiratory distress. He has no wheezes. He has no rhonchi. He has no rales.  GI: Soft. Bowel sounds are normal. There is no abdominal tenderness.   Musculoskeletal:     Right ankle: He exhibits no swelling.     Left ankle: He exhibits no swelling.  Lymphadenopathy:    He has no cervical adenopathy.  Neurological: He is alert and oriented to person, place, and time. No cranial nerve deficit.  Skin: Skin is warm. No rash noted. Nails show no clubbing.  Right groin covered  Psychiatric: He has a normal mood and affect.      Data Reviewed: Basic Metabolic Panel: Recent Labs  Lab 12/17/18 2348  12/19/18 0920 12/19/18 1433 12/20/18 0432 12/21/18 0616 12/21/18 1455 12/22/18 0358  NA 143  --  138  --  139 138  --  133*  K 6.8*   < > 6.0* 3.4* 4.3 4.4  --  5.1  CL 100  --  97*  --  100 100  --  98  CO2 24  --  26  --  28 27  --  25  GLUCOSE 124*  --  112*  --  119* 141* 117* 140*  BUN 94*  --  59*  --  34* 25*  --  38*  CREATININE 16.61*  --  13.03*  --  9.07* 6.86*  --  8.97*  CALCIUM 8.2*  --  7.4*  --  7.5* 7.7*  --  6.4*  MG 3.0*  --   --   --   --   --   --  1.8  PHOS  --   --  4.3  --  4.5  --   --  4.9*   < > = values in this interval not displayed.   Liver Function Tests: Recent Labs  Lab 12/17/18 2348 12/19/18 0920 12/20/18 0432 12/22/18 0358  AST 23  --   --  34  ALT 30  --   --  16  ALKPHOS 111  --   --  59  BILITOT 0.8  --   --  0.4  PROT 8.0  --   --  5.2*  ALBUMIN 4.5 3.4* 3.3* 2.8*   CBC: Recent Labs  Lab 12/17/18 2348 12/18/18 0648  12/20/18 0100  12/20/18 1030 12/20/18 1737 12/20/18 2356 12/21/18 0616 12/21/18 1846 12/22/18 0358  WBC 9.6 5.5  --  7.1  --  5.9 5.6  --  7.1 8.3 6.5  NEUTROABS 7.7 4.2  --  5.1  --   --   --   --  5.2  --   --   HGB 11.1* 9.8*   < > 7.0*   < > 6.9* 8.0* 8.4* 8.1* 5.3* 7.3*  HCT 34.9* 30.4*   < > 21.2*   < > 21.9* 24.3* 25.6* 24.7* 16.4* 22.2*  MCV 97.5 96.8  --  95.5  --  97.3 95.3  --  96.5 97.6 89.2  PLT 140* 142*  --  90*  --  83* 70*  --  69* 83* 64*   < > = values in this interval not displayed.   BNP (last 3 results) Recent Labs     12/17/18 2348  BNP 1,555.0*     CBG: Recent Labs  Lab 12/21/18 2257 12/22/18 0030 12/22/18 0211 12/22/18 0529 12/22/18 0743  GLUCAP 144* 126* 128* 129* 96    Recent Results (from the past 240 hour(s))  SARS Coronavirus 2 [facility] order, Performed in [facility] hospital lab) Nasopharyngeal Nasopharyngeal Swab     Status: None   Collection Time: 12/18/18 12:31 AM   Specimen: Nasopharyngeal Swab  Result Value Ref Range Status   SARS Coronavirus 2 NEGATIVE NEGATIVE Final    Comment: (NOTE) If result is NEGATIVE SARS-CoV-2 target nucleic acids are NOT DETECTED. The SARS-CoV-2 RNA is generally detectable in upper and lower  respiratory specimens during the acute phase of infection. The lowest  concentration of SARS-CoV-2 viral copies this assay can detect is 250  copies / mL. A negative result does not preclude SARS-CoV-2 infection  and should not be used as the sole basis for treatment or other  patient management decisions.  A negative result may occur with  improper specimen collection / handling, submission of specimen other  than nasopharyngeal swab, presence of viral mutation(s) within the  areas targeted by this assay, and inadequate number of viral copies  (<250 copies / mL). A negative result must be combined with clinical  observations, patient history, and epidemiological information. If result is POSITIVE SARS-CoV-2 target nucleic acids are DETECTED. The SARS-CoV-2 RNA is generally detectable in upper and lower  respiratory specimens dur ing the acute phase of infection.  Positive  results are indicative of active infection with SARS-CoV-2.  Clinical  correlation with patient history and other diagnostic information is  necessary to determine patient infection status.  Positive results do  not rule out bacterial infection or co-infection with other viruses. If result is PRESUMPTIVE POSTIVE SARS-CoV-2 nucleic acids MAY BE PRESENT.   A presumptive positive result  was obtained on the submitted specimen  and confirmed on repeat testing.  While 2019 novel coronavirus  (SARS-CoV-2) nucleic acids may be present  in the submitted sample  additional confirmatory testing may be necessary for epidemiological  and / or clinical management purposes  to differentiate between  SARS-CoV-2 and other Sarbecovirus currently known to infect humans.  If clinically indicated additional testing with an alternate test  methodology [id number]) is advised. The SARS-CoV-2 RNA is generally  detectable in upper and lower respiratory sp ecimens during the acute  phase of infection. The expected result is Negative. Fact Sheet for Patients:  StrictlyIdeas.no Fact Sheet for Healthcare Providers: BankingDealers.co.za This test is not yet approved or cleared by the [place] FDA and has been authorized for detection and/or diagnosis of SARS-CoV-2 by FDA under an Emergency Use Authorization (EUA).  This EUA will remain in effect (meaning this test can be used) for the duration of the COVID-19 declaration under Section 564(b)(1) of the Act, 21 U.S.C. section 360bbb-3(b)(1), unless the authorization is terminated or revoked sooner. Performed at [facility], [address] [place], [place] [address]   MRSA PCR Screening     Status: None   Collection Time: 12/18/18  2:32 PM   Specimen: Nasal Mucosa; Nasopharyngeal  Result Value Ref Range Status   MRSA by PCR NEGATIVE NEGATIVE Final    Comment:        The GeneXpert MRSA Assay (FDA approved for NASAL specimens only), is one component of a comprehensive MRSA colonization surveillance program. It is not intended to diagnose MRSA infection nor to guide or monitor treatment for MRSA infections. Performed at [facility], [address] [place], [place] [address]      Studies: Ct Angio Abd/pel W/ And/or W/o  Result Date: 12/21/2018 CLINICAL DATA:   43 year old male with a history of right thigh hematoma EXAM: CTA ABDOMEN AND PELVIS wITHOUT AND WITH CONTRAST TECHNIQUE: Multidetector CT imaging of the abdomen and pelvis was performed using the standard protocol during bolus administration of intravenous contrast. Multiplanar reconstructed images and MIPs were obtained and reviewed to evaluate the vascular anatomy. CONTRAST:  129mL OMNIPAQUE IOHEXOL 350 MG/ML SOLN COMPARISON:  CT December 19, 2018 FINDINGS: VASCULAR Aorta: Unremarkable course, caliber, contour of the abdominal aorta. No dissection, aneurysm, or periaortic fluid. Celiac: Unremarkable celiac artery. SMA: No significant atherosclerotic changes of the superior mesenteric artery. Renals: Small caliber bilateral renal arteries. IMA: Inferior mesenteric artery is patent. Left colic artery patent. Superior rectal artery patent. Right lower extremity: Right iliac artery without significant atherosclerotic changes. Hypogastric arteries patent. External iliac artery patent. Common femoral artery is unremarkable with unremarkable bifurcation. There is patent circumflex lateral iliac artery, lateral circumflex femoral artery, inferior epigastric artery, and the profunda femoris. No significant atherosclerotic changes of the SFA or profunda femoris. There is extraluminal contrast within the right thigh musculature extending irregular linear fashion laterally and anteriorly. This is best seen on image 148 of series 4. The contrast is continuous with the right aspect of the proximal SFA on image 153 and 154 of series 4. There is also early opacification of the associated femoral vein, with contrast extending through the proximal femoral vein, common femoral vein, and iliac system up to the IVC. This represents a fistula at this site. Distal SFA and profunda femoris branches remain patent with no dissection or narrowing. Left lower extremity: Unremarkable course, caliber, and contour of the left iliac system. No  aneurysm, dissection, or occlusion. Hypogastric artery is patent. Anterior and posterior division patent. Common femoral artery patent. Proximal SFA and profunda femoris patent. Veins: Early opacification of the right proximal femoral vein at  the site of the presumed arterial injury representing a fistula. Contrast extends through the proximal femoral vein, common femoral vein, iliac vein to the IVC. Left common femoral vein hemodialysis catheter. Review of the MIP images confirms the above findings. NON-VASCULAR Lower chest: No acute. Hepatobiliary: Unremarkable appearance of the liver. Unremarkable gall bladder. Pancreas: Unremarkable pancreas Spleen: Unremarkable Adrenals/Urinary Tract: Unremarkable appearance of adrenal glands. Right: Atrophic right kidney with multiple low-density lesions, poorly characterized on this early phase CT. No hydronephrosis or nephrolithiasis. Left: Atrophic left kidney with multiple low-density lesions, poorly characterized on this early phase CT. There is a single subcentimeter hyperdense lesion measuring 5 mm at the inferior pole. No hydronephrosis or nephrolithiasis. Contracted urinary bladder. Stomach/Bowel: Unremarkable appearance of the stomach. Unremarkable appearance of small bowel. No evidence of obstruction. No colonic diverticula. Normal appendix. Lymphatic: No lymphadenopathy Mesenteric: No free fluid or air. No adenopathy. Reproductive: Unremarkable appearance of the pelvic organs. Other: Fluid tracks along the superficial soft tissues of the right proximal thigh and right lateral pelvis overlying the lateral wall musculature. Redemonstration of proximal right thigh hematoma. The greatest estimated diameter anteriorly is 13.6 cm. There is no direct comparison at this site given the limits of the prior CT. M circumferential edema of the right thigh. Musculoskeletal: No acute displaced fracture. IMPRESSION: CT angiogram demonstrates evidence of both a pseudoaneurysm and  arteriovenous fistula with the most likely site from either the proximal right superficial femoral artery or a small muscular branch of either the proximal SFA or profunda femoris. There is evidence of extravasation of contrast from the site of arterial injury in the proximal thigh, and associated hematoma. The above preliminary results were discussed by telephone at the time of interpretation on 12/21/2018 at 3:22 pm with Dr. Hortencia Pilar. Greatest estimated diameter of the proximal thigh hematoma measures 13.6 cm. No direct comparison available. Fluid tracks along the superficial right lateral pelvic/abdominal wall musculature. Atrophic kidneys with multiple small hypodense lesions, poorly characterized on this single phase CT. If a more precise evaluation of these lesions is warranted, consider MRI. Ancillary findings as above. Electronically Signed   By: Corrie Mckusick D.O.   On: 12/21/2018 15:24    Scheduled Meds: . Chlorhexidine Gluconate Cloth  6 each Topical Q0600  . docusate sodium  100 mg Oral BID  . epoetin (EPOGEN/PROCRIT) injection  10,000 Units Intravenous Q T,Th,Sa-HD  . feeding supplement (NEPRO CARB STEADY)  237 mL Oral BID BM  . influenza vac split quadrivalent PF  0.5 mL Intramuscular Tomorrow-1000  . multivitamin  1 tablet Oral QHS   Continuous Infusions: .  ceFAZolin (ANCEF) IV 1 g (12/22/18 1320)    Assessment/Plan:  1. Right thigh hematoma, right superficial femoral artery pseudoaneurysm.  Patient went to the operating room last night for urgent repair.  Patient has no pain whatsoever right now. 2. Acute hypoxic respiratory failure.  Patient now on room air. 3. End-stage renal disease with hyperkalemia.  Patient receiving dialysis through temporary catheter 4. Dialysis access problem with the left fistula.  Will need a angiogram at some point. 5. CAD with elevated troponin.  As per cardiologist note supposed to be on aspirin and Brilinta. 6. Acute blood loss anemia with  hemoglobin last night dropping down to 5.3.  This morning up to 7.3  Code Status:     Code Status Orders  (From admission, onward)         Start     Ordered   12/18/18 0441  Full code  Continuous  12/18/18 0440        Code Status History    This patient has a current code status but no historical code status.   Advance Care Planning Activity     Family Communication: As per critical care team Disposition Plan: To be determined  Consultants:  Critical care specialist  Vascular surgery  Nephrology  Cardiology  Time spent: 28 minutes, through interpreter provided by the hospital.  [facility]

## 2018-12-22 NOTE — Progress Notes (Addendum)
Patient's parents died, so his aunt and her children have been taking care of him here in the [place], most of his family is back in [place].  His cousins are like his brothers.  Evelena Peat (one of his cousins) reported his family history to the RN.  Evelena Peat visited him today.  RN made him aware of current policy that patients who are recovering can have 1 visitor per admission and it must be the same visitor the whole admission.  Evelena Peat stated he would explain the hospital policy to his family.  Translation services was used today for MD and for RN.  Patient has been having pain in his leg where the hematoma was.  Patient reported that he is not in pain now, but when he moves, his leg hurts a lot.  Patient is receiving dialysis at this time.  Then he will transfer to the floor.  Patient has denied pain this shift and was able to swallow his medicines.  He is alert and oriented.  His blood pressure has improved.  Heart rate is within normal limits and lung sounds are clear & diminished.  Phillis Knack, RN

## 2018-12-22 NOTE — Consult Note (Signed)
PULMONARY / CRITICAL CARE MEDICINE  Name: Ralph Lucero MRN: [id number] DOB: Jul 08, 1975    LOS: 4  Referring Provider:  Dr Delana Meyer Reason for Referral:  S/P Repair right superficial femoral artery pseudoaneurysm,  Repair right femoral femoral traumatic AV fistula,   Evacuation right thigh hematoma and Sartorius muscle flap coverage of the artery and vein   HPI: This is a 43 yo male with ESRD on HD  who was initially admitted with pulmonary edema and hyperkalemia requiring Bipap after not having HD session on 09/5 due to inability to cannulate dialysis access site.  He developed a large pseudoaneurysm with a massive hematoma in the superficial femoral artery and femoral veins and a traumatic AV fistula status post agitation during a right femoral HD catheter placement attempt.  He was taken to the OR for hematoma evacuation and repair of the AV fistula.  He underwent a right superficial femoral artery pseudoaneurysm repair, repair of the right femoral traumatic AV fistula, evacuation of a right thigh hematoma and  sartorius muscle flap coverage of the artery and vein.  He was transferred to the ICU postprocedure for close monitoring due to postoperative hypotension and acute blood loss anemia.  He is currently being transfused 2 units of packed red blood cells.  He is awake, pleasant and offers no complaints.  He reports feeling much better postprocedure.  He denies chest pain, palpitations, shortness of breath, dizziness and headache.  Reports moderate left thigh pain.  He declined his pain medications.  Past Medical History:  Diagnosis Date  . ESRD (end stage renal disease) on dialysis ([facility])   . Hypertension   . Renal disorder    Past Surgical History:  Procedure Laterality Date  . TEMPORARY DIALYSIS CATHETER Left 12/18/2018   Procedure: TEMPORARY DIALYSIS CATHETER;  Surgeon: Katha Cabal, MD;  Location: [facility];  Service: Cardiovascular;  Laterality: Left;   No  current facility-administered medications on file prior to encounter.    Current Outpatient Medications on File Prior to Encounter  Medication Sig  . B Complex-C-Zn-Folic Acid (DIALYVITE/ZINC) TABS Take 1 tablet by mouth every evening.    Allergies No Known Allergies  Family History History reviewed. No pertinent family history. Social History  reports that he has never smoked. He has never used smokeless tobacco. No history on file for alcohol and drug.  Review Of Systems:   Constitutional: Negative for fever and chills.  HENT: Negative for congestion and rhinorrhea.  Eyes: Negative for redness and visual disturbance.  Respiratory: Negative for shortness of breath and wheezing.  Cardiovascular: Negative for chest pain and palpitations.  Gastrointestinal: Negative  for nausea , vomiting and abdominal pain and  Loose stools Genitourinary: Negative for dysuria and urgency.  Endocrine: Denies polyuria, polyphagia and heat intolerance Musculoskeletal: Reports right thigh pain Skin: Negative for pallor and wound.  Neurological: Negative for dizziness and headaches   VITAL SIGNS: BP 94/71 (BP Location: Right Arm)   Pulse 80   Temp 98.5 F (36.9 C)   Resp 18   Ht 6' (1.829 m)   Wt 60.5 kg   SpO2 100%   BMI 18.09 kg/m   HEMODYNAMICS:    VENTILATOR SETTINGS:    INTAKE / OUTPUT: I/O last 3 completed shifts: In: 3092.1 [P.O.:960; I.V.:1642.1; Blood:390; IV Piggyback:100] Out: 2100 [Other:1900; Blood:200]  PHYSICAL EXAMINATION: General: Awake, pleasant, in no distress HEENT: PERRLA, trachea midline, no JVD Neuro: Alert and oriented x4, no focal deficit Cardiovascular: Apical pulse regular, S1-S2, no murmur regurg  or gallop, +2 pulses bilaterally, no edema Lungs: Clear to auscultation bilaterally Abdomen: Nondistended, normal bowel sounds in all 4 quadrants, palpation reveals no organomegaly Musculoskeletal: Right thigh swollen with pressure dressing intact, JP drain  with moderate serosanguineous drainage Skin: Warm and dry, surgical dressing to left right eye intact  LABS:  BMET Recent Labs  Lab 12/19/18 0920 12/19/18 1433 12/20/18 0432 12/21/18 0616 12/21/18 1455  NA 138  --  139 138  --   K 6.0* 3.4* 4.3 4.4  --   CL 97*  --  100 100  --   CO2 26  --  28 27  --   BUN 59*  --  34* 25*  --   CREATININE 13.03*  --  9.07* 6.86*  --   GLUCOSE 112*  --  119* 141* 117*    Electrolytes Recent Labs  Lab 12/17/18 2348 12/19/18 0920 12/20/18 0432 12/21/18 0616  CALCIUM 8.2* 7.4* 7.5* 7.7*  MG 3.0*  --   --   --   PHOS  --  4.3 4.5  --     CBC Recent Labs  Lab 12/20/18 1737 12/20/18 2356 12/21/18 0616 12/21/18 1846  WBC 5.6  --  7.1 8.3  HGB 8.0* 8.4* 8.1* 5.3*  HCT 24.3* 25.6* 24.7* 16.4*  PLT 70*  --  69* 83*    Coag's Recent Labs  Lab 12/18/18 0648  APTT 33  INR 1.1    Sepsis Markers No results for input(s): LATICACIDVEN, PROCALCITON, O2SATVEN in the last 168 hours.  ABG No results for input(s): PHART, PCO2ART, PO2ART in the last 168 hours.  Liver Enzymes Recent Labs  Lab 12/17/18 2348 12/19/18 0920 12/20/18 0432  AST 23  --   --   ALT 30  --   --   ALKPHOS 111  --   --   BILITOT 0.8  --   --   ALBUMIN 4.5 3.4* 3.3*    Cardiac Enzymes No results for input(s): TROPONINI, PROBNP in the last 168 hours.  Glucose Recent Labs  Lab 12/21/18 0744 12/21/18 0948 12/21/18 1024 12/21/18 1159 12/21/18 1355 12/21/18 2257  GLUCAP 103* 55* 115* 155* 144* 144*    Imaging Ct Angio Abd/pel W/ And/or W/o  Result Date: 12/21/2018 CLINICAL DATA:  43 year old male with a history of right thigh hematoma EXAM: CTA ABDOMEN AND PELVIS wITHOUT AND WITH CONTRAST TECHNIQUE: Multidetector CT imaging of the abdomen and pelvis was performed using the standard protocol during bolus administration of intravenous contrast. Multiplanar reconstructed images and MIPs were obtained and reviewed to evaluate the vascular anatomy.  CONTRAST:  166mL OMNIPAQUE IOHEXOL 350 MG/ML SOLN COMPARISON:  CT December 19, 2018 FINDINGS: VASCULAR Aorta: Unremarkable course, caliber, contour of the abdominal aorta. No dissection, aneurysm, or periaortic fluid. Celiac: Unremarkable celiac artery. SMA: No significant atherosclerotic changes of the superior mesenteric artery. Renals: Small caliber bilateral renal arteries. IMA: Inferior mesenteric artery is patent. Left colic artery patent. Superior rectal artery patent. Right lower extremity: Right iliac artery without significant atherosclerotic changes. Hypogastric arteries patent. External iliac artery patent. Common femoral artery is unremarkable with unremarkable bifurcation. There is patent circumflex lateral iliac artery, lateral circumflex femoral artery, inferior epigastric artery, and the profunda femoris. No significant atherosclerotic changes of the SFA or profunda femoris. There is extraluminal contrast within the right thigh musculature extending irregular linear fashion laterally and anteriorly. This is best seen on image 148 of series 4. The contrast is continuous with the right aspect of the proximal SFA  on image 153 and 154 of series 4. There is also early opacification of the associated femoral vein, with contrast extending through the proximal femoral vein, common femoral vein, and iliac system up to the IVC. This represents a fistula at this site. Distal SFA and profunda femoris branches remain patent with no dissection or narrowing. Left lower extremity: Unremarkable course, caliber, and contour of the left iliac system. No aneurysm, dissection, or occlusion. Hypogastric artery is patent. Anterior and posterior division patent. Common femoral artery patent. Proximal SFA and profunda femoris patent. Veins: Early opacification of the right proximal femoral vein at the site of the presumed arterial injury representing a fistula. Contrast extends through the proximal femoral vein, common  femoral vein, iliac vein to the IVC. Left common femoral vein hemodialysis catheter. Review of the MIP images confirms the above findings. NON-VASCULAR Lower chest: No acute. Hepatobiliary: Unremarkable appearance of the liver. Unremarkable gall bladder. Pancreas: Unremarkable pancreas Spleen: Unremarkable Adrenals/Urinary Tract: Unremarkable appearance of adrenal glands. Right: Atrophic right kidney with multiple low-density lesions, poorly characterized on this early phase CT. No hydronephrosis or nephrolithiasis. Left: Atrophic left kidney with multiple low-density lesions, poorly characterized on this early phase CT. There is a single subcentimeter hyperdense lesion measuring 5 mm at the inferior pole. No hydronephrosis or nephrolithiasis. Contracted urinary bladder. Stomach/Bowel: Unremarkable appearance of the stomach. Unremarkable appearance of small bowel. No evidence of obstruction. No colonic diverticula. Normal appendix. Lymphatic: No lymphadenopathy Mesenteric: No free fluid or air. No adenopathy. Reproductive: Unremarkable appearance of the pelvic organs. Other: Fluid tracks along the superficial soft tissues of the right proximal thigh and right lateral pelvis overlying the lateral wall musculature. Redemonstration of proximal right thigh hematoma. The greatest estimated diameter anteriorly is 13.6 cm. There is no direct comparison at this site given the limits of the prior CT. M circumferential edema of the right thigh. Musculoskeletal: No acute displaced fracture. IMPRESSION: CT angiogram demonstrates evidence of both a pseudoaneurysm and arteriovenous fistula with the most likely site from either the proximal right superficial femoral artery or a small muscular branch of either the proximal SFA or profunda femoris. There is evidence of extravasation of contrast from the site of arterial injury in the proximal thigh, and associated hematoma. The above preliminary results were discussed by telephone  at the time of interpretation on 12/21/2018 at 3:22 pm with Dr. Hortencia Pilar. Greatest estimated diameter of the proximal thigh hematoma measures 13.6 cm. No direct comparison available. Fluid tracks along the superficial right lateral pelvic/abdominal wall musculature. Atrophic kidneys with multiple small hypodense lesions, poorly characterized on this single phase CT. If a more precise evaluation of these lesions is warranted, consider MRI. Ancillary findings as above. Electronically Signed   By: Corrie Mckusick D.O.   On: 12/21/2018 15:24     STUDIES:  None  CULTURES: None  ANTIBIOTICS: None  SIGNIFICANT EVENTS: 12/21/2018: OR for hematoma evacuation and traumatic femoral to femoral arteriovenous fistula repair  LINES/TUBES: Peripheral IVs  DISCUSSION: 43 year old male being admitted to the ICU with acute blood loss anemia, and postoperative hypotension due to right femoral artery pseudoaneurysm repair, right femoral femoral traumatic AV fistula repair and evacuation of right thigh hematoma  ASSESSMENT  Right thigh hematoma s/p evacuation Rt superficial femoral artery pseudoaneurysm s/p repair Right femoral-Femoral traumatic AV fistula s/p repair Acute blood loss anemia Post-operative hypotension ESRD ON HD HD fistula malfunction  PLAN Transfuse 2 units of PRBCs Trend H/H and re-transfuse prn Hemodynamic monitoring per ICU protocol Monitor hematoma evacuation  site for bleeding Pain management with prn morphine Nephrology following for HD IV fluids and prn pressors to maintain MAP>65 Monitor and correct electrolytes  Best Practice: Code Status:Full code Diet: Renal diet GI prophylaxis: Not indicated VTE prophylaxis:  SCDs/no pharmacologic VTE prophylaxis  FAMILY  - Updates: Patient updated on current treatment plan.     S. [facility] ANP-BC Pulmonary and Critical Care Medicine [facility] Pager [phone] or [phone]  NB: This document was  prepared using Dragon voice recognition software and may include unintentional dictation errors.    12/22/2018, 12:19 AM

## 2018-12-22 NOTE — Progress Notes (Signed)
    Subjective  - POD #1, s/p repair of right femoral A-V fistula  Still with lots of pain   Physical Exam:  Incision looks good Minimal edema at incision site Brisk pedal doppler signals  Drain output = 40cc    Assessment/Plan:  POD #1  Keep JP in place  Acute blood loss anemia:  Received blood post op.  Continue to monitor Hb Activity:  No restrictions, may be out of bed and walking Will need DVT prophylaxis with SQ heparin / lovenox ( if he is not ambulating. ) once Hb stabilizes, recommend SCD's currently Needs to mobilize and get out of bed Patient seen with spanish interpreter  [name] 12/22/2018 1:55 PM --  Vitals:   12/22/18 1225 12/22/18 1300  BP: 101/71 114/84  Pulse: 73 94  Resp: 16 19  Temp: 98.5 F (36.9 C)   SpO2: 99% 100%    Intake/Output Summary (Last 24 hours) at 12/22/2018 1355 Last data filed at 12/22/2018 0654 Gross per 24 hour  Intake 2167.72 ml  Output 240 ml  Net 1927.72 ml     Laboratory CBC    Component Value Date/Time   WBC 6.5 12/22/2018 0358   HGB 7.3 (L) 12/22/2018 0358   HCT 22.2 (L) 12/22/2018 0358   PLT 64 (L) 12/22/2018 0358    BMET    Component Value Date/Time   NA 133 (L) 12/22/2018 0358   K 5.1 12/22/2018 0358   CL 98 12/22/2018 0358   CO2 25 12/22/2018 0358   GLUCOSE 140 (H) 12/22/2018 0358   BUN 38 (H) 12/22/2018 0358   CREATININE 8.97 (H) 12/22/2018 0358   CALCIUM 6.4 (LL) 12/22/2018 0358   GFRNONAA 6 (L) 12/22/2018 0358   GFRAA 7 (L) 12/22/2018 0358    COAG Lab Results  Component Value Date   INR 1.2 12/22/2018   INR 1.1 12/18/2018   No results found for: PTT  Antibiotics Anti-infectives (From admission, onward)   Start     Dose/Rate Route Frequency Ordered Stop   12/22/18 1300  ceFAZolin (ANCEF) IVPB 1 g/50 mL premix    Note to Pharmacy: Send with pt to OR   1 g 100 mL/hr over 30 Minutes Intravenous Every 12 hours 12/22/18 1023 12/23/18 1259   12/22/18 1000  ceFAZolin (ANCEF) IVPB 1  g/50 mL premix  Status:  Discontinued    Note to Pharmacy: Send with pt to OR   1 g 100 mL/hr over 30 Minutes Intravenous Every 12 hours 12/22/18 0018 12/22/18 1023   12/22/18 0000  ceFAZolin (ANCEF) IVPB 1 g/50 mL premix    Note to Pharmacy: Send with pt to OR   1 g 100 mL/hr over 30 Minutes Intravenous On call 12/21/18 1543 12/21/18 1645       V. Leia Alf, M.D., [facility] [employer] Office: [phone] Pager:  [phone]

## 2018-12-22 NOTE — Progress Notes (Signed)
[facility]  ROUNDING NOTE   Subjective:   Seen and examined on hemodialysis treatment. Tolerating treatment well.   History taken with assistance of Spanish Interpreter  Status post repair of pseudoaneurysm and evacuation of hematoma.     HEMODIALYSIS FLOWSHEET:  Blood Flow Rate (mL/min): 275 mL/min Arterial Pressure (mmHg): -230 mmHg Venous Pressure (mmHg): 190 mmHg Transmembrane Pressure (mmHg): 60 mmHg Ultrafiltration Rate (mL/min): 0 mL/min Dialysate Flow Rate (mL/min): 600 ml/min Conductivity: Machine : 14 Conductivity: Machine : 14 Dialysis Fluid Bolus: Normal Saline Bolus Amount (mL): 250 mL    Objective:  Vital signs in last 24 hours:  Temp:  [97.8 F (36.6 C)-101.4 F (38.6 C)] 98 F (36.7 C) (09/12 0914) Pulse Rate:  [64-93] 76 (09/12 0945) Resp:  [12-20] 14 (09/12 0945) BP: (80-112)/(48-82) 93/68 (09/12 0945) SpO2:  [97 %-100 %] 99 % (09/12 0945) Weight:  [62.5 kg] 62.5 kg (09/12 0914)  Weight change:  Filed Weights   12/19/18 1545 12/22/18 0900 12/22/18 0914  Weight: 60.5 kg 62.5 kg 62.5 kg    Intake/Output: I/O last 3 completed shifts: In: 2647.5 [P.O.:480; I.V.:1247.5; Blood:920] Out: 240 [Drains:40; Blood:200]   Intake/Output this shift:  No intake/output data recorded.  Physical Exam: General: Laying in bed  Head: Normocephalic, atraumatic. Moist oral mucosal membranes  Eyes: Anicteric, PERRL, left +pterygium  Neck: Supple, trachea midline  Lungs:  clear  Heart: Regular rate and rhythm  Abdomen:  Soft, nontender,   Extremities: no peripheral edema. Right thigh dressings with drain  Neurologic: sedated  Skin: No lesions  Access: Left AVF + thrill, +bruit,  Left femoral temp HD catheter Dr. Delana Meyer 9/8    Basic Metabolic Panel: Recent Labs  Lab 12/17/18 2348  12/19/18 0920 12/19/18 1433 12/20/18 0432 12/21/18 0616 12/21/18 1455 12/22/18 0358  NA 143  --  138  --  139 138  --  133*  K 6.8*   < > 6.0* 3.4* 4.3 4.4   --  5.1  CL 100  --  97*  --  100 100  --  98  CO2 24  --  26  --  28 27  --  25  GLUCOSE 124*  --  112*  --  119* 141* 117* 140*  BUN 94*  --  59*  --  34* 25*  --  38*  CREATININE 16.61*  --  13.03*  --  9.07* 6.86*  --  8.97*  CALCIUM 8.2*  --  7.4*  --  7.5* 7.7*  --  6.4*  MG 3.0*  --   --   --   --   --   --  1.8  PHOS  --   --  4.3  --  4.5  --   --  4.9*   < > = values in this interval not displayed.    Liver Function Tests: Recent Labs  Lab 12/17/18 2348 12/19/18 0920 12/20/18 0432 12/22/18 0358  AST 23  --   --  34  ALT 30  --   --  16  ALKPHOS 111  --   --  59  BILITOT 0.8  --   --  0.4  PROT 8.0  --   --  5.2*  ALBUMIN 4.5 3.4* 3.3* 2.8*   No results for input(s): LIPASE, AMYLASE in the last 168 hours. No results for input(s): AMMONIA in the last 168 hours.  CBC: Recent Labs  Lab 12/17/18 2348 12/18/18 0648  12/20/18 0100  12/20/18 1030  12/20/18 1737 12/20/18 2356 12/21/18 0616 12/21/18 1846 12/22/18 0358  WBC 9.6 5.5  --  7.1  --  5.9 5.6  --  7.1 8.3 6.5  NEUTROABS 7.7 4.2  --  5.1  --   --   --   --  5.2  --   --   HGB 11.1* 9.8*   < > 7.0*   < > 6.9* 8.0* 8.4* 8.1* 5.3* 7.3*  HCT 34.9* 30.4*   < > 21.2*   < > 21.9* 24.3* 25.6* 24.7* 16.4* 22.2*  MCV 97.5 96.8  --  95.5  --  97.3 95.3  --  96.5 97.6 89.2  PLT 140* 142*  --  90*  --  83* 70*  --  69* 83* 64*   < > = values in this interval not displayed.    Cardiac Enzymes: No results for input(s): CKTOTAL, CKMB, CKMBINDEX, TROPONINI in the last 168 hours.  BNP: Invalid input(s): POCBNP  CBG: Recent Labs  Lab 12/21/18 2257 12/22/18 0030 12/22/18 0211 12/22/18 0529 12/22/18 0743  GLUCAP 144* 126* 128* 129* 59    Microbiology: Results for orders placed or performed during the hospital encounter of 12/17/18  SARS Coronavirus 2 [facility] order, Performed in [facility] hospital lab) Nasopharyngeal Nasopharyngeal Swab     Status: None   Collection Time: 12/18/18 12:31 AM   Specimen:  Nasopharyngeal Swab  Result Value Ref Range Status   SARS Coronavirus 2 NEGATIVE NEGATIVE Final    Comment: (NOTE) If result is NEGATIVE SARS-CoV-2 target nucleic acids are NOT DETECTED. The SARS-CoV-2 RNA is generally detectable in upper and lower  respiratory specimens during the acute phase of infection. The lowest  concentration of SARS-CoV-2 viral copies this assay can detect is 250  copies / mL. A negative result does not preclude SARS-CoV-2 infection  and should not be used as the sole basis for treatment or other  patient management decisions.  A negative result may occur with  improper specimen collection / handling, submission of specimen other  than nasopharyngeal swab, presence of viral mutation(s) within the  areas targeted by this assay, and inadequate number of viral copies  (<250 copies / mL). A negative result must be combined with clinical  observations, patient history, and epidemiological information. If result is POSITIVE SARS-CoV-2 target nucleic acids are DETECTED. The SARS-CoV-2 RNA is generally detectable in upper and lower  respiratory specimens dur ing the acute phase of infection.  Positive  results are indicative of active infection with SARS-CoV-2.  Clinical  correlation with patient history and other diagnostic information is  necessary to determine patient infection status.  Positive results do  not rule out bacterial infection or co-infection with other viruses. If result is PRESUMPTIVE POSTIVE SARS-CoV-2 nucleic acids MAY BE PRESENT.   A presumptive positive result was obtained on the submitted specimen  and confirmed on repeat testing.  While 2019 novel coronavirus  (SARS-CoV-2) nucleic acids may be present in the submitted sample  additional confirmatory testing may be necessary for epidemiological  and / or clinical management purposes  to differentiate between  SARS-CoV-2 and other Sarbecovirus currently known to infect humans.  If clinically  indicated additional testing with an alternate test  methodology [phone]) is advised. The SARS-CoV-2 RNA is generally  detectable in upper and lower respiratory sp ecimens during the acute  phase of infection. The expected result is Negative. Fact Sheet for Patients:  StrictlyIdeas.no Fact Sheet for Healthcare Providers: BankingDealers.co.za This test is not yet  approved or cleared by the [place] and has been authorized for detection and/or diagnosis of SARS-CoV-2 by FDA under an Emergency Use Authorization (EUA).  This EUA will remain in effect (meaning this test can be used) for the duration of the COVID-19 declaration under Section 564(b)(1) of the Act, 21 U.S.C. section 360bbb-3(b)(1), unless the authorization is terminated or revoked sooner. Performed at [facility], [address] [place], [place] [address]   MRSA PCR Screening     Status: None   Collection Time: 12/18/18  2:32 PM   Specimen: Nasal Mucosa; Nasopharyngeal  Result Value Ref Range Status   MRSA by PCR NEGATIVE NEGATIVE Final    Comment:        The GeneXpert MRSA Assay (FDA approved for NASAL specimens only), is one component of a comprehensive MRSA colonization surveillance program. It is not intended to diagnose MRSA infection nor to guide or monitor treatment for MRSA infections. Performed at [facility], [address] [place], [place] [address]     Coagulation Studies: Recent Labs    12/22/18 0358  LABPROT 14.6  INR 1.2    Urinalysis: No results for input(s): COLORURINE, LABSPEC, PHURINE, GLUCOSEU, HGBUR, BILIRUBINUR, KETONESUR, PROTEINUR, UROBILINOGEN, NITRITE, LEUKOCYTESUR in the last 72 hours.  Invalid input(s): APPERANCEUR    Imaging: Ct Angio Abd/pel W/ And/or W/o  Result Date: 12/21/2018 CLINICAL DATA:  43 year old male with a history of right thigh hematoma EXAM: CTA ABDOMEN AND PELVIS wITHOUT AND WITH  CONTRAST TECHNIQUE: Multidetector CT imaging of the abdomen and pelvis was performed using the standard protocol during bolus administration of intravenous contrast. Multiplanar reconstructed images and MIPs were obtained and reviewed to evaluate the vascular anatomy. CONTRAST:  152mL OMNIPAQUE IOHEXOL 350 MG/ML SOLN COMPARISON:  CT December 19, 2018 FINDINGS: VASCULAR Aorta: Unremarkable course, caliber, contour of the abdominal aorta. No dissection, aneurysm, or periaortic fluid. Celiac: Unremarkable celiac artery. SMA: No significant atherosclerotic changes of the superior mesenteric artery. Renals: Small caliber bilateral renal arteries. IMA: Inferior mesenteric artery is patent. Left colic artery patent. Superior rectal artery patent. Right lower extremity: Right iliac artery without significant atherosclerotic changes. Hypogastric arteries patent. External iliac artery patent. Common femoral artery is unremarkable with unremarkable bifurcation. There is patent circumflex lateral iliac artery, lateral circumflex femoral artery, inferior epigastric artery, and the profunda femoris. No significant atherosclerotic changes of the SFA or profunda femoris. There is extraluminal contrast within the right thigh musculature extending irregular linear fashion laterally and anteriorly. This is best seen on image 148 of series 4. The contrast is continuous with the right aspect of the proximal SFA on image 153 and 154 of series 4. There is also early opacification of the associated femoral vein, with contrast extending through the proximal femoral vein, common femoral vein, and iliac system up to the IVC. This represents a fistula at this site. Distal SFA and profunda femoris branches remain patent with no dissection or narrowing. Left lower extremity: Unremarkable course, caliber, and contour of the left iliac system. No aneurysm, dissection, or occlusion. Hypogastric artery is patent. Anterior and posterior division  patent. Common femoral artery patent. Proximal SFA and profunda femoris patent. Veins: Early opacification of the right proximal femoral vein at the site of the presumed arterial injury representing a fistula. Contrast extends through the proximal femoral vein, common femoral vein, iliac vein to the IVC. Left common femoral vein hemodialysis catheter. Review of the MIP images confirms the above findings. NON-VASCULAR Lower chest: No acute. Hepatobiliary: Unremarkable  appearance of the liver. Unremarkable gall bladder. Pancreas: Unremarkable pancreas Spleen: Unremarkable Adrenals/Urinary Tract: Unremarkable appearance of adrenal glands. Right: Atrophic right kidney with multiple low-density lesions, poorly characterized on this early phase CT. No hydronephrosis or nephrolithiasis. Left: Atrophic left kidney with multiple low-density lesions, poorly characterized on this early phase CT. There is a single subcentimeter hyperdense lesion measuring 5 mm at the inferior pole. No hydronephrosis or nephrolithiasis. Contracted urinary bladder. Stomach/Bowel: Unremarkable appearance of the stomach. Unremarkable appearance of small bowel. No evidence of obstruction. No colonic diverticula. Normal appendix. Lymphatic: No lymphadenopathy Mesenteric: No free fluid or air. No adenopathy. Reproductive: Unremarkable appearance of the pelvic organs. Other: Fluid tracks along the superficial soft tissues of the right proximal thigh and right lateral pelvis overlying the lateral wall musculature. Redemonstration of proximal right thigh hematoma. The greatest estimated diameter anteriorly is 13.6 cm. There is no direct comparison at this site given the limits of the prior CT. M circumferential edema of the right thigh. Musculoskeletal: No acute displaced fracture. IMPRESSION: CT angiogram demonstrates evidence of both a pseudoaneurysm and arteriovenous fistula with the most likely site from either the proximal right superficial femoral  artery or a small muscular branch of either the proximal SFA or profunda femoris. There is evidence of extravasation of contrast from the site of arterial injury in the proximal thigh, and associated hematoma. The above preliminary results were discussed by telephone at the time of interpretation on 12/21/2018 at 3:22 pm with Dr. Hortencia Pilar. Greatest estimated diameter of the proximal thigh hematoma measures 13.6 cm. No direct comparison available. Fluid tracks along the superficial right lateral pelvic/abdominal wall musculature. Atrophic kidneys with multiple small hypodense lesions, poorly characterized on this single phase CT. If a more precise evaluation of these lesions is warranted, consider MRI. Ancillary findings as above. Electronically Signed   By: Corrie Mckusick D.O.   On: 12/21/2018 15:24     Medications:   .  ceFAZolin (ANCEF) IV     . Chlorhexidine Gluconate Cloth  6 each Topical Q0600  . docusate sodium  100 mg Oral BID  . epoetin (EPOGEN/PROCRIT) injection  10,000 Units Intravenous Q T,Th,Sa-HD  . feeding supplement (NEPRO CARB STEADY)  237 mL Oral BID BM  . influenza vac split quadrivalent PF  0.5 mL Intramuscular Tomorrow-1000  . multivitamin  1 tablet Oral QHS   acetaminophen **OR** [DISCONTINUED] acetaminophen, calcium carbonate, heparin, HYDROcodone-acetaminophen, LORazepam, morphine injection, [DISCONTINUED] ondansetron **OR** ondansetron (ZOFRAN) IV  Assessment/ Plan:  Mr. Ralph Lucero is a 43 y.o. Hispanic Spanish Speaking only male with end stage renal disease on hemodialysis followed by [facility], hypertension, coronary artery disease, secondary hyperparathyroidism status post parathyroidectomy who presents to [facility] with hyperkalemia and pulmonary edema.   [facility] T[facility] [place] Left AVF  1. End Stage Renal Disease with hyperkalemia: complication with dialysis device.  Extra dialysis treatment yesterday due to hyperkalemia.  -  Appreciate vascular input. Will need AVF functioning before discharge.  - Seen and examined on hemodialysis   2. Hypertension: history of difficult to control. Hypertensive urgency on admission.  Home regimen of metoprolol, lisinopril, isosorbide mononitrate and felodipine.  Currently holding home bp meds.   3. Anemia of chronic kidney disease: with blood loss Secondary to hematoma. Status post PRBC transfusion on 9/9 and second transfusion today 9/10 Hemoglobin stable 7.3 - EPO with HD treatment TTS   4. Secondary Hyperparathyroidism status post parathyroidectomy  - calcium acetate with meals.    5. Diabetes mellitus type  II with chronic kidney disease  6. Right thigh hematoma: status post repair on pseudoanuerysm and evacuation of hematoma by Dr. Delana Meyer on 9/11.    LOS: 4 Ranald Alessio 9/12/202010:00 AM

## 2018-12-22 NOTE — Progress Notes (Signed)
Post HD Tx:    12/22/18 1230  Vital Signs  Temp 98.5 F (36.9 C)  Temp Source Oral  Pulse Rate 80  Pulse Rate Source Dinamap  Resp 14  BP 97/73  BP Location Right Arm  BP Method Automatic  Patient Position (if appropriate) Lying  Oxygen Therapy  SpO2 100 %  O2 Device Room Air  Pain Assessment  Pain Scale 0-10  Pain Score 0  Post-Hemodialysis Assessment  Rinseback Volume (mL) 250 mL  KECN 45.9 V  Dialyzer Clearance Lightly streaked  Duration of HD Treatment -hour(s) 3 hour(s)  Hemodialysis Intake (mL) 500 mL  UF Total -Machine (mL) 1382 mL  Net UF (mL) 882 mL  Tolerated HD Treatment Yes  Post-Hemodialysis Comments UF goal not met d/t decrease in SBP during HD Tx.  AVG/AVF Arterial Site Held (minutes)  (n/a)  AVG/AVF Venous Site Held (minutes)  (n/a )  Education / Care Plan  Dialysis Education Provided Yes  Documented Education in Care Plan Yes  Outpatient Plan of Care Reviewed and on Chart Yes  Hemodialysis Catheter Left Femoral vein Triple lumen Temporary (Non-Tunneled)  Placement Date/Time: 12/18/18 1222   Placed prior to admission: No  Time Out: Correct patient;Correct procedure;Correct site  Maximum sterile barrier precautions: Hand hygiene;Sterile gown;Sterile gloves;Cap;Mask;Large sterile sheet  Site Prep: Chlorh...  Site Condition No complications  Blue Lumen Status Heparin locked  Red Lumen Status Heparin locked  Purple Lumen Status N/A  Catheter fill solution Heparin 1000 units/ml  Catheter fill volume (Arterial) 1.8 cc  Catheter fill volume (Venous) 1.8  Dressing Type Biopatch;Occlusive  Dressing Status Intact  Drainage Description None  Post treatment catheter status Capped and Clamped

## 2018-12-22 NOTE — Progress Notes (Signed)
HD Tx Completed: HD Tx completed with high arterial pressures, repositioning somewht improves but with gradual increase. , Pt tolerated well.Arterial and venous lines fill with heparin 1.8 cc; capped and clamped.     12/22/18 1225  Vital Signs  Temp 98.5 F (36.9 C)  Temp Source Oral  Pulse Rate 73  Pulse Rate Source Dinamap  Resp 16  BP 101/71  BP Location Right Arm  BP Method Automatic  Patient Position (if appropriate) Lying  Oxygen Therapy  SpO2 99 %  O2 Device Room Air  Pain Assessment  Pain Scale 0-10  Pain Score 0  During Hemodialysis Assessment  KECN 45.9 KECN  Intra-Hemodialysis Comments Tx completed;Tolerated well

## 2018-12-22 NOTE — Progress Notes (Signed)
Pre HD Assessment:    12/22/18 0902  Neurological  Level of Consciousness Alert  Orientation Level Oriented X4  Respiratory  Respiratory Pattern Regular;Unlabored  Chest Assessment Chest expansion symmetrical  Bilateral Breath Sounds Clear  Cardiac  Pulse Regular  Heart Sounds S1, S2  Jugular Venous Distention (JVD) No  Cardiac Rhythm NSR  Antiarrhythmic device No  Vascular  R Radial Pulse +2  L Radial Pulse +2  R Dorsalis Pedis Pulse +1  Musculoskeletal  Musculoskeletal (WDL) X  Generalized Weakness Yes  Psychosocial  Psychosocial (WDL) WDL  Emotional support given Given to patient

## 2018-12-23 ENCOUNTER — Other Ambulatory Visit: Payer: Self-pay

## 2018-12-23 LAB — BASIC METABOLIC PANEL
Anion gap: 10 (ref 5–15)
BUN: 37 mg/dL — ABNORMAL HIGH (ref 6–20)
CO2: 27 mmol/L (ref 22–32)
Calcium: 6.9 mg/dL — ABNORMAL LOW (ref 8.9–10.3)
Chloride: 100 mmol/L (ref 98–111)
Creatinine, Ser: 7.52 mg/dL — ABNORMAL HIGH (ref 0.61–1.24)
GFR calc Af Amer: 9 mL/min — ABNORMAL LOW (ref >60)
GFR calc non Af Amer: 8 mL/min — ABNORMAL LOW (ref >60)
Glucose, Bld: 98 mg/dL (ref 70–99)
Potassium: 4.1 mmol/L (ref 3.5–5.1)
Sodium: 137 mmol/L (ref 135–145)

## 2018-12-23 LAB — BPAM RBC
Blood Product Expiration Date: 202009302359
Blood Product Expiration Date: 202010072359
Blood Product Expiration Date: 202010072359
Blood Product Expiration Date: 202010142359
Blood Product Expiration Date: 202010142359
Blood Product Expiration Date: 202010152359
ISSUE DATE / TIME: 202009092110
ISSUE DATE / TIME: 202009101136
ISSUE DATE / TIME: 202009111905
ISSUE DATE / TIME: 202009112206
Unit Type and Rh: 6200
Unit Type and Rh: 6200
Unit Type and Rh: 6200
Unit Type and Rh: 6200
Unit Type and Rh: 6200
Unit Type and Rh: 6200

## 2018-12-23 LAB — TYPE AND SCREEN
ABO/RH(D): A POS
Antibody Screen: NEGATIVE
Unit division: 0
Unit division: 0
Unit division: 0
Unit division: 0
Unit division: 0
Unit division: 0

## 2018-12-23 LAB — CBC
HCT: 20.5 % — ABNORMAL LOW (ref 39.0–52.0)
Hemoglobin: 6.6 g/dL — ABNORMAL LOW (ref 13.0–17.0)
MCH: 29.3 pg (ref 26.0–34.0)
MCHC: 32.2 g/dL (ref 30.0–36.0)
MCV: 91.1 fL (ref 80.0–100.0)
Platelets: 83 10*3/uL — ABNORMAL LOW (ref 150–400)
RBC: 2.25 MIL/uL — ABNORMAL LOW (ref 4.22–5.81)
RDW: 18.1 % — ABNORMAL HIGH (ref 11.5–15.5)
WBC: 5.8 10*3/uL (ref 4.0–10.5)
nRBC: 0.5 % — ABNORMAL HIGH (ref 0.0–0.2)

## 2018-12-23 LAB — GLUCOSE, CAPILLARY
Glucose-Capillary: 53 mg/dL — ABNORMAL LOW (ref 70–99)
Glucose-Capillary: 67 mg/dL — ABNORMAL LOW (ref 70–99)
Glucose-Capillary: 65 mg/dL — ABNORMAL LOW (ref 70–99)

## 2018-12-23 LAB — PREPARE RBC (CROSSMATCH)

## 2018-12-23 MED ORDER — SODIUM CHLORIDE 0.9% IV SOLUTION
Freq: Once | INTRAVENOUS | Status: AC
  Administered 2018-12-23: 18:00:00 via INTRAVENOUS

## 2018-12-23 MED ORDER — ACETAMINOPHEN 325 MG PO TABS
650.0000 mg | ORAL_TABLET | Freq: Once | ORAL | Status: AC
  Administered 2018-12-23: 18:00:00 650 mg via ORAL

## 2018-12-23 MED ORDER — FUROSEMIDE 10 MG/ML IJ SOLN
40.0000 mg | Freq: Once | INTRAMUSCULAR | Status: AC
  Administered 2018-12-23: 40 mg via INTRAVENOUS

## 2018-12-23 NOTE — Progress Notes (Signed)
    Subjective  - POD #2  Pain is 2/10 afraid to walk   Physical Exam:  Dressing changed, incision without erythema, minimal drainage JP 35      Assessment/Plan:  POD #2  Spanish interpreter present for evaluation Encourage ambulation Needs DVT prophylaxis, will order SCD's OK for SQ heparin or levenox Hb down to 6.6 this am  Wells Jasmin Winberry 12/23/2018 2:27 PM --  Vitals:   12/23/18 0450 12/23/18 1148  BP: 106/79 105/79  Pulse: 97 87  Resp: 20   Temp: 99.6 F (37.6 C) 98.2 F (36.8 C)  SpO2: 97% (!) 81%    Intake/Output Summary (Last 24 hours) at 12/23/2018 1427 Last data filed at 12/23/2018 1300 Gross per 24 hour  Intake 590.33 ml  Output 20 ml  Net 570.33 ml     Laboratory CBC    Component Value Date/Time   WBC 5.8 12/23/2018 0446   HGB 6.6 (L) 12/23/2018 0446   HCT 20.5 (L) 12/23/2018 0446   PLT 83 (L) 12/23/2018 0446    BMET    Component Value Date/Time   NA 137 12/23/2018 0446   K 4.1 12/23/2018 0446   CL 100 12/23/2018 0446   CO2 27 12/23/2018 0446   GLUCOSE 98 12/23/2018 0446   BUN 37 (H) 12/23/2018 0446   CREATININE 7.52 (H) 12/23/2018 0446   CALCIUM 6.9 (L) 12/23/2018 0446   GFRNONAA 8 (L) 12/23/2018 0446   GFRAA 9 (L) 12/23/2018 0446    COAG Lab Results  Component Value Date   INR 1.2 12/22/2018   INR 1.1 12/18/2018   No results found for: PTT  Antibiotics Anti-infectives (From admission, onward)   Start     Dose/Rate Route Frequency Ordered Stop   12/22/18 1300  ceFAZolin (ANCEF) IVPB 1 g/50 mL premix    Note to Pharmacy: Send with pt to OR   1 g 100 mL/hr over 30 Minutes Intravenous Every 12 hours 12/22/18 1023 12/22/18 2353   12/22/18 1000  ceFAZolin (ANCEF) IVPB 1 g/50 mL premix  Status:  Discontinued    Note to Pharmacy: Send with pt to OR   1 g 100 mL/hr over 30 Minutes Intravenous Every 12 hours 12/22/18 0018 12/22/18 1023   12/22/18 0000  ceFAZolin (ANCEF) IVPB 1 g/50 mL premix    Note to Pharmacy: Send with  pt to OR   1 g 100 mL/hr over 30 Minutes Intravenous On call 12/21/18 1543 12/21/18 1645       V. Leia Alf, M.D., [facility] [place] Office: [phone] Pager:  [phone]

## 2018-12-23 NOTE — Progress Notes (Signed)
Patient ID: Ralph Lucero, male   DOB: Sep 08, 1975, 43 y.o.   MRN: [id number]  [facility] PROGRESS NOTE  Ralph Lucero [id number] DOB: [birth year] DOA: 12/17/2018 PCP: Anthonette Legato, MD  HPI/Subjective: Patient feeling fine.  He states that his thigh gives him some pain.  He is scared about bleeding at that site.  Objective: Vitals:   12/23/18 0450 12/23/18 1148  BP: 106/79 105/79  Pulse: 97 87  Resp: 20   Temp: 99.6 F (37.6 C) 98.2 F (36.8 C)  SpO2: 97% (!) 81%    Intake/Output Summary (Last 24 hours) at 12/23/2018 1412 Last data filed at 12/23/2018 0615 Gross per 24 hour  Intake 624.33 ml  Output 35 ml  Net 589.33 ml   Filed Weights   12/22/18 0900 12/22/18 0914 12/22/18 1230  Weight: 62.5 kg 62.5 kg 61.9 kg    ROS: Review of Systems  Constitutional: Negative for chills and fever.  Eyes: Negative for blurred vision.  Respiratory: Negative for cough and shortness of breath.   Cardiovascular: Negative for chest pain.  Gastrointestinal: Negative for abdominal pain, constipation, diarrhea, nausea and vomiting.  Genitourinary: Negative for dysuria.  Musculoskeletal: Negative for joint pain.  Neurological: Negative for dizziness and headaches.   Exam: Physical Exam  Constitutional: He is oriented to person, place, and time.  HENT:  Nose: No mucosal edema.  Mouth/Throat: No oropharyngeal exudate or posterior oropharyngeal edema.  Eyes: Pupils are equal, round, and reactive to light. Conjunctivae, EOM and lids are normal.  Neck: No JVD present. Carotid bruit is not present. No edema present. No thyroid mass and no thyromegaly present.  Cardiovascular: S1 normal and S2 normal. Exam reveals no gallop.  No murmur heard. Pulses:      Dorsalis pedis pulses are 2+ on the right side and 2+ on the left side.  Respiratory: No respiratory distress. He has no wheezes. He has no rhonchi. He has no rales.  GI: Soft. Bowel sounds are normal. There is no  abdominal tenderness.  Musculoskeletal:     Right ankle: He exhibits no swelling.     Left ankle: He exhibits no swelling.  Lymphadenopathy:    He has no cervical adenopathy.  Neurological: He is alert and oriented to person, place, and time. No cranial nerve deficit.  Skin: Skin is warm. No rash noted. Nails show no clubbing.  Right groin covered  Psychiatric: He has a normal mood and affect.      Data Reviewed: Basic Metabolic Panel: Recent Labs  Lab 12/17/18 2348  12/19/18 0920 12/19/18 1433 12/20/18 0432 12/21/18 0616 12/21/18 1455 12/22/18 0358 12/23/18 0446  NA 143  --  138  --  139 138  --  133* 137  K 6.8*   < > 6.0* 3.4* 4.3 4.4  --  5.1 4.1  CL 100  --  97*  --  100 100  --  98 100  CO2 24  --  26  --  28 27  --  25 27  GLUCOSE 124*  --  112*  --  119* 141* 117* 140* 98  BUN 94*  --  59*  --  34* 25*  --  38* 37*  CREATININE 16.61*  --  13.03*  --  9.07* 6.86*  --  8.97* 7.52*  CALCIUM 8.2*  --  7.4*  --  7.5* 7.7*  --  6.4* 6.9*  MG 3.0*  --   --   --   --   --   --  1.8  --   PHOS  --   --  4.3  --  4.5  --   --  4.9*  --    < > = values in this interval not displayed.   Liver Function Tests: Recent Labs  Lab 12/17/18 2348 12/19/18 0920 12/20/18 0432 12/22/18 0358  AST 23  --   --  34  ALT 30  --   --  16  ALKPHOS 111  --   --  59  BILITOT 0.8  --   --  0.4  PROT 8.0  --   --  5.2*  ALBUMIN 4.5 3.4* 3.3* 2.8*   CBC: Recent Labs  Lab 12/17/18 2348 12/18/18 0648  12/20/18 0100  12/20/18 1737 12/20/18 2356 12/21/18 0616 12/21/18 1846 12/22/18 0358 12/23/18 0446  WBC 9.6 5.5  --  7.1   < > 5.6  --  7.1 8.3 6.5 5.8  NEUTROABS 7.7 4.2  --  5.1  --   --   --  5.2  --   --   --   HGB 11.1* 9.8*   < > 7.0*   < > 8.0* 8.4* 8.1* 5.3* 7.3* 6.6*  HCT 34.9* 30.4*   < > 21.2*   < > 24.3* 25.6* 24.7* 16.4* 22.2* 20.5*  MCV 97.5 96.8  --  95.5   < > 95.3  --  96.5 97.6 89.2 91.1  PLT 140* 142*  --  90*   < > 70*  --  69* 83* 64* 83*   < > = values in  this interval not displayed.   BNP (last 3 results) Recent Labs    12/17/18 2348  BNP 1,555.0*     CBG: Recent Labs  Lab 12/22/18 0211 12/22/18 0529 12/22/18 0743 12/23/18 0850 12/23/18 1221  GLUCAP 128* 129* 96 53* 67*    Recent Results (from the past 240 hour(s))  SARS Coronavirus 2 [facility] order, Performed in [facility] hospital lab) Nasopharyngeal Nasopharyngeal Swab     Status: None   Collection Time: 12/18/18 12:31 AM   Specimen: Nasopharyngeal Swab  Result Value Ref Range Status   SARS Coronavirus 2 NEGATIVE NEGATIVE Final    Comment: (NOTE) If result is NEGATIVE SARS-CoV-2 target nucleic acids are NOT DETECTED. The SARS-CoV-2 RNA is generally detectable in upper and lower  respiratory specimens during the acute phase of infection. The lowest  concentration of SARS-CoV-2 viral copies this assay can detect is 250  copies / mL. A negative result does not preclude SARS-CoV-2 infection  and should not be used as the sole basis for treatment or other  patient management decisions.  A negative result may occur with  improper specimen collection / handling, submission of specimen other  than nasopharyngeal swab, presence of viral mutation(s) within the  areas targeted by this assay, and inadequate number of viral copies  (<250 copies / mL). A negative result must be combined with clinical  observations, patient history, and epidemiological information. If result is POSITIVE SARS-CoV-2 target nucleic acids are DETECTED. The SARS-CoV-2 RNA is generally detectable in upper and lower  respiratory specimens dur ing the acute phase of infection.  Positive  results are indicative of active infection with SARS-CoV-2.  Clinical  correlation with patient history and other diagnostic information is  necessary to determine patient infection status.  Positive results do  not rule out bacterial infection or co-infection with other viruses. If result is PRESUMPTIVE  POSTIVE SARS-CoV-2 nucleic acids MAY BE PRESENT.  A presumptive positive result was obtained on the submitted specimen  and confirmed on repeat testing.  While 2019 novel coronavirus  (SARS-CoV-2) nucleic acids may be present in the submitted sample  additional confirmatory testing may be necessary for epidemiological  and / or clinical management purposes  to differentiate between  SARS-CoV-2 and other Sarbecovirus currently known to infect humans.  If clinically indicated additional testing with an alternate test  methodology [phone]) is advised. The SARS-CoV-2 RNA is generally  detectable in upper and lower respiratory sp ecimens during the acute  phase of infection. The expected result is Negative. Fact Sheet for Patients:  StrictlyIdeas.no Fact Sheet for Healthcare Providers: BankingDealers.co.za This test is not yet approved or cleared by the [place] FDA and has been authorized for detection and/or diagnosis of SARS-CoV-2 by FDA under an Emergency Use Authorization (EUA).  This EUA will remain in effect (meaning this test can be used) for the duration of the COVID-19 declaration under Section 564(b)(1) of the Act, 21 U.S.C. section 360bbb-3(b)(1), unless the authorization is terminated or revoked sooner. Performed at [facility], [address] [place], [place] [address]   MRSA PCR Screening     Status: None   Collection Time: 12/18/18  2:32 PM   Specimen: Nasal Mucosa; Nasopharyngeal  Result Value Ref Range Status   MRSA by PCR NEGATIVE NEGATIVE Final    Comment:        The GeneXpert MRSA Assay (FDA approved for NASAL specimens only), is one component of a comprehensive MRSA colonization surveillance program. It is not intended to diagnose MRSA infection nor to guide or monitor treatment for MRSA infections. Performed at [facility], [address] [place], [place] [address]       Studies: Ct Angio Abd/pel W/ And/or W/o  Result Date: 12/21/2018 CLINICAL DATA:  43 year old male with a history of right thigh hematoma EXAM: CTA ABDOMEN AND PELVIS wITHOUT AND WITH CONTRAST TECHNIQUE: Multidetector CT imaging of the abdomen and pelvis was performed using the standard protocol during bolus administration of intravenous contrast. Multiplanar reconstructed images and MIPs were obtained and reviewed to evaluate the vascular anatomy. CONTRAST:  127mL OMNIPAQUE IOHEXOL 350 MG/ML SOLN COMPARISON:  CT December 19, 2018 FINDINGS: VASCULAR Aorta: Unremarkable course, caliber, contour of the abdominal aorta. No dissection, aneurysm, or periaortic fluid. Celiac: Unremarkable celiac artery. SMA: No significant atherosclerotic changes of the superior mesenteric artery. Renals: Small caliber bilateral renal arteries. IMA: Inferior mesenteric artery is patent. Left colic artery patent. Superior rectal artery patent. Right lower extremity: Right iliac artery without significant atherosclerotic changes. Hypogastric arteries patent. External iliac artery patent. Common femoral artery is unremarkable with unremarkable bifurcation. There is patent circumflex lateral iliac artery, lateral circumflex femoral artery, inferior epigastric artery, and the profunda femoris. No significant atherosclerotic changes of the SFA or profunda femoris. There is extraluminal contrast within the right thigh musculature extending irregular linear fashion laterally and anteriorly. This is best seen on image 148 of series 4. The contrast is continuous with the right aspect of the proximal SFA on image 153 and 154 of series 4. There is also early opacification of the associated femoral vein, with contrast extending through the proximal femoral vein, common femoral vein, and iliac system up to the IVC. This represents a fistula at this site. Distal SFA and profunda femoris branches remain patent with no dissection or narrowing. Left  lower extremity: Unremarkable course, caliber, and contour of the left iliac system. No aneurysm, dissection, or occlusion. Hypogastric  artery is patent. Anterior and posterior division patent. Common femoral artery patent. Proximal SFA and profunda femoris patent. Veins: Early opacification of the right proximal femoral vein at the site of the presumed arterial injury representing a fistula. Contrast extends through the proximal femoral vein, common femoral vein, iliac vein to the IVC. Left common femoral vein hemodialysis catheter. Review of the MIP images confirms the above findings. NON-VASCULAR Lower chest: No acute. Hepatobiliary: Unremarkable appearance of the liver. Unremarkable gall bladder. Pancreas: Unremarkable pancreas Spleen: Unremarkable Adrenals/Urinary Tract: Unremarkable appearance of adrenal glands. Right: Atrophic right kidney with multiple low-density lesions, poorly characterized on this early phase CT. No hydronephrosis or nephrolithiasis. Left: Atrophic left kidney with multiple low-density lesions, poorly characterized on this early phase CT. There is a single subcentimeter hyperdense lesion measuring 5 mm at the inferior pole. No hydronephrosis or nephrolithiasis. Contracted urinary bladder. Stomach/Bowel: Unremarkable appearance of the stomach. Unremarkable appearance of small bowel. No evidence of obstruction. No colonic diverticula. Normal appendix. Lymphatic: No lymphadenopathy Mesenteric: No free fluid or air. No adenopathy. Reproductive: Unremarkable appearance of the pelvic organs. Other: Fluid tracks along the superficial soft tissues of the right proximal thigh and right lateral pelvis overlying the lateral wall musculature. Redemonstration of proximal right thigh hematoma. The greatest estimated diameter anteriorly is 13.6 cm. There is no direct comparison at this site given the limits of the prior CT. M circumferential edema of the right thigh. Musculoskeletal: No acute displaced  fracture. IMPRESSION: CT angiogram demonstrates evidence of both a pseudoaneurysm and arteriovenous fistula with the most likely site from either the proximal right superficial femoral artery or a small muscular branch of either the proximal SFA or profunda femoris. There is evidence of extravasation of contrast from the site of arterial injury in the proximal thigh, and associated hematoma. The above preliminary results were discussed by telephone at the time of interpretation on 12/21/2018 at 3:22 pm with Dr. Hortencia Pilar. Greatest estimated diameter of the proximal thigh hematoma measures 13.6 cm. No direct comparison available. Fluid tracks along the superficial right lateral pelvic/abdominal wall musculature. Atrophic kidneys with multiple small hypodense lesions, poorly characterized on this single phase CT. If a more precise evaluation of these lesions is warranted, consider MRI. Ancillary findings as above. Electronically Signed   By: Corrie Mckusick D.O.   On: 12/21/2018 15:24    Scheduled Meds: . sodium chloride   Intravenous Once  . acetaminophen  650 mg Oral Once  . Chlorhexidine Gluconate Cloth  6 each Topical Q0600  . docusate sodium  100 mg Oral BID  . epoetin (EPOGEN/PROCRIT) injection  10,000 Units Intravenous Q T,Th,Sa-HD  . feeding supplement (NEPRO CARB STEADY)  237 mL Oral BID BM  . furosemide  40 mg Intravenous Once  . influenza vac split quadrivalent PF  0.5 mL Intramuscular Tomorrow-1000  . multivitamin  1 tablet Oral QHS   Continuous Infusions:   Assessment/Plan:  1. Right thigh hematoma, right superficial femoral artery pseudoaneurysm.  Patient went to the operating for urgent repair.  Patient has pain when he gets up 2. Acute hypoxic respiratory failure.  Patient now on room air. 3. End-stage renal disease with hyperkalemia.  Patient receiving dialysis yesterday through temporary catheter. 4. Dialysis access problem with the left fistula.  Will need a angiogram at  some point. 5. CAD with elevated troponin.  STEMI in May.  As per cardiologist note supposed to be on aspirin and Brilinta.  At this point will need clearance from vascular surgery before starting  these medications. 6. Acute blood loss anemia with hemoglobin down to 6.6 today.  Transfuse 1 more unit of packed red blood cells today. 7. Patient requested transfer to [facility].  I spoke with the transfer center and medicine team.  They do not have a bed at this time for this patient.  Code Status:     Code Status Orders  (From admission, onward)         Start     Ordered   12/18/18 0441  Full code  Continuous     12/18/18 0440        Code Status History    This patient has a current code status but no historical code status.   Advance Care Planning Activity     Family Communication: Spoke with family on the phone Disposition Plan: To be determined  Consultants:  Critical care specialist  Vascular surgery  Nephrology  Cardiology  Time spent: 35 minutes, through interpreter provided by the hospital.  [facility]

## 2018-12-23 NOTE — Progress Notes (Signed)
[facility]  ROUNDING NOTE   Subjective:   Hemodialysis treatment yesterday. Tolerated treatment well.   History taken with assistance of Spanish Interpreter  Status post repair of pseudoaneurysm and evacuation of hematoma. Status pain is better controlled this morning.    Hemoglobin 6.6 - PRBC transfusion ordered.   Objective:  Vital signs in last 24 hours:  Temp:  [98.2 F (36.8 C)-99.6 F (37.6 C)] 98.2 F (36.8 C) (09/13 1148) Pulse Rate:  [73-104] 87 (09/13 1148) Resp:  [14-20] 20 (09/13 0450) BP: (97-114)/(70-84) 105/79 (09/13 1148) SpO2:  [81 %-100 %] 81 % (09/13 1148) Weight:  [61.9 kg] 61.9 kg (09/12 1230)  Weight change:  Filed Weights   12/22/18 0900 12/22/18 0914 12/22/18 1230  Weight: 62.5 kg 62.5 kg 61.9 kg    Intake/Output: I/O last 3 completed shifts: In: 2019.8 [P.O.:240; I.V.:475.5; Blood:920; NG/GT:274; IV Piggyback:110.3] Out: 957 [Drains:75; Other:882]   Intake/Output this shift:  No intake/output data recorded.  Physical Exam: General: Laying in bed  Head: Normocephalic, atraumatic. Moist oral mucosal membranes  Eyes: Anicteric, PERRL, left +pterygium  Neck: Supple, trachea midline  Lungs:  clear  Heart: Regular rate and rhythm  Abdomen:  Soft, nontender,   Extremities: no peripheral edema. Right thigh dressings with drain  Neurologic: sedated  Skin: No lesions  Access: Left AVF + thrill, +bruit,  Left femoral temp HD catheter Dr. Delana Meyer 9/8    Basic Metabolic Panel: Recent Labs  Lab 12/17/18 2348  12/19/18 0920 12/19/18 1433 12/20/18 0432 12/21/18 0616 12/21/18 1455 12/22/18 0358 12/23/18 0446  NA 143  --  138  --  139 138  --  133* 137  K 6.8*   < > 6.0* 3.4* 4.3 4.4  --  5.1 4.1  CL 100  --  97*  --  100 100  --  98 100  CO2 24  --  26  --  28 27  --  25 27  GLUCOSE 124*  --  112*  --  119* 141* 117* 140* 98  BUN 94*  --  59*  --  34* 25*  --  38* 37*  CREATININE 16.61*  --  13.03*  --  9.07* 6.86*  --   8.97* 7.52*  CALCIUM 8.2*  --  7.4*  --  7.5* 7.7*  --  6.4* 6.9*  MG 3.0*  --   --   --   --   --   --  1.8  --   PHOS  --   --  4.3  --  4.5  --   --  4.9*  --    < > = values in this interval not displayed.    Liver Function Tests: Recent Labs  Lab 12/17/18 2348 12/19/18 0920 12/20/18 0432 12/22/18 0358  AST 23  --   --  34  ALT 30  --   --  16  ALKPHOS 111  --   --  59  BILITOT 0.8  --   --  0.4  PROT 8.0  --   --  5.2*  ALBUMIN 4.5 3.4* 3.3* 2.8*   No results for input(s): LIPASE, AMYLASE in the last 168 hours. No results for input(s): AMMONIA in the last 168 hours.  CBC: Recent Labs  Lab 12/17/18 2348 12/18/18 [id number]  12/20/18 0100  12/20/18 1737 12/20/18 2356 12/21/18 0616 12/21/18 1846 12/22/18 0358 12/23/18 0446  WBC 9.6 5.5  --  7.1   < > 5.6  --  7.1 8.3 6.5 5.8  NEUTROABS 7.7 4.2  --  5.1  --   --   --  5.2  --   --   --   HGB 11.1* 9.8*   < > 7.0*   < > 8.0* 8.4* 8.1* 5.3* 7.3* 6.6*  HCT 34.9* 30.4*   < > 21.2*   < > 24.3* 25.6* 24.7* 16.4* 22.2* 20.5*  MCV 97.5 96.8  --  95.5   < > 95.3  --  96.5 97.6 89.2 91.1  PLT 140* 142*  --  90*   < > 70*  --  69* 83* 64* 83*   < > = values in this interval not displayed.    Cardiac Enzymes: No results for input(s): CKTOTAL, CKMB, CKMBINDEX, TROPONINI in the last 168 hours.  BNP: Invalid input(s): POCBNP  CBG: Recent Labs  Lab 12/22/18 0030 12/22/18 0211 12/22/18 0529 12/22/18 0743 12/23/18 0850  GLUCAP 126* 128* 129* 96 76*    Microbiology: Results for orders placed or performed during the hospital encounter of 12/17/18  SARS Coronavirus 2 [facility] order, Performed in [facility] hospital lab) Nasopharyngeal Nasopharyngeal Swab     Status: None   Collection Time: 12/18/18 12:31 AM   Specimen: Nasopharyngeal Swab  Result Value Ref Range Status   SARS Coronavirus 2 NEGATIVE NEGATIVE Final    Comment: (NOTE) If result is NEGATIVE SARS-CoV-2 target nucleic acids are NOT DETECTED. The SARS-CoV-2  RNA is generally detectable in upper and lower  respiratory specimens during the acute phase of infection. The lowest  concentration of SARS-CoV-2 viral copies this assay can detect is 250  copies / mL. A negative result does not preclude SARS-CoV-2 infection  and should not be used as the sole basis for treatment or other  patient management decisions.  A negative result may occur with  improper specimen collection / handling, submission of specimen other  than nasopharyngeal swab, presence of viral mutation(s) within the  areas targeted by this assay, and inadequate number of viral copies  (<250 copies / mL). A negative result must be combined with clinical  observations, patient history, and epidemiological information. If result is POSITIVE SARS-CoV-2 target nucleic acids are DETECTED. The SARS-CoV-2 RNA is generally detectable in upper and lower  respiratory specimens dur ing the acute phase of infection.  Positive  results are indicative of active infection with SARS-CoV-2.  Clinical  correlation with patient history and other diagnostic information is  necessary to determine patient infection status.  Positive results do  not rule out bacterial infection or co-infection with other viruses. If result is PRESUMPTIVE POSTIVE SARS-CoV-2 nucleic acids MAY BE PRESENT.   A presumptive positive result was obtained on the submitted specimen  and confirmed on repeat testing.  While 2019 novel coronavirus  (SARS-CoV-2) nucleic acids may be present in the submitted sample  additional confirmatory testing may be necessary for epidemiological  and / or clinical management purposes  to differentiate between  SARS-CoV-2 and other Sarbecovirus currently known to infect humans.  If clinically indicated additional testing with an alternate test  methodology [phone]) is advised. The SARS-CoV-2 RNA is generally  detectable in upper and lower respiratory sp ecimens during the acute  phase of  infection. The expected result is Negative. Fact Sheet for Patients:  StrictlyIdeas.no Fact Sheet for Healthcare Providers: BankingDealers.co.za This test is not yet approved or cleared by the [place] FDA and has been authorized for detection and/or diagnosis of SARS-CoV-2 by FDA under an Emergency  Use Authorization (EUA).  This EUA will remain in effect (meaning this test can be used) for the duration of the COVID-19 declaration under Section 564(b)(1) of the Act, 21 U.S.C. section 360bbb-3(b)(1), unless the authorization is terminated or revoked sooner. Performed at [facility], [address] [place], [place] [address]   MRSA PCR Screening     Status: None   Collection Time: 12/18/18  2:32 PM   Specimen: Nasal Mucosa; Nasopharyngeal  Result Value Ref Range Status   MRSA by PCR NEGATIVE NEGATIVE Final    Comment:        The GeneXpert MRSA Assay (FDA approved for NASAL specimens only), is one component of a comprehensive MRSA colonization surveillance program. It is not intended to diagnose MRSA infection nor to guide or monitor treatment for MRSA infections. Performed at [facility], [address] [place], [place] [address]     Coagulation Studies: Recent Labs    12/22/18 0358  LABPROT 14.6  INR 1.2    Urinalysis: No results for input(s): COLORURINE, LABSPEC, PHURINE, GLUCOSEU, HGBUR, BILIRUBINUR, KETONESUR, PROTEINUR, UROBILINOGEN, NITRITE, LEUKOCYTESUR in the last 72 hours.  Invalid input(s): APPERANCEUR    Imaging: Ct Angio Abd/pel W/ And/or W/o  Result Date: 12/21/2018 CLINICAL DATA:  43 year old male with a history of right thigh hematoma EXAM: CTA ABDOMEN AND PELVIS wITHOUT AND WITH CONTRAST TECHNIQUE: Multidetector CT imaging of the abdomen and pelvis was performed using the standard protocol during bolus administration of intravenous contrast. Multiplanar reconstructed images and  MIPs were obtained and reviewed to evaluate the vascular anatomy. CONTRAST:  129mL OMNIPAQUE IOHEXOL 350 MG/ML SOLN COMPARISON:  CT December 19, 2018 FINDINGS: VASCULAR Aorta: Unremarkable course, caliber, contour of the abdominal aorta. No dissection, aneurysm, or periaortic fluid. Celiac: Unremarkable celiac artery. SMA: No significant atherosclerotic changes of the superior mesenteric artery. Renals: Small caliber bilateral renal arteries. IMA: Inferior mesenteric artery is patent. Left colic artery patent. Superior rectal artery patent. Right lower extremity: Right iliac artery without significant atherosclerotic changes. Hypogastric arteries patent. External iliac artery patent. Common femoral artery is unremarkable with unremarkable bifurcation. There is patent circumflex lateral iliac artery, lateral circumflex femoral artery, inferior epigastric artery, and the profunda femoris. No significant atherosclerotic changes of the SFA or profunda femoris. There is extraluminal contrast within the right thigh musculature extending irregular linear fashion laterally and anteriorly. This is best seen on image 148 of series 4. The contrast is continuous with the right aspect of the proximal SFA on image 153 and 154 of series 4. There is also early opacification of the associated femoral vein, with contrast extending through the proximal femoral vein, common femoral vein, and iliac system up to the IVC. This represents a fistula at this site. Distal SFA and profunda femoris branches remain patent with no dissection or narrowing. Left lower extremity: Unremarkable course, caliber, and contour of the left iliac system. No aneurysm, dissection, or occlusion. Hypogastric artery is patent. Anterior and posterior division patent. Common femoral artery patent. Proximal SFA and profunda femoris patent. Veins: Early opacification of the right proximal femoral vein at the site of the presumed arterial injury representing a  fistula. Contrast extends through the proximal femoral vein, common femoral vein, iliac vein to the IVC. Left common femoral vein hemodialysis catheter. Review of the MIP images confirms the above findings. NON-VASCULAR Lower chest: No acute. Hepatobiliary: Unremarkable appearance of the liver. Unremarkable gall bladder. Pancreas: Unremarkable pancreas Spleen: Unremarkable Adrenals/Urinary Tract: Unremarkable appearance of adrenal glands. Right: Atrophic right kidney  with multiple low-density lesions, poorly characterized on this early phase CT. No hydronephrosis or nephrolithiasis. Left: Atrophic left kidney with multiple low-density lesions, poorly characterized on this early phase CT. There is a single subcentimeter hyperdense lesion measuring 5 mm at the inferior pole. No hydronephrosis or nephrolithiasis. Contracted urinary bladder. Stomach/Bowel: Unremarkable appearance of the stomach. Unremarkable appearance of small bowel. No evidence of obstruction. No colonic diverticula. Normal appendix. Lymphatic: No lymphadenopathy Mesenteric: No free fluid or air. No adenopathy. Reproductive: Unremarkable appearance of the pelvic organs. Other: Fluid tracks along the superficial soft tissues of the right proximal thigh and right lateral pelvis overlying the lateral wall musculature. Redemonstration of proximal right thigh hematoma. The greatest estimated diameter anteriorly is 13.6 cm. There is no direct comparison at this site given the limits of the prior CT. M circumferential edema of the right thigh. Musculoskeletal: No acute displaced fracture. IMPRESSION: CT angiogram demonstrates evidence of both a pseudoaneurysm and arteriovenous fistula with the most likely site from either the proximal right superficial femoral artery or a small muscular branch of either the proximal SFA or profunda femoris. There is evidence of extravasation of contrast from the site of arterial injury in the proximal thigh, and associated  hematoma. The above preliminary results were discussed by telephone at the time of interpretation on 12/21/2018 at 3:22 pm with Dr. Hortencia Pilar. Greatest estimated diameter of the proximal thigh hematoma measures 13.6 cm. No direct comparison available. Fluid tracks along the superficial right lateral pelvic/abdominal wall musculature. Atrophic kidneys with multiple small hypodense lesions, poorly characterized on this single phase CT. If a more precise evaluation of these lesions is warranted, consider MRI. Ancillary findings as above. Electronically Signed   By: Corrie Mckusick D.O.   On: 12/21/2018 15:24     Medications:    . sodium chloride   Intravenous Once  . acetaminophen  650 mg Oral Once  . Chlorhexidine Gluconate Cloth  6 each Topical Q0600  . docusate sodium  100 mg Oral BID  . epoetin (EPOGEN/PROCRIT) injection  10,000 Units Intravenous Q T,Th,Sa-HD  . feeding supplement (NEPRO CARB STEADY)  237 mL Oral BID BM  . furosemide  40 mg Intravenous Once  . influenza vac split quadrivalent PF  0.5 mL Intramuscular Tomorrow-1000  . multivitamin  1 tablet Oral QHS   acetaminophen **OR** [DISCONTINUED] acetaminophen, calcium carbonate, heparin, HYDROcodone-acetaminophen, LORazepam, morphine injection, [DISCONTINUED] ondansetron **OR** ondansetron (ZOFRAN) IV  Assessment/ Plan:  Mr. Ralph Lucero is a 43 y.o. Hispanic Spanish Speaking only male with end stage renal disease on hemodialysis followed by [facility], hypertension, coronary artery disease, secondary hyperparathyroidism status post parathyroidectomy who presents to [facility] with hyperkalemia and pulmonary edema.   [facility] Nephrology TTS [place] Left AVF  1. End Stage Renal Disease: hemodialysis treatment yesterday.  - Appreciate vascular input. Will need AVF functioning before discharge.   2. Hypertension: history of difficult to control. Hypertensive urgency on admission.  Home regimen of metoprolol,  lisinopril, isosorbide mononitrate and felodipine.  Currently holding home bp meds.   3. Anemia of chronic kidney disease: with blood loss Secondary to hematoma. Status post PRBC transfusion on 9/9, 9/10 and scheduled for today.  - EPO with HD treatment TTS   4. Secondary Hyperparathyroidism status post parathyroidectomy  - calcium acetate with meals.    5. Diabetes mellitus type II with chronic kidney disease  6. Right thigh hematoma: status post repair on pseudoanuerysm and evacuation of hematoma by Dr. Delana Meyer on 9/11.  LOS: 5 Niyati Heinke 9/13/202012:09 PM

## 2018-12-23 NOTE — Plan of Care (Addendum)
CBG 57 this AM. Tray delivered and also given OJ  CBG for lunch 65. Tray delivered and also gave OJ  CBG for dinner 65. Tray and OJ given

## 2018-12-24 ENCOUNTER — Other Ambulatory Visit: Payer: Self-pay

## 2018-12-24 ENCOUNTER — Other Ambulatory Visit (INDEPENDENT_AMBULATORY_CARE_PROVIDER_SITE_OTHER): Payer: Self-pay | Admitting: Vascular Surgery

## 2018-12-24 ENCOUNTER — Inpatient Hospital Stay: Payer: Medicaid Other

## 2018-12-24 DIAGNOSIS — N186 End stage renal disease: Secondary | ICD-10-CM

## 2018-12-24 DIAGNOSIS — T82868A Thrombosis of vascular prosthetic devices, implants and grafts, initial encounter: Secondary | ICD-10-CM

## 2018-12-24 DIAGNOSIS — Z992 Dependence on renal dialysis: Secondary | ICD-10-CM

## 2018-12-24 LAB — CBC
HCT: 26.7 % — ABNORMAL LOW (ref 39.0–52.0)
Hemoglobin: 8.4 g/dL — ABNORMAL LOW (ref 13.0–17.0)
MCH: 30.1 pg (ref 26.0–34.0)
MCHC: 31.5 g/dL (ref 30.0–36.0)
MCV: 95.7 fL (ref 80.0–100.0)
Platelets: UNDETERMINED 10*3/uL (ref 150–400)
RBC: 2.79 MIL/uL — ABNORMAL LOW (ref 4.22–5.81)
RDW: 17 % — ABNORMAL HIGH (ref 11.5–15.5)
WBC: 7.9 10*3/uL (ref 4.0–10.5)
nRBC: 0.3 % — ABNORMAL HIGH (ref 0.0–0.2)

## 2018-12-24 LAB — TYPE AND SCREEN
ABO/RH(D): A POS
Antibody Screen: NEGATIVE
Unit division: 0

## 2018-12-24 LAB — BASIC METABOLIC PANEL
Anion gap: 17 — ABNORMAL HIGH (ref 5–15)
BUN: 61 mg/dL — ABNORMAL HIGH (ref 6–20)
CO2: 20 mmol/L — ABNORMAL LOW (ref 22–32)
Calcium: 6.8 mg/dL — ABNORMAL LOW (ref 8.9–10.3)
Chloride: 96 mmol/L — ABNORMAL LOW (ref 98–111)
Creatinine, Ser: 10.39 mg/dL — ABNORMAL HIGH (ref 0.61–1.24)
GFR calc Af Amer: 6 mL/min — ABNORMAL LOW (ref >60)
GFR calc non Af Amer: 5 mL/min — ABNORMAL LOW (ref >60)
Glucose, Bld: 95 mg/dL (ref 70–99)
Potassium: 5 mmol/L (ref 3.5–5.1)
Sodium: 133 mmol/L — ABNORMAL LOW (ref 135–145)

## 2018-12-24 LAB — BPAM RBC
Blood Product Expiration Date: 202010172359
ISSUE DATE / TIME: 202009131838
Unit Type and Rh: 6200

## 2018-12-24 LAB — GLUCOSE, CAPILLARY: Glucose-Capillary: 83 mg/dL (ref 70–99)

## 2018-12-24 MED ORDER — VANCOMYCIN VARIABLE DOSE PER UNSTABLE RENAL FUNCTION (PHARMACIST DOSING): Status: DC

## 2018-12-24 MED ORDER — ISOSORBIDE MONONITRATE ER 30 MG PO TB24
30.0000 mg | ORAL_TABLET | Freq: Every day | ORAL | Status: DC
  Administered 2018-12-25 – 2018-12-26 (×2): 30 mg via ORAL
  Filled 2018-12-24 (×2): qty 1

## 2018-12-24 MED ORDER — FUROSEMIDE 10 MG/ML IJ SOLN
80.0000 mg | Freq: Once | INTRAMUSCULAR | Status: AC
  Administered 2018-12-24: 80 mg via INTRAVENOUS
  Filled 2018-12-24: qty 8

## 2018-12-24 MED ORDER — PIPERACILLIN-TAZOBACTAM 3.375 G IVPB
3.3750 g | Freq: Two times a day (BID) | INTRAVENOUS | Status: DC
  Administered 2018-12-25 – 2018-12-27 (×5): 3.375 g via INTRAVENOUS
  Filled 2018-12-24 (×5): qty 50

## 2018-12-24 MED ORDER — ALPRAZOLAM 0.25 MG PO TABS
0.2500 mg | ORAL_TABLET | Freq: Once | ORAL | Status: AC
  Administered 2018-12-24: 0.25 mg via ORAL
  Filled 2018-12-24: qty 1

## 2018-12-24 MED ORDER — VANCOMYCIN HCL 1.5 G IV SOLR
1500.0000 mg | Freq: Once | INTRAVENOUS | Status: AC
  Administered 2018-12-24: 1500 mg via INTRAVENOUS
  Filled 2018-12-24: qty 1500

## 2018-12-24 MED ORDER — TICAGRELOR 90 MG PO TABS
90.0000 mg | ORAL_TABLET | Freq: Two times a day (BID) | ORAL | Status: DC
  Administered 2018-12-24 – 2018-12-27 (×7): 90 mg via ORAL
  Filled 2018-12-24 (×8): qty 1

## 2018-12-24 MED ORDER — ASPIRIN EC 81 MG PO TBEC
81.0000 mg | DELAYED_RELEASE_TABLET | Freq: Every day | ORAL | Status: DC
  Administered 2018-12-24 – 2018-12-27 (×4): 81 mg via ORAL
  Filled 2018-12-24 (×4): qty 1

## 2018-12-24 MED ORDER — METOPROLOL TARTRATE 25 MG PO TABS
25.0000 mg | ORAL_TABLET | Freq: Two times a day (BID) | ORAL | Status: DC
  Administered 2018-12-24 – 2018-12-26 (×5): 25 mg via ORAL
  Filled 2018-12-24 (×5): qty 1

## 2018-12-24 NOTE — Progress Notes (Signed)
[facility], [place] 12/24/18  Subjective:   LOS: 6 09/13 0701 - 09/14 0700 In: 1160.2 [P.O.:240; I.V.:280.2; Blood:640] Out: 6 [Drains:65]  Paged by nurse for urgent evaluation as patient is having increasing SOB and chest pain. EKG and Chest XR ordered by primary team Urgent HD requested Patient has fever of [place] interpreter in the room   Objective:  Vital signs in last 24 hours:  Temp:  [98.6 F (37 C)-101 F (38.3 C)] 101 F (38.3 C) (09/14 1421) Pulse Rate:  [88-115] 115 (09/14 1421) Resp:  [16-20] 17 (09/14 1421) BP: (102-138)/(80-103) 138/103 (09/14 1421) SpO2:  [95 %-98 %] 98 % (09/14 1421)  Weight change:  Filed Weights   12/22/18 0900 12/22/18 0914 12/22/18 1230  Weight: 62.5 kg 62.5 kg 61.9 kg    Intake/Output:    Intake/Output Summary (Last 24 hours) at 12/24/2018 1451 Last data filed at 12/24/2018 0700 Gross per 24 hour  Intake 920.17 ml  Output 65 ml  Net 855.17 ml     Physical Exam: General: Anxious appearing  HEENT Anicteric. Moist oral mucus membranes  Neck supple  Pulm/lungs B/l basilar crackles  CVS/Heart Tachycardic, regular  Abdomen:  Soft, NT  Extremities: Trace edema  Neurologic: Alert. Able to follow commands  Skin: warm  Access: Left fem temp cath, left arm aneurysmal AVF       Basic Metabolic Panel:  Recent Labs  Lab 12/17/18 2348  12/19/18 0920  12/20/18 0432 12/21/18 0616 12/21/18 1455 12/22/18 0358 12/23/18 0446 12/24/18 0817  NA 143  --  138  --  139 138  --  133* 137 133*  K 6.8*   < > 6.0*   < > 4.3 4.4  --  5.1 4.1 5.0  CL 100  --  97*  --  100 100  --  98 100 96*  CO2 24  --  26  --  28 27  --  25 27 20*  GLUCOSE 124*  --  112*  --  119* 141* 117* 140* 98 95  BUN 94*  --  59*  --  34* 25*  --  38* 37* 61*  CREATININE 16.61*  --  13.03*  --  9.07* 6.86*  --  8.97* 7.52* 10.39*  CALCIUM 8.2*  --  7.4*  --  7.5* 7.7*  --  6.4* 6.9* 6.8*  MG 3.0*  --   --   --    --   --   --  1.8  --   --   PHOS  --   --  4.3  --  4.5  --   --  4.9*  --   --    < > = values in this interval not displayed.     CBC: Recent Labs  Lab 12/17/18 2348 12/18/18 0648  12/20/18 0100  12/21/18 0616 12/21/18 1846 12/22/18 0358 12/23/18 0446 12/24/18 0817  WBC 9.6 5.5  --  7.1   < > 7.1 8.3 6.5 5.8 7.9  NEUTROABS 7.7 4.2  --  5.1  --  5.2  --   --   --   --   HGB 11.1* 9.8*   < > 7.0*   < > 8.1* 5.3* 7.3* 6.6* 8.4*  HCT 34.9* 30.4*   < > 21.2*   < > 24.7* 16.4* 22.2* 20.5* 26.7*  MCV 97.5 96.8  --  95.5   < > 96.5 97.6 89.2 91.1 95.7  PLT 140*  142*  --  90*   < > 69* 83* 64* 83* PLATELET CLUMPS NOTED ON SMEAR, UNABLE TO ESTIMATE   < > = values in this interval not displayed.      Lab Results  Component Value Date   HEPBSAG Negative 12/18/2018      Microbiology:  Recent Results (from the past 240 hour(s))  SARS Coronavirus 2 [facility] order, Performed in [facility] hospital lab) Nasopharyngeal Nasopharyngeal Swab     Status: None   Collection Time: 12/18/18 12:31 AM   Specimen: Nasopharyngeal Swab  Result Value Ref Range Status   SARS Coronavirus 2 NEGATIVE NEGATIVE Final    Comment: (NOTE) If result is NEGATIVE SARS-CoV-2 target nucleic acids are NOT DETECTED. The SARS-CoV-2 RNA is generally detectable in upper and lower  respiratory specimens during the acute phase of infection. The lowest  concentration of SARS-CoV-2 viral copies this assay can detect is 250  copies / mL. A negative result does not preclude SARS-CoV-2 infection  and should not be used as the sole basis for treatment or other  patient management decisions.  A negative result may occur with  improper specimen collection / handling, submission of specimen other  than nasopharyngeal swab, presence of viral mutation(s) within the  areas targeted by this assay, and inadequate number of viral copies  (<250 copies / mL). A negative result must be combined with clinical  observations,  patient history, and epidemiological information. If result is POSITIVE SARS-CoV-2 target nucleic acids are DETECTED. The SARS-CoV-2 RNA is generally detectable in upper and lower  respiratory specimens dur ing the acute phase of infection.  Positive  results are indicative of active infection with SARS-CoV-2.  Clinical  correlation with patient history and other diagnostic information is  necessary to determine patient infection status.  Positive results do  not rule out bacterial infection or co-infection with other viruses. If result is PRESUMPTIVE POSTIVE SARS-CoV-2 nucleic acids MAY BE PRESENT.   A presumptive positive result was obtained on the submitted specimen  and confirmed on repeat testing.  While 2019 novel coronavirus  (SARS-CoV-2) nucleic acids may be present in the submitted sample  additional confirmatory testing may be necessary for epidemiological  and / or clinical management purposes  to differentiate between  SARS-CoV-2 and other Sarbecovirus currently known to infect humans.  If clinically indicated additional testing with an alternate test  methodology [phone]) is advised. The SARS-CoV-2 RNA is generally  detectable in upper and lower respiratory sp ecimens during the acute  phase of infection. The expected result is Negative. Fact Sheet for Patients:  StrictlyIdeas.no Fact Sheet for Healthcare Providers: BankingDealers.co.za This test is not yet approved or cleared by the [place] FDA and has been authorized for detection and/or diagnosis of SARS-CoV-2 by FDA under an Emergency Use Authorization (EUA).  This EUA will remain in effect (meaning this test can be used) for the duration of the COVID-19 declaration under Section 564(b)(1) of the Act, 21 U.S.C. section 360bbb-3(b)(1), unless the authorization is terminated or revoked sooner. Performed at [facility], [address]  [place],  [address]   MRSA PCR Screening     Status: None   Collection Time: 12/18/18  2:32 PM   Specimen: Nasal Mucosa; Nasopharyngeal  Result Value Ref Range Status   MRSA by PCR NEGATIVE NEGATIVE Final    Comment:        The GeneXpert MRSA Assay (FDA approved for NASAL specimens only), is one component of a comprehensive MRSA  colonization surveillance program. It is not intended to diagnose MRSA infection nor to guide or monitor treatment for MRSA infections. Performed at [facility], [address] [place], [place] [address]     Coagulation Studies: Recent Labs    12/22/18 0358  LABPROT 14.6  INR 1.2    Urinalysis: No results for input(s): COLORURINE, LABSPEC, PHURINE, GLUCOSEU, HGBUR, BILIRUBINUR, KETONESUR, PROTEINUR, UROBILINOGEN, NITRITE, LEUKOCYTESUR in the last 72 hours.  Invalid input(s): APPERANCEUR    Imaging: No results found.   Medications:    . Chlorhexidine Gluconate Cloth  6 each Topical Q0600  . docusate sodium  100 mg Oral BID  . epoetin (EPOGEN/PROCRIT) injection  10,000 Units Intravenous Q T,Th,Sa-HD  . feeding supplement (NEPRO CARB STEADY)  237 mL Oral BID BM  . influenza vac split quadrivalent PF  0.5 mL Intramuscular Tomorrow-1000  . multivitamin  1 tablet Oral QHS   acetaminophen **OR** [DISCONTINUED] acetaminophen, calcium carbonate, heparin, HYDROcodone-acetaminophen, LORazepam, morphine injection, [DISCONTINUED] ondansetron **OR** ondansetron (ZOFRAN) IV  Assessment/ Plan:  43 y.o. male with  end stage renal disease on hemodialysis followed by [facility], hypertension, coronary artery disease, secondary hyperparathyroidism status post parathyroidectomy who presents to [facility] with hyperkalemia and pulmonary edema.   [facility] TTS [place] Left AVF  Active Problems:   Acute on chronic respiratory failure with hypoxemia (HCC)   #. ESRD with hyperkalemia and Shortness of breath Recent Labs     12/21/18 0616 12/22/18 0358 12/23/18 0446 12/24/18 0817  CREATININE 6.86* 8.97* 7.52* 10.39*  urgent HD requested for SOB - orders prepared and dialysis nurse notified Patient received blood transfusion yesterday Potassium at upper limit- follow low K diet- 2 K dialysis today  #. Anemia of CKD  Lab Results  Component Value Date   HGB 8.4 (L) 12/24/2018  post transfusion  #. SHPTH  No results found for: PTH Lab Results  Component Value Date   PHOS 4.9 (H) 12/22/2018  monitor  # Vascular; complication of dialysis access - angiogram planned for tomorrow - difficult cannulation as outpatient per report   LOS: [place] 9/14/20202:51 PM  [place], [place]

## 2018-12-24 NOTE — Progress Notes (Signed)
PT Cancellation Note  Patient Details Name: Ralph Lucero MRN: [id number] DOB: February 14, 1976   Cancelled Treatment:    Reason Eval/Treat Not Completed: Medical issues which prohibited therapy(Chart reviewed, pt appears to have new fever and is now tachycardic. Will hold PT eval at this time, continue to follow remotely, and evaluate once pt is medically ready.)  2:49 PM, 12/24/18 Etta Grandchild, PT, DPT Physical Therapist - [facility]  [phone] ([place])    [place] 12/24/2018, 2:49 PM

## 2018-12-24 NOTE — Progress Notes (Signed)
Post HD report Pt tolerated well the tx. The prescribed BFR(300) could not be maintained as the AP kept going high. Several interventions were implemented including adjusting pt position, flushing both lumens, Venous line Heparin infusion as PO, however, AP kept going high. BFR of 250 was maintained throughout the treatment. UF goal  prescribed of 2000ml was met not including the 250ml of vancomycin to avoid dropping BP. Nephrologist was notified of treatment progression. Pt received EPO as hmg was 8.4 as of today. Pt temperature at the beginning of Tx was 103F, hence Pt received 650mg of tylenol to manage fever as PRN PO. Temp resolved at the end of tx to 98.3. Blood culture was drawn from catheter and sent to the lab.  Femoral catheter is locked with prescribed heparin lock, capped and clammed. Pre and post dialysis weight was taken and recorded.  Report was given to primary nurse.   12/24/18 1930  Hand-Off documentation  Report given to (Full Name) Kristine and Joy Beeman  RN   Report received from (Full Name) Dina Louis RN   Vital Signs  Temp 98.3 F (36.8 C)  Temp Source Oral  Pulse Rate 99  Pulse Rate Source Monitor  Resp 17  BP 116/84  BP Location Right Arm  BP Method Automatic  Patient Position (if appropriate) Lying  Oxygen Therapy  SpO2 99 %  O2 Device Room Air  Dialysis Weight  Weight 60.9 kg  Type of Weight Post-Dialysis  During Hemodialysis Assessment  Blood Flow Rate (mL/min) 250 mL/min  Arterial Pressure (mmHg) -200 mmHg  Venous Pressure (mmHg) 100 mmHg  Transmembrane Pressure (mmHg) 70 mmHg  Ultrafiltration Rate (mL/min) 840 mL/min  Dialysate Flow Rate (mL/min) 600 ml/min  Conductivity: Machine  13.9  HD Safety Checks Performed Yes  Intra-Hemodialysis Comments Tx completed  Post-Hemodialysis Assessment  Rinseback Volume (mL) 250 mL  KECN 45.1 V  Dialyzer Clearance Lightly streaked  Duration of HD Treatment -hour(s) 3.15 hour(s)  Hemodialysis Intake (mL) 750  mL  UF Total -Machine (mL) 2502 mL  Net UF (mL) 1752 mL  Tolerated HD Treatment Yes  Hemodialysis Catheter Left Femoral vein Triple lumen Temporary (Non-Tunneled)  Placement Date/Time: 12/18/18 1222   Placed prior to admission: No  Time Out: Correct patient;Correct procedure;Correct site  Maximum sterile barrier precautions: Hand hygiene;Sterile gown;Sterile gloves;Cap;Mask;Large sterile sheet  Site Prep: Chlorh...  Site Condition No complications  Blue Lumen Status Heparin locked  Red Lumen Status Heparin locked  Purple Lumen Status Capped (Central line)  Catheter fill solution Heparin 1000 units/ml  Catheter fill volume (Arterial) 1.8 cc  Catheter fill volume (Venous) 1.8  Dressing Type Biopatch;Occlusive  Dressing Status Clean;Dry;Intact  Drainage Description None  Dressing Change Due 12/29/18  Post treatment catheter status Capped and Clamped   

## 2018-12-24 NOTE — Plan of Care (Signed)
  Problem: Clinical Measurements: Goal: Ability to maintain clinical measurements within normal limits will improve Outcome: Progressing Goal: Will remain free from infection Outcome: Progressing Goal: Diagnostic test results will improve Outcome: Progressing Goal: Respiratory complications will improve Outcome: Progressing Goal: Cardiovascular complication will be avoided Outcome: Progressing  Pt arrived to Acute Room for HD tx. Pt reported was reporting feeling overloaded with fluid. Pt is currently receiving 6 L via non-rebreathe mask. SPO2 is 100. Pt is okay to receive HD Tx.

## 2018-12-24 NOTE — Progress Notes (Signed)
Placed on venturi mask for WOB and for trip to dialaysis.

## 2018-12-24 NOTE — Progress Notes (Signed)
[facility] Daily Progress Note   Subjective: 12/21/18:  1.  Repair right superficial femoral artery pseudoaneurysm 2.  Repair right femoral femoral traumatic AV fistula 3.  Evacuation right thigh hematoma 4.  Sartorius muscle flap coverage of the artery and vein  12/18/18:  1) Insertion of temporary dialysis catheter catheter left thigh approach  Seen and examined with interpreter and bedside nurse. Continued right thigh pain.   Objective: Vitals:   12/23/18 1902 12/23/18 2020 12/23/18 2134 12/24/18 0511  BP: 104/85 114/80 107/88 (!) 127/98  Pulse: 92 96 99 (!) 107  Resp:  18 16 20   Temp: 98.6 F (37 C) 98.7 F (37.1 C) 98.6 F (37 C) 99.3 F (37.4 C)  TempSrc: Oral Oral Oral Oral  SpO2: 96% 98% 98% 95%  Weight:      Height:        Intake/Output Summary (Last 24 hours) at 12/24/2018 1050 Last data filed at 12/24/2018 0700 Gross per 24 hour  Intake 1160.17 ml  Output 65 ml  Net 1095.17 ml   Physical Exam: A&Ox3, NAD CV: RRR Pulmonary: CTA Bilaterally Abdomen: Soft, Nontender, Nondistended Left Groin:  Temp Dialysis Catheter - clean try and intact Right Lower Extremity:   Thigh soft, calf soft. Extremity is warm distally to toes. Tender to palpation. JP intact with minimal amount of drainage. Left Upper Extremity: Thrill and bruit noted in distal aspect of access. Aneurysmal.    Laboratory: CBC    Component Value Date/Time   WBC 7.9 12/24/2018 0817   HGB 8.4 (L) 12/24/2018 0817   HCT 26.7 (L) 12/24/2018 0817   PLT PLATELET CLUMPS NOTED ON SMEAR, UNABLE TO ESTIMATE 12/24/2018 0817   BMET    Component Value Date/Time   NA 133 (L) 12/24/2018 0817   K 5.0 12/24/2018 0817   CL 96 (L) 12/24/2018 0817   CO2 20 (L) 12/24/2018 0817   GLUCOSE 95 12/24/2018 0817   BUN 61 (H) 12/24/2018 0817   CREATININE 10.39 (H) 12/24/2018 0817   CALCIUM 6.8 (L) 12/24/2018 0817   GFRNONAA 5 (L) 12/24/2018 0817   GFRAA 6 (L) 12/24/2018 0817    Assessment/Planning: The patient is a 43 year old male with a PMHx of ESRD 1) Currently being maintained by a temporary dialysis catheter. 2) Patient refusing placement of a Permcath. Consenting to fistulogram. 3) POD#3 right SFA pseudoaneurysm repair, hematoma evacuation - improvement in physical exam.  4) Will plan on removing JP during fistulogram when anesthesia is on board. Patient with very low threshold for pain.  Discussed with Dr. Ellis Parents Barnet Benavides PA-C 12/24/2018 10:50 AM

## 2018-12-24 NOTE — Progress Notes (Signed)
Post HD assessment   12/24/18 1915  Neurological  Level of Consciousness Alert  Orientation Level Oriented X4  Respiratory  Respiratory Pattern Regular;Unlabored  Bilateral Breath Sounds Clear;Diminished  Cardiac  Pulse Regular  Heart Sounds S1, S2  Jugular Venous Distention (JVD) No  ECG Monitor Yes  Vascular  R Radial Pulse +2  L Radial Pulse +1  Musculoskeletal  Musculoskeletal (WDL) X  Generalized Weakness Yes  Gastrointestinal  Bowel Sounds Assessment Hypoactive  GU Assessment  Genitourinary (WDL) X  Genitourinary Symptoms Oliguria  Psychosocial  Psychosocial (WDL) WDL

## 2018-12-24 NOTE — Progress Notes (Signed)
Pre HD assessment   12/24/18 1600  Neurological  Level of Consciousness Alert  Orientation Level Oriented X4  Respiratory  Respiratory Pattern Regular;Unlabored  Bilateral Breath Sounds Pleural rub  Cardiac  Pulse Regular  Heart Sounds S1, S2  Jugular Venous Distention (JVD) No  ECG Monitor Yes  Cardiac Rhythm ST  Vascular  R Radial Pulse +2  L Radial Pulse +1  Musculoskeletal  Musculoskeletal (WDL) X  Generalized Weakness Yes  Gastrointestinal  Bowel Sounds Assessment Hypoactive  GU Assessment  Genitourinary (WDL) X  Genitourinary Symptoms Oliguria  Psychosocial  Psychosocial (WDL) X  Patient Behaviors Anxious;Agitated  Needs Expressed Emotional  Emotional support given Given to patient

## 2018-12-24 NOTE — Progress Notes (Signed)
Pt arrived to HD room via transport. Medical Interpreter was at HD rrom as weel. As per pt report. Pt reports SOB and fluid overload. Temp upon arrival 103. 650mg  of tylenol was giver as the PRN order. Blood Culture drawn from Femoral Catheter. HR is relatively. MAP 100. BP WDL. Pt is ready to receive dialysis. BFR initiated at 200 and will keep monitor the AP and VP   12/24/18 1600  Hand-Off documentation  Report given to (Full Name) Sueanne Margarita RN  Report received from (Full Name) Laureen Ochs RN  Vital Signs  Temp (!) 103 F (39.4 C)  Temp Source Oral  Pulse Rate (!) 117  Pulse Rate Source Monitor  Resp (!) 25  BP (!) 140/97  BP Location Right Arm  BP Method Automatic  Patient Position (if appropriate) Lying  Oxygen Therapy  SpO2 100 %  O2 Device Partial Rebreather Mask  O2 Flow Rate (L/min) 2 L/min  Pain Assessment  Pain Scale 0-10  Pain Score 0  Dialysis Weight  Weight 62.4 kg  Type of Weight Pre-Dialysis  Time-Out for Hemodialysis  What Procedure? HD  Pt Identifiers(min of two) First/Last Name;MRN/Account#  Correct Site? Yes  Correct Side? Yes  Correct Procedure? Yes  Safety Precautions Reviewed? Yes  Engineer, civil (consulting) Number 5  Station Number 4  UF/Alarm Test Passed  Conductivity: Meter 13.9  Conductivity: Machine  13.4  pH 7.2  Reverse Osmosis Main  Normal Saline Lot Number [id number]  Dialyzer Lot Number [id number]  Disposable Set Lot Number [id number]  Machine Temperature 98.6 F (37 C)  Musician and Audible Yes  Blood Lines Intact and Secured Yes  Pre Treatment Patient Checks  Vascular access used during treatment Catheter  HD catheter dressing before treatment WDL  Patient is receiving dialysis in a chair  (In bed)  Hepatitis B Surface Antigen Results Negative  Date Hepatitis B Surface Antigen Drawn 12/18/18  Hepatitis B Surface Antibody  (<10)  Date Hepatitis B Surface Antibody Drawn 12/18/18  Hemodialysis Consent Verified Yes   Hemodialysis Standing Orders Initiated Yes  ECG (Telemetry) Monitor On Yes  Prime Ordered Normal Saline  Length of  DialysisTreatment -hour(s) 3 Hour(s)  Dialysis Treatment Comments  (Na250)  Dialyzer Elisio 17H NR  Dialysate 2K;2.5 Ca  Dialysis Anticoagulant None  Dialysate Flow Ordered 600  Blood Flow Rate Ordered 400 mL/min  Ultrafiltration Goal 2 Liters  During Hemodialysis Assessment  Blood Flow Rate (mL/min) 200 mL/min  Arterial Pressure (mmHg) -180 mmHg  Venous Pressure (mmHg) 140 mmHg  Transmembrane Pressure (mmHg) 70 mmHg  Ultrafiltration Rate (mL/min) 840 mL/min  Dialysate Flow Rate (mL/min) 600 ml/min  Conductivity: Machine  13.9  HD Safety Checks Performed Yes  Dialysis Fluid Bolus Normal Saline  Bolus Amount (mL) 250 mL  Intra-Hemodialysis Comments Tx initiated  Education / Care Plan  Dialysis Education Provided Yes  Documented Education in Care Plan Yes  Hemodialysis Catheter Left Femoral vein Triple lumen Temporary (Non-Tunneled)  Placement Date/Time: 12/18/18 1222   Placed prior to admission: No  Time Out: Correct patient;Correct procedure;Correct site  Maximum sterile barrier precautions: Hand hygiene;Sterile gown;Sterile gloves;Cap;Mask;Large sterile sheet  Site Prep: Chlorh...  Site Condition No complications  Blue Lumen Status Blood return noted  Red Lumen Status Blood return noted  Purple Lumen Status Capped (Central line)  Dressing Type Biopatch;Occlusive  Dressing Status Clean;Dry;Intact  Drainage Description None

## 2018-12-24 NOTE — Progress Notes (Signed)
Patient ID: Ralph Lucero, male   DOB: 03/24/1976, 43 y.o.   MRN: [id number]  [facility] PROGRESS NOTE  Ralph Lucero [id number] DOB: [birth year] DOA: 12/17/2018 PCP: Anthonette Legato, MD  HPI/Subjective: Patient seen this morning and again this afternoon.  This afternoon developed shortness of breath.  No complaints of chest pain.  States he cannot lie flat.  Normally when he has too much fluid he goes outside and sweats things off.  Patient very short of breath now.  Received a unit of blood yesterday.  Did not have output with the Lasix given with the blood or Lasix this morning.  Objective: Vitals:   12/24/18 0511 12/24/18 1421  BP: (!) 127/98 (!) 138/103  Pulse: (!) 107 (!) 115  Resp: 20 17  Temp: 99.3 F (37.4 C) (!) 101 F (38.3 C)  SpO2: 95% 98%    Filed Weights   12/22/18 0900 12/22/18 0914 12/22/18 1230  Weight: 62.5 kg 62.5 kg 61.9 kg    ROS: Review of Systems  Constitutional: Negative for chills and fever.  Eyes: Negative for blurred vision.  Respiratory: Positive for cough and shortness of breath.   Cardiovascular: Negative for chest pain.  Gastrointestinal: Negative for abdominal pain, constipation, diarrhea, nausea and vomiting.  Genitourinary: Negative for dysuria.  Musculoskeletal: Negative for joint pain.  Neurological: Negative for dizziness and headaches.   Exam: Physical Exam  Constitutional: He is oriented to person, place, and time.  HENT:  Nose: No mucosal edema.  Mouth/Throat: No oropharyngeal exudate or posterior oropharyngeal edema.  Eyes: Pupils are equal, round, and reactive to light. Conjunctivae, EOM and lids are normal.  Neck: No JVD present. Carotid bruit is not present. No edema present. No thyroid mass and no thyromegaly present.  Cardiovascular: S1 normal and S2 normal. Exam reveals no gallop.  No murmur heard. Pulses:      Dorsalis pedis pulses are 2+ on the right side and 2+ on the left side.  Respiratory: No  respiratory distress. He has decreased breath sounds in the right lower field and the left lower field. He has no wheezes. He has no rhonchi. He has rales in the right middle field, the right lower field, the left middle field and the left lower field.  GI: Soft. Bowel sounds are normal. There is no abdominal tenderness.  Musculoskeletal:     Right ankle: He exhibits no swelling.     Left ankle: He exhibits no swelling.  Lymphadenopathy:    He has no cervical adenopathy.  Neurological: He is alert and oriented to person, place, and time. No cranial nerve deficit.  Skin: Skin is warm. No rash noted. Nails show no clubbing.  Right groin covered  Psychiatric: He has a normal mood and affect.      Data Reviewed: Basic Metabolic Panel: Recent Labs  Lab 12/17/18 2348  12/19/18 0920  12/20/18 0432 12/21/18 0616 12/21/18 1455 12/22/18 0358 12/23/18 0446 12/24/18 0817  NA 143  --  138  --  139 138  --  133* 137 133*  K 6.8*   < > 6.0*   < > 4.3 4.4  --  5.1 4.1 5.0  CL 100  --  97*  --  100 100  --  98 100 96*  CO2 24  --  26  --  28 27  --  25 27 20*  GLUCOSE 124*  --  112*  --  119* 141* 117* 140* 98 95  BUN 94*  --  59*  --  34* 25*  --  38* 37* 61*  CREATININE 16.61*  --  13.03*  --  9.07* 6.86*  --  8.97* 7.52* 10.39*  CALCIUM 8.2*  --  7.4*  --  7.5* 7.7*  --  6.4* 6.9* 6.8*  MG 3.0*  --   --   --   --   --   --  1.8  --   --   PHOS  --   --  4.3  --  4.5  --   --  4.9*  --   --    < > = values in this interval not displayed.   Liver Function Tests: Recent Labs  Lab 12/17/18 2348 12/19/18 0920 12/20/18 0432 12/22/18 0358  AST 23  --   --  34  ALT 30  --   --  16  ALKPHOS 111  --   --  59  BILITOT 0.8  --   --  0.4  PROT 8.0  --   --  5.2*  ALBUMIN 4.5 3.4* 3.3* 2.8*   CBC: Recent Labs  Lab 12/17/18 2348 12/18/18 0648  12/20/18 0100  12/21/18 0616 12/21/18 1846 12/22/18 0358 12/23/18 0446 12/24/18 0817  WBC 9.6 5.5  --  7.1   < > 7.1 8.3 6.5 5.8 7.9   NEUTROABS 7.7 4.2  --  5.1  --  5.2  --   --   --   --   HGB 11.1* 9.8*   < > 7.0*   < > 8.1* 5.3* 7.3* 6.6* 8.4*  HCT 34.9* 30.4*   < > 21.2*   < > 24.7* 16.4* 22.2* 20.5* 26.7*  MCV 97.5 96.8  --  95.5   < > 96.5 97.6 89.2 91.1 95.7  PLT 140* 142*  --  90*   < > 69* 83* 64* 83* PLATELET CLUMPS NOTED ON SMEAR, UNABLE TO ESTIMATE   < > = values in this interval not displayed.   BNP (last 3 results) Recent Labs    12/17/18 2348  BNP 1,555.0*     CBG: Recent Labs  Lab 12/22/18 0743 12/23/18 0850 12/23/18 1221 12/23/18 1817 12/24/18 0731  GLUCAP 96 53* 67* 65* 83    Recent Results (from the past 240 hour(s))  SARS Coronavirus 2 [facility] order, Performed in [facility] hospital lab) Nasopharyngeal Nasopharyngeal Swab     Status: None   Collection Time: 12/18/18 12:31 AM   Specimen: Nasopharyngeal Swab  Result Value Ref Range Status   SARS Coronavirus 2 NEGATIVE NEGATIVE Final    Comment: (NOTE) If result is NEGATIVE SARS-CoV-2 target nucleic acids are NOT DETECTED. The SARS-CoV-2 RNA is generally detectable in upper and lower  respiratory specimens during the acute phase of infection. The lowest  concentration of SARS-CoV-2 viral copies this assay can detect is 250  copies / mL. A negative result does not preclude SARS-CoV-2 infection  and should not be used as the sole basis for treatment or other  patient management decisions.  A negative result may occur with  improper specimen collection / handling, submission of specimen other  than nasopharyngeal swab, presence of viral mutation(s) within the  areas targeted by this assay, and inadequate number of viral copies  (<250 copies / mL). A negative result must be combined with clinical  observations, patient history, and epidemiological information. If result is POSITIVE SARS-CoV-2 target nucleic acids are DETECTED. The SARS-CoV-2 RNA is generally detectable in upper and lower  respiratory specimens dur ing the  acute  phase of infection.  Positive  results are indicative of active infection with SARS-CoV-2.  Clinical  correlation with patient history and other diagnostic information is  necessary to determine patient infection status.  Positive results do  not rule out bacterial infection or co-infection with other viruses. If result is PRESUMPTIVE POSTIVE SARS-CoV-2 nucleic acids MAY BE PRESENT.   A presumptive positive result was obtained on the submitted specimen  and confirmed on repeat testing.  While 2019 novel coronavirus  (SARS-CoV-2) nucleic acids may be present in the submitted sample  additional confirmatory testing may be necessary for epidemiological  and / or clinical management purposes  to differentiate between  SARS-CoV-2 and other Sarbecovirus currently known to infect humans.  If clinically indicated additional testing with an alternate test  methodology [phone]) is advised. The SARS-CoV-2 RNA is generally  detectable in upper and lower respiratory sp ecimens during the acute  phase of infection. The expected result is Negative. Fact Sheet for Patients:  StrictlyIdeas.no Fact Sheet for Healthcare Providers: BankingDealers.co.za This test is not yet approved or cleared by the [place] FDA and has been authorized for detection and/or diagnosis of SARS-CoV-2 by FDA under an Emergency Use Authorization (EUA).  This EUA will remain in effect (meaning this test can be used) for the duration of the COVID-19 declaration under Section 564(b)(1) of the Act, 21 U.S.C. section 360bbb-3(b)(1), unless the authorization is terminated or revoked sooner. Performed at [facility], [address] [place], [place] [address]   MRSA PCR Screening     Status: None   Collection Time: 12/18/18  2:32 PM   Specimen: Nasal Mucosa; Nasopharyngeal  Result Value Ref Range Status   MRSA by PCR NEGATIVE NEGATIVE Final    Comment:        The  GeneXpert MRSA Assay (FDA approved for NASAL specimens only), is one component of a comprehensive MRSA colonization surveillance program. It is not intended to diagnose MRSA infection nor to guide or monitor treatment for MRSA infections. Performed at [facility], [address] [place], [place] [address]       Scheduled Meds: . aspirin EC  81 mg Oral Daily  . Chlorhexidine Gluconate Cloth  6 each Topical Q0600  . docusate sodium  100 mg Oral BID  . epoetin (EPOGEN/PROCRIT) injection  10,000 Units Intravenous Q T,Th,Sa-HD  . feeding supplement (NEPRO CARB STEADY)  237 mL Oral BID BM  . influenza vac split quadrivalent PF  0.5 mL Intramuscular Tomorrow-1000  . [START ON 12/25/2018] isosorbide mononitrate  30 mg Oral Daily  . metoprolol tartrate  25 mg Oral BID  . multivitamin  1 tablet Oral QHS  . ticagrelor  90 mg Oral BID     Assessment/Plan:  1. Acute respiratory failure with shortness of breath.  Likely fluid overload.  Chest x-ray done stat and looks more like fluid rather than pneumonia.  Patient also having fever.  Will empirically give vancomycin and Zosyn.  Case discussed with nephrology to give dialysis stat.  Oxygen supplementation.   2. Right thigh hematoma, right superficial femoral artery pseudoaneurysm.  Patient went to the operating for urgent repair on Friday.  Vascular surgery following. 3. End-stage renal disease with fluid overload.  Patient with shortness of breath now.  Will get dialysis. 4. Dialysis access problem with the left fistula.  Will need a angiogram which was rescheduled for tomorrow. 5. CAD with elevated troponin.  STEMI in May.  EKG does not show STEMI.  Showed sinus tachycardia with Q waves inferiorly and anterior laterally.  Flipped T waves laterally.  Similar to previous EKG.  Restart aspirin, Brilinta and metoprolol and Imdur. 6. Acute blood loss anemia with hemoglobin responding to blood transfusion yesterday.  Hemoglobin 8.4  today.   Code Status:     Code Status Orders  (From admission, onward)         Start     Ordered   12/18/18 0441  Full code  Continuous     12/18/18 0440        Code Status History    This patient has a current code status but no historical code status.   Advance Care Planning Activity     Family Communication: Spoke with family at the bedside. Disposition Plan: To be determined  Consultants:  Critical care specialist  Vascular surgery  Nephrology  Cardiology  Time spent: 25 minutes earlier and another 25 minutes now.  Case discussed with vascular surgery nephrology.  Jaiyah Beining [employer]

## 2018-12-24 NOTE — Progress Notes (Signed)
Pharmacy Antibiotic Note  Ralph Lucero is a 43 y.o. male admitted on 12/17/2018 with fever/wound /empiric abx.  Pharmacy has been consulted for Vancomycin and Zosyn dosing.   Plan: - Zosyn 3.375gm EI IV q12h for Hemodialysis dosing  - Vancomycin 1500mg  IV x 1 as loading dose- patient going to Dialysis and this may end up being given during HD. Normal HD schedule TTS. F/u to see if patient will get another dialysis tomorrow Tues 9/15 and what schedule will be. Would schedule Vanc 750mg  IV with Dialysis when scheule determined and check Vanc level prior to 3rd HD session.      Height: 6' (182.9 cm) Weight: 136 lb 7.4 oz (61.9 kg) IBW/kg (Calculated) : 77.6  Temp (24hrs), Avg:99.2 F (37.3 C), Min:98.6 F (37 C), Max:101 F (38.3 C)  Recent Labs  Lab 12/20/18 0432  12/21/18 0616 12/21/18 1846 12/22/18 0358 12/23/18 0446 12/24/18 0817  WBC  --    < > 7.1 8.3 6.5 5.8 7.9  CREATININE 9.07*  --  6.86*  --  8.97* 7.52* 10.39*   < > = values in this interval not displayed.    Estimated Creatinine Clearance: 8 mL/min (A) (by C-G formula based on SCr of 10.39 mg/dL (H)).    No Known Allergies  Antimicrobials this admission: Vanc 9/14 >>   Zosyn 9/14 >>    Dose adjustments this admission:    Microbiology results:   BCx:     UCx:      Sputum:    9/8 MRSA PCR: negative  Thank you for allowing pharmacy to be a part of this patient's care.  Nechama Escutia A 12/24/2018 3:13 PM

## 2018-12-25 ENCOUNTER — Other Ambulatory Visit (INDEPENDENT_AMBULATORY_CARE_PROVIDER_SITE_OTHER): Payer: Self-pay | Admitting: Vascular Surgery

## 2018-12-25 MED ORDER — VANCOMYCIN HCL IN DEXTROSE 750-5 MG/150ML-% IV SOLN
750.0000 mg | Freq: Once | INTRAVENOUS | Status: DC
  Filled 2018-12-25: qty 150

## 2018-12-25 MED ORDER — POLYETHYLENE GLYCOL 3350 17 G PO PACK: 17.0000 g | PACK | Freq: Every day | ORAL | Status: DC

## 2018-12-25 NOTE — Progress Notes (Signed)
TREATMENT INITIATED GOAL TO REMOVE 2.5L, 2K BATH FOR 3 HOUR TREATMENT   12/25/18 1415  Vital Signs  Pulse Rate 98  Resp 18  BP 110/77  BP Location Right Arm  BP Method Automatic  Patient Position (if appropriate) Lying  Oxygen Therapy  SpO2 96 %  O2 Device Nasal Cannula  O2 Flow Rate (L/min) 3 L/min  During Hemodialysis Assessment  Blood Flow Rate (mL/min) 350 mL/min  Arterial Pressure (mmHg) -190 mmHg  Venous Pressure (mmHg) 130 mmHg  Transmembrane Pressure (mmHg) 60 mmHg  Ultrafiltration Rate (mL/min) 1000 mL/min  Dialysate Flow Rate (mL/min) 800 ml/min  Conductivity: Machine  14.2  HD Safety Checks Performed Yes  Dialysis Fluid Bolus Normal Saline  Bolus Amount (mL) 250 mL  Intra-Hemodialysis Comments Tx initiated  Hemodialysis Catheter Left Femoral vein Triple lumen Temporary (Non-Tunneled)  Placement Date/Time: 12/18/18 1222   Placed prior to admission: No  Time Out: Correct patient;Correct procedure;Correct site  Maximum sterile barrier precautions: Hand hygiene;Sterile gown;Sterile gloves;Cap;Mask;Large sterile sheet  Site Prep: Chlorh...  Site Condition No complications  Blue Lumen Status Flushed;Blood return noted  Red Lumen Status Flushed;Blood return noted  Dressing Type Biopatch;Occlusive  Dressing Status Clean;Dry;Intact

## 2018-12-25 NOTE — Progress Notes (Signed)
Established hemodialysis patient known at [place] 5:45. Please contact me directly with any dialysis placement concerns.   Elvera Bicker Dialysis Coordinator [phone]

## 2018-12-25 NOTE — Progress Notes (Signed)
PT Cancellation Note  Patient Details Name: Ralph Lucero MRN: [id number] DOB: April 24, 1975   Cancelled Treatment:    Reason Eval/Treat Not Completed: Medical issues which prohibited therapy Pt still has temp fem cath, nurse reports that he should have UE access this afternoon, will attempt PT exam when temp cath is out.    Kreg Shropshire, DPT 12/25/2018, 10:58 AM

## 2018-12-25 NOTE — Progress Notes (Signed)
[facility], [place] 12/25/18  Subjective:   LOS: 7 09/14 0701 - 09/15 0700 In: -  Out: [id number] [Drains:20]   Yesterday 1700 cc of fluid was removed with dialysis.  Feels better today.  T-max 99.8 States he was able to walk with a walker No leg edema in the left leg Able to eat some today   Objective:  Vital signs in last 24 hours:  Temp:  [98.3 F (36.8 C)-99.8 F (37.7 C)] 98.9 F (37.2 C) (09/15 1400) Pulse Rate:  [87-112] 87 (09/15 1530) Resp:  [17-25] 19 (09/15 1530) BP: (102-135)/(75-96) 104/80 (09/15 1530) SpO2:  [88 %-100 %] 100 % (09/15 1530) Weight:  [58.6 kg-60.9 kg] 58.6 kg (09/15 1400)  Weight change:  Filed Weights   12/24/18 1600 12/24/18 1930 12/25/18 1400  Weight: 62.4 kg 60.9 kg 58.6 kg    Intake/Output:    Intake/Output Summary (Last 24 hours) at 12/25/2018 1614 Last data filed at 12/25/2018 0809 Gross per 24 hour  Intake 15 ml  Output 1772 ml  Net -1757 ml     Physical Exam: General: Anxious appearing  HEENT Anicteric. Moist oral mucus membranes  Neck supple  Pulm/lungs B/l basilar crackles  CVS/Heart Tachycardic, regular  Abdomen:  Soft, NT  Extremities:  1-2+ edema on the right leg  Neurologic: Alert. Able to follow commands  Skin: warm  Access: Left fem temp cath, left arm aneurysmal AVF       Basic Metabolic Panel:  Recent Labs  Lab 12/19/18 0920  12/20/18 0432 12/21/18 0616 12/21/18 1455 12/22/18 0358 12/23/18 0446 12/24/18 0817  NA 138  --  139 138  --  133* 137 133*  K 6.0*   < > 4.3 4.4  --  5.1 4.1 5.0  CL 97*  --  100 100  --  98 100 96*  CO2 26  --  28 27  --  25 27 20*  GLUCOSE 112*  --  119* 141* 117* 140* 98 95  BUN 59*  --  34* 25*  --  38* 37* 61*  CREATININE 13.03*  --  9.07* 6.86*  --  8.97* 7.52* 10.39*  CALCIUM 7.4*  --  7.5* 7.7*  --  6.4* 6.9* 6.8*  MG  --   --   --   --   --  1.8  --   --   PHOS 4.3  --  4.5  --   --  4.9*  --   --    < > = values in this interval not  displayed.     CBC: Recent Labs  Lab 12/20/18 0100  12/21/18 0616 12/21/18 1846 12/22/18 0358 12/23/18 0446 12/24/18 0817  WBC 7.1   < > 7.1 8.3 6.5 5.8 7.9  NEUTROABS 5.1  --  5.2  --   --   --   --   HGB 7.0*   < > 8.1* 5.3* 7.3* 6.6* 8.4*  HCT 21.2*   < > 24.7* 16.4* 22.2* 20.5* 26.7*  MCV 95.5   < > 96.5 97.6 89.2 91.1 95.7  PLT 90*   < > 69* 83* 64* 83* PLATELET CLUMPS NOTED ON SMEAR, UNABLE TO ESTIMATE   < > = values in this interval not displayed.      Lab Results  Component Value Date   HEPBSAG Negative 12/18/2018      Microbiology:  Recent Results (from the past 240 hour(s))  SARS Coronavirus 2 [facility] order, Performed in [facility]  Health hospital lab) Nasopharyngeal Nasopharyngeal Swab     Status: None   Collection Time: 12/18/18 12:31 AM   Specimen: Nasopharyngeal Swab  Result Value Ref Range Status   SARS Coronavirus 2 NEGATIVE NEGATIVE Final    Comment: (NOTE) If result is NEGATIVE SARS-CoV-2 target nucleic acids are NOT DETECTED. The SARS-CoV-2 RNA is generally detectable in upper and lower  respiratory specimens during the acute phase of infection. The lowest  concentration of SARS-CoV-2 viral copies this assay can detect is 250  copies / mL. A negative result does not preclude SARS-CoV-2 infection  and should not be used as the sole basis for treatment or other  patient management decisions.  A negative result may occur with  improper specimen collection / handling, submission of specimen other  than nasopharyngeal swab, presence of viral mutation(s) within the  areas targeted by this assay, and inadequate number of viral copies  (<250 copies / mL). A negative result must be combined with clinical  observations, patient history, and epidemiological information. If result is POSITIVE SARS-CoV-2 target nucleic acids are DETECTED. The SARS-CoV-2 RNA is generally detectable in upper and lower  respiratory specimens dur ing the acute phase of infection.   Positive  results are indicative of active infection with SARS-CoV-2.  Clinical  correlation with patient history and other diagnostic information is  necessary to determine patient infection status.  Positive results do  not rule out bacterial infection or co-infection with other viruses. If result is PRESUMPTIVE POSTIVE SARS-CoV-2 nucleic acids MAY BE PRESENT.   A presumptive positive result was obtained on the submitted specimen  and confirmed on repeat testing.  While 2019 novel coronavirus  (SARS-CoV-2) nucleic acids may be present in the submitted sample  additional confirmatory testing may be necessary for epidemiological  and / or clinical management purposes  to differentiate between  SARS-CoV-2 and other Sarbecovirus currently known to infect humans.  If clinically indicated additional testing with an alternate test  methodology [phone]) is advised. The SARS-CoV-2 RNA is generally  detectable in upper and lower respiratory sp ecimens during the acute  phase of infection. The expected result is Negative. Fact Sheet for Patients:  StrictlyIdeas.no Fact Sheet for Healthcare Providers: BankingDealers.co.za This test is not yet approved or cleared by the [place] FDA and has been authorized for detection and/or diagnosis of SARS-CoV-2 by FDA under an Emergency Use Authorization (EUA).  This EUA will remain in effect (meaning this test can be used) for the duration of the COVID-19 declaration under Section 564(b)(1) of the Act, 21 U.S.C. section 360bbb-3(b)(1), unless the authorization is terminated or revoked sooner. Performed at [facility], [address] [place], [place] [address]   MRSA PCR Screening     Status: None   Collection Time: 12/18/18  2:32 PM   Specimen: Nasal Mucosa; Nasopharyngeal  Result Value Ref Range Status   MRSA by PCR NEGATIVE NEGATIVE Final    Comment:        The GeneXpert MRSA Assay  (FDA approved for NASAL specimens only), is one component of a comprehensive MRSA colonization surveillance program. It is not intended to diagnose MRSA infection nor to guide or monitor treatment for MRSA infections. Performed at [facility], [address] [place], [place] [address]   Culture, blood (Routine X 2) w Reflex to ID Panel     Status: None (Preliminary result)   Collection Time: 12/24/18  4:20 PM   Specimen: BLOOD  Result Value Ref Range Status  Specimen Description BLOOD LEFT FEMORAL CATH  Final   Special Requests   Final    BOTTLES DRAWN AEROBIC AND ANAEROBIC Blood Culture results may not be optimal due to an excessive volume of blood received in culture bottles   Culture   Final    NO GROWTH < 24 HOURS Performed at [facility], [address] [place], [place] [address]    Report Status PENDING  Incomplete  Culture, blood (Routine X 2) w Reflex to ID Panel     Status: None (Preliminary result)   Collection Time: 12/24/18  4:25 PM   Specimen: BLOOD  Result Value Ref Range Status   Specimen Description BLOOD LEFT FEMORAL CATH  Final   Special Requests   Final    BOTTLES DRAWN AEROBIC AND ANAEROBIC Blood Culture results may not be optimal due to an excessive volume of blood received in culture bottles   Culture   Final    NO GROWTH < 24 HOURS Performed at [facility], [address] [place],  [address]    Report Status PENDING  Incomplete    Coagulation Studies: No results for input(s): LABPROT, INR in the last 72 hours.  Urinalysis: No results for input(s): COLORURINE, LABSPEC, PHURINE, GLUCOSEU, HGBUR, BILIRUBINUR, KETONESUR, PROTEINUR, UROBILINOGEN, NITRITE, LEUKOCYTESUR in the last 72 hours.  Invalid input(s): APPERANCEUR    Imaging: Dg Chest Port 1 View  Result Date: 12/24/2018 CLINICAL DATA:  43 year old male with history of shortness of breath. EXAM: PORTABLE CHEST 1 VIEW COMPARISON:  Chest x-ray  12/18/2018. FINDINGS: There is cephalization of the pulmonary vasculature, indistinctness of the interstitial markings, and patchy airspace disease throughout the lungs bilaterally suggestive of moderate pulmonary edema. No pneumothorax. Small bilateral pleural effusions. Mild cardiomegaly. Upper mediastinal contours are within normal limits. IMPRESSION: 1. The appearance the chest suggests mildly worsened congestive heart failure, as above. Electronically Signed   By: Vinnie Langton M.D.   On: 12/24/2018 15:32     Medications:   . piperacillin-tazobactam (ZOSYN)  IV 3.375 g (12/25/18 0516)  . [START ON 12/26/2018] vancomycin     . aspirin EC  81 mg Oral Daily  . Chlorhexidine Gluconate Cloth  6 each Topical Q0600  . docusate sodium  100 mg Oral BID  . epoetin (EPOGEN/PROCRIT) injection  10,000 Units Intravenous Q T,Th,Sa-HD  . feeding supplement (NEPRO CARB STEADY)  237 mL Oral BID BM  . influenza vac split quadrivalent PF  0.5 mL Intramuscular Tomorrow-1000  . isosorbide mononitrate  30 mg Oral Daily  . metoprolol tartrate  25 mg Oral BID  . multivitamin  1 tablet Oral QHS  . polyethylene glycol  17 g Oral Daily  . ticagrelor  90 mg Oral BID  . vancomycin variable dose per unstable renal function (pharmacist dosing)   Does not apply See admin instructions   acetaminophen **OR** [DISCONTINUED] acetaminophen, calcium carbonate, heparin, HYDROcodone-acetaminophen, LORazepam, morphine injection, [DISCONTINUED] ondansetron **OR** ondansetron (ZOFRAN) IV  Assessment/ Plan:  43 y.o. male with  end stage renal disease on hemodialysis followed by [facility] Nephrology, hypertension, coronary artery disease, secondary hyperparathyroidism status post parathyroidectomy who presents to [facility] with hyperkalemia and pulmonary edema.   [facility] TTS [place] Left AVF  Active Problems:   Acute on chronic respiratory failure with hypoxemia (HCC)   #. ESRD with hyperkalemia and Shortness of  breath Recent Labs    12/21/18 0616 12/22/18 0358 12/23/18 0446 12/24/18 0817  CREATININE 6.86* 8.97* 7.52* 10.39*  urgent HD yesterday.  1700 cc was removed Patient received blood transfusion this admission Today's patient's regular hemodialysis today.  Will dialyze today with anticipation of some volume removal. Potassium level now corrected  #. Anemia of CKD  Lab Results  Component Value Date   HGB 8.4 (L) 12/24/2018  post transfusion  #. SHPTH  No results found for: PTH Lab Results  Component Value Date   PHOS 4.9 (H) 12/22/2018  monitor  # Vascular; complication of dialysis access - angiogram planned for tomorrow - difficult cannulation as outpatient per report   LOS: 7 Dynesha Woolen 9/15/20204:14 PM  [facility] [place], [place]

## 2018-12-25 NOTE — Progress Notes (Signed)
Patient ID: Ralph Lucero, male   DOB: 1975-07-18, 43 y.o.   MRN: [id number]  [employer] PROGRESS NOTE  Ralph Lucero [id number] DOB: 07-Mar-1976 DOA: 12/17/2018 PCP: Anthonette Legato, MD  HPI/Subjective: Patient still complains of shortness of breath today better than what it was yesterday.  He still feels that he is overweight.  He normally weighs 48 to 50 kg.  Objective: Vitals:   12/24/18 2008 12/25/18 0529  BP: (!) 115/96 (!) 118/94  Pulse: (!) 109 (!) 108  Resp: (!) 22 20  Temp: 99.3 F (37.4 C) 99.8 F (37.7 C)  SpO2: 92% (!) 88%    Filed Weights   12/22/18 1230 12/24/18 1600 12/24/18 1930  Weight: 61.9 kg 62.4 kg 60.9 kg    ROS: Review of Systems  Constitutional: Negative for chills and fever.  Eyes: Negative for blurred vision.  Respiratory: Positive for cough and shortness of breath.   Cardiovascular: Negative for chest pain.  Gastrointestinal: Negative for abdominal pain, constipation, diarrhea, nausea and vomiting.  Genitourinary: Negative for dysuria.  Musculoskeletal: Negative for joint pain.  Neurological: Negative for dizziness and headaches.   Exam: Physical Exam  Constitutional: He is oriented to person, place, and time.  HENT:  Nose: No mucosal edema.  Mouth/Throat: No oropharyngeal exudate or posterior oropharyngeal edema.  Eyes: Pupils are equal, round, and reactive to light. Conjunctivae, EOM and lids are normal.  Neck: No JVD present. Carotid bruit is not present. No edema present. No thyroid mass and no thyromegaly present.  Cardiovascular: S1 normal and S2 normal. Exam reveals no gallop.  No murmur heard. Pulses:      Dorsalis pedis pulses are 2+ on the right side and 2+ on the left side.  Respiratory: No respiratory distress. He has decreased breath sounds in the right lower field and the left lower field. He has no wheezes. He has no rhonchi. He has rales in the right lower field and the left lower field.  GI: Soft.  Bowel sounds are normal. There is no abdominal tenderness.  Genitourinary:    Genitourinary Comments: Scrotal swelling to twice the size.   Musculoskeletal:     Right ankle: He exhibits no swelling.     Left ankle: He exhibits no swelling.  Lymphadenopathy:    He has no cervical adenopathy.  Neurological: He is alert and oriented to person, place, and time. No cranial nerve deficit.  Skin: Skin is warm. No rash noted. Nails show no clubbing.  Right groin covered  Psychiatric: He has a normal mood and affect.      Data Reviewed: Basic Metabolic Panel: Recent Labs  Lab 12/19/18 0920  12/20/18 0432 12/21/18 0616 12/21/18 1455 12/22/18 0358 12/23/18 0446 12/24/18 0817  NA 138  --  139 138  --  133* 137 133*  K 6.0*   < > 4.3 4.4  --  5.1 4.1 5.0  CL 97*  --  100 100  --  98 100 96*  CO2 26  --  28 27  --  25 27 20*  GLUCOSE 112*  --  119* 141* 117* 140* 98 95  BUN 59*  --  34* 25*  --  38* 37* 61*  CREATININE 13.03*  --  9.07* 6.86*  --  8.97* 7.52* 10.39*  CALCIUM 7.4*  --  7.5* 7.7*  --  6.4* 6.9* 6.8*  MG  --   --   --   --   --  1.8  --   --  PHOS 4.3  --  4.5  --   --  4.9*  --   --    < > = values in this interval not displayed.   Liver Function Tests: Recent Labs  Lab 12/19/18 0920 12/20/18 0432 12/22/18 0358  AST  --   --  34  ALT  --   --  16  ALKPHOS  --   --  59  BILITOT  --   --  0.4  PROT  --   --  5.2*  ALBUMIN 3.4* 3.3* 2.8*   CBC: Recent Labs  Lab 12/20/18 0100  12/21/18 0616 12/21/18 1846 12/22/18 0358 12/23/18 0446 12/24/18 0817  WBC 7.1   < > 7.1 8.3 6.5 5.8 7.9  NEUTROABS 5.1  --  5.2  --   --   --   --   HGB 7.0*   < > 8.1* 5.3* 7.3* 6.6* 8.4*  HCT 21.2*   < > 24.7* 16.4* 22.2* 20.5* 26.7*  MCV 95.5   < > 96.5 97.6 89.2 91.1 95.7  PLT 90*   < > 69* 83* 64* 83* PLATELET CLUMPS NOTED ON SMEAR, UNABLE TO ESTIMATE   < > = values in this interval not displayed.   BNP (last 3 results) Recent Labs    12/17/18 2348  BNP 1,555.0*      CBG: Recent Labs  Lab 12/22/18 0743 12/23/18 0850 12/23/18 1221 12/23/18 1817 12/24/18 0731  GLUCAP 96 53* 67* 65* 83    Recent Results (from the past 240 hour(s))  SARS Coronavirus 2 [facility] order, Performed in [facility] hospital lab) Nasopharyngeal Nasopharyngeal Swab     Status: None   Collection Time: 12/18/18 12:31 AM   Specimen: Nasopharyngeal Swab  Result Value Ref Range Status   SARS Coronavirus 2 NEGATIVE NEGATIVE Final    Comment: (NOTE) If result is NEGATIVE SARS-CoV-2 target nucleic acids are NOT DETECTED. The SARS-CoV-2 RNA is generally detectable in upper and lower  respiratory specimens during the acute phase of infection. The lowest  concentration of SARS-CoV-2 viral copies this assay can detect is 250  copies / mL. A negative result does not preclude SARS-CoV-2 infection  and should not be used as the sole basis for treatment or other  patient management decisions.  A negative result may occur with  improper specimen collection / handling, submission of specimen other  than nasopharyngeal swab, presence of viral mutation(s) within the  areas targeted by this assay, and inadequate number of viral copies  (<250 copies / mL). A negative result must be combined with clinical  observations, patient history, and epidemiological information. If result is POSITIVE SARS-CoV-2 target nucleic acids are DETECTED. The SARS-CoV-2 RNA is generally detectable in upper and lower  respiratory specimens dur ing the acute phase of infection.  Positive  results are indicative of active infection with SARS-CoV-2.  Clinical  correlation with patient history and other diagnostic information is  necessary to determine patient infection status.  Positive results do  not rule out bacterial infection or co-infection with other viruses. If result is PRESUMPTIVE POSTIVE SARS-CoV-2 nucleic acids MAY BE PRESENT.   A presumptive positive result was obtained on the submitted  specimen  and confirmed on repeat testing.  While 2019 novel coronavirus  (SARS-CoV-2) nucleic acids may be present in the submitted sample  additional confirmatory testing may be necessary for epidemiological  and / or clinical management purposes  to differentiate between  SARS-CoV-2 and other Sarbecovirus currently known to infect  humans.  If clinically indicated additional testing with an alternate test  methodology [phone]) is advised. The SARS-CoV-2 RNA is generally  detectable in upper and lower respiratory sp ecimens during the acute  phase of infection. The expected result is Negative. Fact Sheet for Patients:  StrictlyIdeas.no Fact Sheet for Healthcare Providers: BankingDealers.co.za This test is not yet approved or cleared by the [place] FDA and has been authorized for detection and/or diagnosis of SARS-CoV-2 by FDA under an Emergency Use Authorization (EUA).  This EUA will remain in effect (meaning this test can be used) for the duration of the COVID-19 declaration under Section 564(b)(1) of the Act, 21 U.S.C. section 360bbb-3(b)(1), unless the authorization is terminated or revoked sooner. Performed at [facility], [address] [place], [place] [address]   MRSA PCR Screening     Status: None   Collection Time: 12/18/18  2:32 PM   Specimen: Nasal Mucosa; Nasopharyngeal  Result Value Ref Range Status   MRSA by PCR NEGATIVE NEGATIVE Final    Comment:        The GeneXpert MRSA Assay (FDA approved for NASAL specimens only), is one component of a comprehensive MRSA colonization surveillance program. It is not intended to diagnose MRSA infection nor to guide or monitor treatment for MRSA infections. Performed at [facility], [address] [place], [place] [address]   Culture, blood (Routine X 2) w Reflex to ID Panel     Status: None (Preliminary result)   Collection Time: 12/24/18  4:20 PM    Specimen: BLOOD  Result Value Ref Range Status   Specimen Description BLOOD LEFT FEMORAL CATH  Final   Special Requests   Final    BOTTLES DRAWN AEROBIC AND ANAEROBIC Blood Culture results may not be optimal due to an excessive volume of blood received in culture bottles   Culture   Final    NO GROWTH < 24 HOURS Performed at [facility], [address] [place], [place] [address]    Report Status PENDING  Incomplete  Culture, blood (Routine X 2) w Reflex to ID Panel     Status: None (Preliminary result)   Collection Time: 12/24/18  4:25 PM   Specimen: BLOOD  Result Value Ref Range Status   Specimen Description BLOOD LEFT FEMORAL CATH  Final   Special Requests   Final    BOTTLES DRAWN AEROBIC AND ANAEROBIC Blood Culture results may not be optimal due to an excessive volume of blood received in culture bottles   Culture   Final    NO GROWTH < 24 HOURS Performed at [facility], [address] [place], [place] [address]    Report Status PENDING  Incomplete      Scheduled Meds: . aspirin EC  81 mg Oral Daily  . Chlorhexidine Gluconate Cloth  6 each Topical Q0600  . docusate sodium  100 mg Oral BID  . epoetin (EPOGEN/PROCRIT) injection  10,000 Units Intravenous Q T,Th,Sa-HD  . feeding supplement (NEPRO CARB STEADY)  237 mL Oral BID BM  . influenza vac split quadrivalent PF  0.5 mL Intramuscular Tomorrow-1000  . isosorbide mononitrate  30 mg Oral Daily  . metoprolol tartrate  25 mg Oral BID  . multivitamin  1 tablet Oral QHS  . polyethylene glycol  17 g Oral Daily  . ticagrelor  90 mg Oral BID  . vancomycin variable dose per unstable renal function (pharmacist dosing)   Does not apply See admin instructions    .  piperacillin-tazobactam (ZOSYN)  IV 3.375 g (12/25/18 0516)  . [START ON 12/26/2018] vancomycin     Assessment/Plan:  1. Acute respiratory failure with shortness of breath secondary to fluid overload.  Patient was dialyzed last night.  Did speak  with nephrology to do dialysis today.  Daily weights on standing scale.  Patient normally ranges between 48 and 50 kg.  Looks like last weights have been 60 kg so we have some room to take off more fluid. 2. Fever.  Temperature curve coming down.  So far blood cultures are negative.  Will empirically give vancomycin and Zosyn.  3. Right thigh hematoma, right superficial femoral artery pseudoaneurysm.  Patient went to the operating for urgent repair on Friday.  Vascular surgery following. 4. End-stage renal disease.  Patient have dialysis today 5. Dialysis access problem with the left fistula.  Will need a angiogram which was rescheduled for tomorrow. 6. CAD with elevated troponin.  STEMI in May.  Similar to previous EKG.  Restart aspirin, Brilinta and metoprolol and Imdur. 7. Acute blood loss anemia with hemoglobin responding to blood transfusion yesterday.  Hemoglobin 8.4.  Recheck labs tomorrow.   Code Status:     Code Status Orders  (From admission, onward)         Start     Ordered   12/18/18 0441  Full code  Continuous     12/18/18 0440        Code Status History    This patient has a current code status but no historical code status.   Advance Care Planning Activity     Family Communication: Spoke with family on the phone. Disposition Plan: To be determined  Consultants:  Critical care specialist  Vascular surgery  Nephrology  Cardiology  Time spent: 29 minutes, through translator.  Case discussed with nursing staff and nephrology  [employer]

## 2018-12-25 NOTE — Progress Notes (Signed)
Nutrition Follow Up Note   DOCUMENTATION CODES:   Underweight  INTERVENTION:   Continue Nepro Shake po BID, each supplement provides 425 kcal and 19 grams protein.  Continue Rena-vite QHS.  NUTRITION DIAGNOSIS:   Increased nutrient needs related to catabolic illness(ESRD on HD) as evidenced by estimated needs.  GOAL:   Patient will meet greater than or equal to 90% of their needs  -progressing   MONITOR:   PO intake, Supplement acceptance, Labs, Weight trends, I & O's  ASSESSMENT:   43 year old male with PMHx of HTN, ESRD on HD admitted with acute hypoxic respiratory failure, hyperkalemia, thrombosis of AV access, acute on chronic anemia.   Pt s/p vascular repair 9/11  Pt with improved appetite and oral intake; pt eating 50-100% of meals and drinking some Nepro. Per chart, pt is weight stable since admit. Last BM documented on 9/8; recommend bowel regimen if needed per MD.   Medications reviewed and include: aspirin, colace, epoetin, rena-vite, brilinta, vancomycin, zosyn   Labs reviewed: Na 133(L), BUN 61(H), creat 10.39(H)- 9/14 P 4.9(H), Mg 1.8 wnl- 9/12 Hgb 8.4(L), Hct 26.7(L)- 9/14   Diet Order:   Diet Order            Diet NPO time specified  Diet effective midnight        Diet renal/carb modified with fluid restriction Diet-HS Snack? Nothing; Fluid restriction: 1200 mL Fluid; Room service appropriate? Yes; Fluid consistency: Thin  Diet effective now             EDUCATION NEEDS:   No education needs have been identified at this time  Skin:  Skin Assessment: Reviewed RN Assessment  Last BM:  12/18/2018 per chart  Height:   Ht Readings from Last 1 Encounters:  12/17/18 6' (1.829 m)   Weight:   Wt Readings from Last 1 Encounters:  12/24/18 60.9 kg   Ideal Body Weight:  80.9 kg  BMI:  Body mass index is 18.21 kg/m.  Estimated Nutritional Needs:   Kcal:  2000-2200  Protein:  90-100 grams  Fluid:  UOP + 1 L  Koleen Distance MS, RD,  LDN Pager #- [phone] Office#- [phone] After Hours Pager: [phone]

## 2018-12-25 NOTE — Plan of Care (Signed)
  Problem: Clinical Measurements: Goal: Respiratory complications will improve 12/25/2018 1547 by Lashara Urey A, RN Outcome: Progressing 12/25/2018 1405 by Latanya Maudlin, RN Outcome: Progressing  Patient reports his breathing has improved after fluid was removed in hemodialysis.

## 2018-12-25 NOTE — Progress Notes (Signed)
PRE HD ASSESSMENT COMPLETE   12/25/18 1400  Neurological  Level of Consciousness Alert  Orientation Level Oriented X4  Respiratory  Respiratory Pattern Regular  Chest Assessment Chest expansion symmetrical  Bilateral Breath Sounds Clear;Diminished  Cardiac  Pulse Regular  Heart Sounds S1, S2  Jugular Venous Distention (JVD) No  ECG Monitor Yes  Cardiac Rhythm NSR  Vascular  R Radial Pulse +2  L Radial Pulse +1  Integumentary  Integumentary (WDL) X  Skin Color Appropriate for ethnicity  Skin Condition Dry  Musculoskeletal  Musculoskeletal (WDL) X  Generalized Weakness Yes  Gastrointestinal  Bowel Sounds Assessment Hypoactive  GU Assessment  Genitourinary (WDL) X  Genitourinary Symptoms Anuria

## 2018-12-25 NOTE — Progress Notes (Signed)
TOLERATED TREATMENT REMOVED 2.5L OF FLUID, STATED THAT HIS BREATHING IS A LOT BETTER, NO PROBLEMS TO NOTE     12/25/18 1719  Hand-Off documentation  Report given to (Full Name) Josh Simser  Report received from (Full Name) Sherren Mocha  Vital Signs  Temp (!) 97.4 F (36.3 C)  Temp Source Oral  Pulse Rate 96  Pulse Rate Source Monitor  Resp (!) 23  BP 114/84  BP Location Right Arm  BP Method Automatic  Patient Position (if appropriate) Lying  Oxygen Therapy  SpO2 95 %  O2 Device Room Air  Pain Assessment  Pain Scale 0-10  Pain Score 0  Dialysis Weight  Weight 55.5 kg  Type of Weight Post-Dialysis  During Hemodialysis Assessment  Blood Flow Rate (mL/min) 150 mL/min  Arterial Pressure (mmHg) -10 mmHg  Venous Pressure (mmHg) 50 mmHg  Transmembrane Pressure (mmHg) 60 mmHg  Ultrafiltration Rate (mL/min) 0 mL/min  Dialysate Flow Rate (mL/min) 800 ml/min  Conductivity: Machine  14.1  HD Safety Checks Performed Yes  KECN 58.9 KECN  Dialysis Fluid Bolus Normal Saline  Bolus Amount (mL) 250 mL  Intra-Hemodialysis Comments Tolerated well;Tx completed  Post-Hemodialysis Assessment  Rinseback Volume (mL) 250 mL  KECN 58.9 V  Dialyzer Clearance Clear  Duration of HD Treatment -hour(s) 3 hour(s)  Hemodialysis Intake (mL) 500 mL  UF Total -Machine (mL) 3000 mL  Net UF (mL) 2500 mL  Tolerated HD Treatment Yes  Post-Hemodialysis Comments uf goal met no distress to note  Education / Care Plan  Dialysis Education Provided Yes  Documented Education in Care Plan Yes  Outpatient Plan of Care Reviewed and on Chart Yes  Hemodialysis Catheter Left Femoral vein Triple lumen Temporary (Non-Tunneled)  Placement Date/Time: 12/18/18 1222   Placed prior to admission: No  Time Out: Correct patient;Correct procedure;Correct site  Maximum sterile barrier precautions: Hand hygiene;Sterile gown;Sterile gloves;Cap;Mask;Large sterile sheet  Site Prep: Chlorh...  Site Condition No complications   Blue Lumen Status Flushed;Heparin locked  Red Lumen Status Flushed;Heparin locked  Catheter fill solution Heparin 1000 units/ml  Catheter fill volume (Arterial) 1.8 cc  Catheter fill volume (Venous) 1.8  Dressing Type Biopatch;Occlusive  Dressing Status Clean;Dry;Intact  Drainage Description None  Post treatment catheter status Capped and Clamped

## 2018-12-25 NOTE — Plan of Care (Signed)
  Problem: Clinical Measurements: Goal: Respiratory complications will improve Outcome: Progressing   Problem: Coping: Goal: Level of anxiety will decrease Outcome: Progressing  Patient anxious about Jp drain and the dressing. Nursing and MD provided assurance and educated on what the treatment plan was.

## 2018-12-26 ENCOUNTER — Inpatient Hospital Stay: Payer: Medicaid Other | Admitting: Certified Registered"

## 2018-12-26 ENCOUNTER — Encounter: Payer: Self-pay | Admitting: Anesthesiology

## 2018-12-26 ENCOUNTER — Encounter
Admission: EM | Disposition: A | Discharge status: Home / Self Care | Payer: Self-pay | Source: Home / Self Care | Attending: Internal Medicine

## 2018-12-26 DIAGNOSIS — T82898A Other specified complication of vascular prosthetic devices, implants and grafts, initial encounter: Secondary | ICD-10-CM

## 2018-12-26 DIAGNOSIS — Z992 Dependence on renal dialysis: Secondary | ICD-10-CM

## 2018-12-26 DIAGNOSIS — N186 End stage renal disease: Secondary | ICD-10-CM

## 2018-12-26 HISTORY — PX: A/V SHUNT INTERVENTION: CATH118220

## 2018-12-26 LAB — CBC
HCT: 24.9 % — ABNORMAL LOW (ref 39.0–52.0)
Hemoglobin: 7.9 g/dL — ABNORMAL LOW (ref 13.0–17.0)
MCH: 29.7 pg (ref 26.0–34.0)
MCHC: 31.7 g/dL (ref 30.0–36.0)
MCV: 93.6 fL (ref 80.0–100.0)
Platelets: 168 10*3/uL (ref 150–400)
RBC: 2.66 MIL/uL — ABNORMAL LOW (ref 4.22–5.81)
RDW: 16.8 % — ABNORMAL HIGH (ref 11.5–15.5)
WBC: 7.6 10*3/uL (ref 4.0–10.5)
nRBC: 0 % (ref 0.0–0.2)

## 2018-12-26 LAB — BASIC METABOLIC PANEL
Anion gap: 13 (ref 5–15)
BUN: 40 mg/dL — ABNORMAL HIGH (ref 6–20)
CO2: 26 mmol/L (ref 22–32)
Calcium: 7.1 mg/dL — ABNORMAL LOW (ref 8.9–10.3)
Chloride: 98 mmol/L (ref 98–111)
Creatinine, Ser: 6.89 mg/dL — ABNORMAL HIGH (ref 0.61–1.24)
GFR calc Af Amer: 10 mL/min — ABNORMAL LOW (ref >60)
GFR calc non Af Amer: 9 mL/min — ABNORMAL LOW (ref >60)
Glucose, Bld: 110 mg/dL — ABNORMAL HIGH (ref 70–99)
Potassium: 4.3 mmol/L (ref 3.5–5.1)
Sodium: 137 mmol/L (ref 135–145)

## 2018-12-26 SURGERY — A/V SHUNT INTERVENTION
Anesthesia: General | Laterality: Left

## 2018-12-26 MED ORDER — SODIUM CHLORIDE 0.9 % IV SOLN
INTRAVENOUS | Status: DC
  Administered 2018-12-26: 12:00:00 via INTRAVENOUS

## 2018-12-26 MED ORDER — EPHEDRINE SULFATE 50 MG/ML IJ SOLN
5.0000 mg | Freq: Once | INTRAMUSCULAR | Status: AC
  Administered 2018-12-26: 14:00:00 5 mg via INTRAVENOUS

## 2018-12-26 MED ORDER — HYDROMORPHONE HCL 1 MG/ML IJ SOLN: 1.0000 mg | Freq: Once | INTRAMUSCULAR | Status: DC | PRN

## 2018-12-26 MED ORDER — MIDAZOLAM HCL 2 MG/2ML IJ SOLN
INTRAMUSCULAR | Status: AC
  Filled 2018-12-26: qty 2

## 2018-12-26 MED ORDER — PHENYLEPHRINE HCL (PRESSORS) 10 MG/ML IV SOLN
INTRAVENOUS | Status: DC | PRN
  Administered 2018-12-26: 200 ug via INTRAVENOUS
  Administered 2018-12-26: 100 ug via INTRAVENOUS
  Administered 2018-12-26 (×3): 200 ug via INTRAVENOUS

## 2018-12-26 MED ORDER — PROPOFOL 500 MG/50ML IV EMUL
INTRAVENOUS | Status: AC
  Filled 2018-12-26: qty 50

## 2018-12-26 MED ORDER — SODIUM CHLORIDE 0.9 % IV SOLN: INTRAVENOUS | Status: DC | PRN

## 2018-12-26 MED ORDER — VANCOMYCIN HCL 500 MG IV SOLR
500.0000 mg | INTRAVENOUS | Status: DC
  Administered 2018-12-27: 500 mg via INTRAVENOUS
  Filled 2018-12-26: qty 500

## 2018-12-26 MED ORDER — VANCOMYCIN HCL 500 MG IV SOLR
500.0000 mg | Freq: Once | INTRAVENOUS | Status: AC
  Administered 2018-12-26: 15:00:00 500 mg via INTRAVENOUS
  Filled 2018-12-26: qty 500

## 2018-12-26 MED ORDER — EPHEDRINE SULFATE 50 MG/ML IJ SOLN
INTRAMUSCULAR | Status: AC
  Administered 2018-12-26: 5 mg via INTRAVENOUS
  Filled 2018-12-26: qty 1

## 2018-12-26 MED ORDER — MIDAZOLAM HCL 2 MG/2ML IJ SOLN
INTRAMUSCULAR | Status: DC | PRN
  Administered 2018-12-26: 2 mg via INTRAVENOUS

## 2018-12-26 MED ORDER — FENTANYL CITRATE (PF) 100 MCG/2ML IJ SOLN
INTRAMUSCULAR | Status: AC
  Filled 2018-12-26: qty 2

## 2018-12-26 MED ORDER — DEXMEDETOMIDINE HCL IN NACL 200 MCG/50ML IV SOLN
INTRAVENOUS | Status: DC | PRN
  Administered 2018-12-26: 8 ug via INTRAVENOUS

## 2018-12-26 MED ORDER — CEFAZOLIN SODIUM-DEXTROSE 1-4 GM/50ML-% IV SOLN
INTRAVENOUS | Status: AC
  Filled 2018-12-26: qty 50

## 2018-12-26 MED ORDER — FENTANYL CITRATE (PF) 100 MCG/2ML IJ SOLN
INTRAMUSCULAR | Status: DC | PRN
  Administered 2018-12-26: 50 ug via INTRAVENOUS

## 2018-12-26 MED ORDER — PROPOFOL 10 MG/ML IV BOLUS
INTRAVENOUS | Status: AC
  Filled 2018-12-26: qty 20

## 2018-12-26 MED ORDER — PROPOFOL 500 MG/50ML IV EMUL
INTRAVENOUS | Status: DC | PRN
  Administered 2018-12-26: 100 ug/kg/min via INTRAVENOUS

## 2018-12-26 MED ORDER — DIPHENHYDRAMINE HCL 50 MG/ML IJ SOLN: 50.0000 mg | Freq: Once | INTRAMUSCULAR | Status: DC | PRN

## 2018-12-26 MED ORDER — METHYLPREDNISOLONE SODIUM SUCC 125 MG IJ SOLR: 125.0000 mg | Freq: Once | INTRAMUSCULAR | Status: DC | PRN

## 2018-12-26 MED ORDER — ONDANSETRON HCL 4 MG/2ML IJ SOLN: 4.0000 mg | Freq: Four times a day (QID) | INTRAMUSCULAR | Status: DC | PRN

## 2018-12-26 MED ORDER — SODIUM CHLORIDE 0.9 % IV SOLN
INTRAVENOUS | Status: DC | PRN
  Administered 2018-12-26: 50 ug/min via INTRAVENOUS

## 2018-12-26 MED ORDER — CEFAZOLIN SODIUM-DEXTROSE 1-4 GM/50ML-% IV SOLN
1.0000 g | Freq: Once | INTRAVENOUS | Status: AC
  Administered 2018-12-26: 1 g via INTRAVENOUS
  Filled 2018-12-26: qty 50

## 2018-12-26 MED ORDER — FAMOTIDINE 20 MG PO TABS: 40.0000 mg | ORAL_TABLET | Freq: Once | ORAL | Status: DC | PRN

## 2018-12-26 MED ORDER — MIDAZOLAM HCL 2 MG/ML PO SYRP
8.0000 mg | ORAL_SOLUTION | Freq: Once | ORAL | Status: DC | PRN
  Filled 2018-12-26: qty 4

## 2018-12-26 MED ORDER — HEPARIN SODIUM (PORCINE) 1000 UNIT/ML IJ SOLN
INTRAMUSCULAR | Status: DC | PRN
  Administered 2018-12-26: 3000 [IU] via INTRAVENOUS

## 2018-12-26 MED ORDER — IODIXANOL 320 MG/ML IV SOLN
INTRAVENOUS | Status: DC | PRN
  Administered 2018-12-26: 40 mL via INTRAVENOUS

## 2018-12-26 SURGICAL SUPPLY — 10 items
BALLN DORADO 8X60X80 (BALLOONS) ×3
BALLN LUTONIX AV 8X60X75 (BALLOONS) ×3
CANNULA 5F STIFF (CANNULA) ×3
COVER PROBE U/S 5X48 (MISCELLANEOUS) ×3
DEVICE PRESTO INFLATION (MISCELLANEOUS) ×3
DRAPE BRACHIAL (DRAPES) ×3
PACK ANGIOGRAPHY (CUSTOM PROCEDURE TRAY) ×3
SHEATH BRITE TIP 6FRX5.5 (SHEATH) ×3
SUT MNCRL AB 4-0 PS2 18 (SUTURE) ×3
WIRE MAGIC TOR.035 180C (WIRE) ×3

## 2018-12-26 NOTE — H&P (Signed)
[facility] History & Physical Update  The patient was interviewed and re-examined.  The patient's previous History and Physical has been reviewed and is unchanged.  There is no change in the plan of care. We plan to proceed with the scheduled procedure.  Leotis Pain, MD  12/26/2018, 11:56 AM

## 2018-12-26 NOTE — Progress Notes (Signed)
Pharmacy Antibiotic Note  Zedrick Whisler is a 43 y.o. male admitted on 12/17/2018 with a wound infection. Pharmacy was consulted for Vancomycin and Zosyn dosing. This is day #3 of IV antibiotics, POD#5 right SFA pseudoaneurysm repair, hematoma evacuation, leukocytosis resolved. He received vancomycin 1500 mg as loading dose on 9/14. His first HD session as an inpatient was 9/15. Since the original pharmacy note the patient's weight has been updated and the maintenance dose has been adjusted.  Plan: 1) continue Zosyn 3.375gm EI IV q12h for Hemodialysis dosing  2) continue vancomycin 500 mg with HD schedule TTS  check Vanc level prior to 3rd HD session  Goal pre-HD level: 15-25 mcg/mL   Height: 6' (182.9 cm) Weight: 127 lb 6.8 oz (57.8 kg) IBW/kg (Calculated) : 77.6  Temp (24hrs), Avg:98.7 F (37.1 C), Min:97.4 F (36.3 C), Max:100.1 F (37.8 C)  Recent Labs  Lab 12/21/18 0616 12/21/18 1846 12/22/18 0358 12/23/18 0446 12/24/18 0817 12/26/18 0346  WBC 7.1 8.3 6.5 5.8 7.9 7.6  CREATININE 6.86*  --  8.97* 7.52* 10.39* 6.89*    Estimated Creatinine Clearance: 11.3 mL/min (A) (by C-G formula based on SCr of 6.89 mg/dL (H)).    No Known Allergies  Antimicrobials this admission: Vanc 9/14 >>   Zosyn 9/14 >>    Microbiology results: 9/14 BCx:  NGTD 9/8 SARS CoV-2: negative 9/8 MRSA PCR: negative  Thank you for allowing pharmacy to be a part of this patient's care.  Dallie Piles, PharmD 12/26/2018 10:08 AM

## 2018-12-26 NOTE — Progress Notes (Signed)
Informed Dr Randa Lynn BP 82/55 MAP 64.  Providers stated to continue monitoring pt.  No new orders at this time.

## 2018-12-26 NOTE — Anesthesia Preprocedure Evaluation (Addendum)
Anesthesia Evaluation  Patient identified by MRN, date of birth, ID band Patient awake    Reviewed: Allergy & Precautions, H&P , NPO status , Patient's Chart, lab work & pertinent test results  History of Anesthesia Complications Negative for: history of anesthetic complications  Airway Mallampati: II  TM Distance: >3 FB Neck ROM: full    Dental  (+) Chipped, Poor Dentition   Pulmonary neg pulmonary ROS, neg shortness of breath, neg sleep apnea, neg COPD,    breath sounds clear to auscultation- rhonchi (-) wheezing      Cardiovascular Exercise Tolerance: Good hypertension, + CAD and + Past MI (STEMI 08/2018 found to have diffuse small vessel disease, not stented)  (-) Cardiac Stents  Rhythm:Regular Rate:Normal - Systolic murmurs and - Diastolic murmurs    Neuro/Psych neg Seizures negative neurological ROS  negative psych ROS   GI/Hepatic negative GI ROS, Neg liver ROS,   Endo/Other  negative endocrine ROSneg diabetes  Renal/GU DialysisRenal disease     Musculoskeletal negative musculoskeletal ROS (+)   Abdominal   Peds  Hematology negative hematology ROS (+)   Anesthesia Other Findings Past Medical History: No date: ESRD (end stage renal disease) on dialysis (HCC) No date: Hypertension No date: Renal disorder  Past Surgical History: 12/18/2018: TEMPORARY DIALYSIS CATHETER; Left     Comment:  Procedure: TEMPORARY DIALYSIS CATHETER;  Surgeon:               Katha Cabal, MD;  Location: [facility];               Service: Cardiovascular;  Laterality: Left;  BMI    Body Mass Index: 18.09 kg/m      Reproductive/Obstetrics negative OB ROS                            Anesthesia Physical  Anesthesia Plan  ASA: IV and emergent  Anesthesia Plan: General ETT   Post-op Pain Management:    Induction: Intravenous  PONV Risk Score and Plan: 1 and TIVA and Propofol  infusion  Airway Management Planned: Nasal Cannula  Additional Equipment:   Intra-op Plan:   Post-operative Plan: Possible Post-op intubation/ventilation  Informed Consent: I have reviewed the patients History and Physical, chart, labs and discussed the procedure including the risks, benefits and alternatives for the proposed anesthesia with the patient or authorized representative who has indicated his/her understanding and acceptance.     Dental Advisory Given  Plan Discussed with: Anesthesiologist, CRNA and Surgeon  Anesthesia Plan Comments: (Patient consented for risks of anesthesia including but not limited to:  - adverse reactions to medications - damage to teeth, lips or other oral mucosa - sore throat or hoarseness - Damage to heart, brain, lungs or loss of life  Patient voiced understanding.)       Anesthesia Quick Evaluation

## 2018-12-26 NOTE — Op Note (Signed)
[facility]    OPERATIVE NOTE   PROCEDURE: 1.   Left radiocephalic arteriovenous fistula cannulation under ultrasound guidance 2.   Left arm fistulagram including central venogram 3.   Percutaneous transluminal angioplasty of the proximal left forearm cephalic vein with 8 mm diameter drug-coated and 8 mm diameter high-pressure angioplasty balloon  PRE-OPERATIVE DIAGNOSIS: 1. ESRD 2. Poorly functional aneurysmal left radiocephalic AVF  POST-OPERATIVE DIAGNOSIS: same as above   SURGEON: Leotis Pain, MD  ANESTHESIA: local with MCS  ESTIMATED BLOOD LOSS: 2 cc  FINDING(S): 1. High-grade, near occlusive stenosis of the proximal forearm cephalic vein just proximal to the access sites of greater than 90%.  There was dual outflow in the upper arm although the cephalic vein appeared to be the dominant outflow.  The central venous circulation was widely patent and there were no other significant stenoses.  The access sites were markedly aneurysmal.  SPECIMEN(S):  None  CONTRAST: 40 cc  FLUORO TIME: 2.4 minutes  Anesthesia: MAC  INDICATIONS: Ralph Lucero is a 43 y.o. male who presents with malfunctioning left radiocephalic arteriovenous fistula.  The patient is scheduled for left arm fistulagram.  The patient is aware the risks include but are not limited to: bleeding, infection, thrombosis of the cannulated access, and possible anaphylactic reaction to the contrast.  The patient is aware of the risks of the procedure and elects to proceed forward.  DESCRIPTION: After full informed written consent was obtained, the patient was brought back to the angiography suite and placed supine upon the angiography table.  The patient was connected to monitoring equipment. Anesthesia provided heavy sedation. The left arm was prepped and draped in the standard fashion for a percutaneous access intervention.  Under ultrasound guidance, the left radiocephalic arteriovenous  fistula was cannulated with a micropuncture needle under direct ultrasound guidance where it was patent and a permanent image was performed.  The microwire was advanced into the fistula and the needle was exchanged for the a microsheath.  I then upsized to a 6 Fr Sheath and imaging was performed.  Hand injections were completed to image the access including the central venous system. This demonstrated high-grade, near occlusive stenosis of the proximal forearm cephalic vein just proximal to the access sites of greater than 90%.  There was dual outflow in the upper arm although the cephalic vein appeared to be the dominant outflow.  The central venous circulation was widely patent and there were no other significant stenoses.  The access sites were markedly aneurysmal.  Based on the images, this patient will need intervention to this high-grade stenosis to salvage the fistula. I then gave the patient 3000 units of intravenous heparin.  I then crossed the stenosis with a Magic Tourqe wire.  Based on the imaging, a 8 mm x 6 cm Lutonix drug-coated angioplasty balloon was selected.  The balloon was centered around the proximal forearm cephalic vein stenosis and inflated to 8 ATM for 1 minute(s) at the waist did not entirely resolve and there was still high-grade residual stenosis present.  I then exchanged for an 8 mm diameter by 6 cm length high-pressure angioplasty balloon and inflated this to 20 atm for 1 minute.  On completion imaging, a 15-20 % residual stenosis was present.     Based on the completion imaging, no further intervention is necessary.  The wire and balloon were removed from the sheath.  A 4-0 Monocryl purse-string suture was sewn around the sheath.  The sheath was removed while  tying down the suture.  A sterile bandage was applied to the puncture site.  COMPLICATIONS: None  CONDITION: Stable   Leotis Pain  12/26/2018 12:56 PM   This note was created with Dragon Medical transcription  system. Any errors in dictation are purely unintentional.

## 2018-12-26 NOTE — Progress Notes (Deleted)
Pt had episode of chest pain in dialysis center. Dialysis RN called in a rapid response team. Pt was transported to ICU 12. Report given to [facility], ICU RN.

## 2018-12-26 NOTE — Progress Notes (Signed)
Left arm fistula assessed. Present thrill and bruit. Consent obtained at bedside with interpreter Ronnald Collum. Pt consents to fistulogram with possible intervention. Pt REFUSES Perm Cath placement today.

## 2018-12-26 NOTE — TOC Initial Note (Signed)
Transition of Care [facility]) - Initial/Assessment Note    Patient Details  Name: Ralph Lucero MRN: [id number] Date of Birth: [birth year]  [facility] CM/SW Contact:    Beverly Sessions, RN Phone Number: 12/26/2018, 1:52 PM  Clinical Narrative:                  Patient to be seen by [facility] for consult for medication needs Patient currently off the floor for procedure  Per Elvera Bicker [facility] [place] patient has emergency Medicaid for HD only Patient is undocumented. PT eval pending  Per Estill Bamberg Patient has previously received charity care from [facility].        Patient Goals and CMS Choice        Expected Discharge Plan and Services           Expected Discharge Date: 12/24/18                                    Prior Living Arrangements/Services                       Activities of Daily Living Home Assistive Devices/Equipment: None ADL Screening (condition at time of admission) Patient's cognitive ability adequate to safely complete daily activities?: No Is the patient deaf or have difficulty hearing?: No Does the patient have difficulty seeing, even when wearing glasses/contacts?: No Does the patient have difficulty concentrating, remembering, or making decisions?: No Patient able to express need for assistance with ADLs?: No Does the patient have difficulty dressing or bathing?: No Independently performs ADLs?: No Communication: Independent Dressing (OT): Independent Grooming: Independent Feeding: Independent Bathing: Independent Toileting: Independent In/Out Bed: Needs assistance Is this a change from baseline?: Pre-admission baseline Walks in Home: Independent Does the patient have difficulty walking or climbing stairs?: Yes Weakness of Legs: Right Weakness of Arms/Hands: None  Permission Sought/Granted                  Emotional Assessment              Admission diagnosis:  Acute pulmonary edema ([place])  [J81.0] Acute respiratory failure with hypoxia ([place]) [J96.01] Patient Active Problem List   Diagnosis Date Noted  . Acute on chronic respiratory failure with hypoxemia ([place]) 12/18/2018   PCP:  Anthonette Legato, MD Pharmacy:   [id number] - [place], [place] - [address] [address] Phone: [phone] Fax: [phone]     Social Determinants of Health (SDOH) Interventions    Readmission Risk Interventions No flowsheet data found.

## 2018-12-26 NOTE — Anesthesia Post-op Follow-up Note (Signed)
Anesthesia QCDR form completed.        

## 2018-12-26 NOTE — Transfer of Care (Signed)
Immediate Anesthesia Transfer of Care Note  Patient: Ralph Lucero  Procedure(s) Performed: A/V SHUNT INTERVENTION WITH PERMCATH INSERTION (Left )  Patient Location: PACU  Anesthesia Type:General  Level of Consciousness: drowsy  Airway & Oxygen Therapy: Patient Spontanous Breathing and Patient connected to face mask oxygen  Post-op Assessment: Report given to RN and Post -op Vital signs reviewed and unstable, Anesthesiologist notified  Post vital signs: Reviewed  Last Vitals:  Vitals Value Taken Time  BP 87/54 12/26/18 1315  Temp 36.5 C 12/26/18 1307  Pulse 66 12/26/18 1319  Resp 14 12/26/18 1319  SpO2 100 % 12/26/18 1319  Vitals shown include unvalidated device data.  Last Pain:  Vitals:   12/26/18 1307  TempSrc:   PainSc: 0-No pain      Patients Stated Pain Goal: 0 ([id number])  Complications: No apparent anesthesia complications

## 2018-12-26 NOTE — Progress Notes (Signed)
Bruit and thrill present in left arm fistula

## 2018-12-26 NOTE — Progress Notes (Signed)
PT Cancellation Note  Patient Details Name: Ralph Lucero MRN: [id number] DOB: June 26, 1975   Cancelled Treatment:    Reason Eval/Treat Not Completed: Medical issues which prohibited therapy(Continues with L temp fem cath in place. Per nephrology, scheduled for access procedure this date.  If successful, temp fem cath will be discontinued and patient will be appropriate for mobility.  Will continue to follow and initiate full mobility assessment as medically appropriate.)   Dave Mergen H. Owens Shark, PT, DPT, NCS 12/26/18, 11:06 AM [phone]

## 2018-12-26 NOTE — Progress Notes (Signed)
Patient ID: Ralph Lucero, male   DOB: 09/13/1975, 43 y.o.   MRN: [id number]  [facility] PROGRESS NOTE  Jeycob Charette [id number] DOB: 07-19-1975 DOA: 12/17/2018 PCP: Anthonette Legato, MD  HPI/Subjective: Patient feeling much better with regards to his breathing. Patient going for fistulogram today.  Had low-grade fever last night.  Objective: Vitals:   12/26/18 1311 12/26/18 1315  BP: 101/71 (!) 87/54  Pulse: 69 69  Resp: 13 15  Temp:    SpO2: 100% 100%    Filed Weights   12/25/18 1719 12/26/18 0500 12/26/18 1148  Weight: 55.5 kg 57.8 kg 59 kg    ROS: Review of Systems  Constitutional: Negative for chills and fever.  Eyes: Negative for blurred vision.  Respiratory: Negative for cough and shortness of breath.   Cardiovascular: Negative for chest pain.  Gastrointestinal: Negative for abdominal pain, constipation, diarrhea, nausea and vomiting.  Genitourinary: Negative for dysuria.  Musculoskeletal: Positive for joint pain.  Neurological: Negative for dizziness and headaches.   Exam: Physical Exam  Constitutional: He is oriented to person, place, and time.  HENT:  Nose: No mucosal edema.  Mouth/Throat: No oropharyngeal exudate or posterior oropharyngeal edema.  Eyes: Pupils are equal, round, and reactive to light. Conjunctivae, EOM and lids are normal.  Neck: No JVD present. Carotid bruit is not present. No edema present. No thyroid mass and no thyromegaly present.  Cardiovascular: S1 normal and S2 normal. Exam reveals no gallop.  No murmur heard. Pulses:      Dorsalis pedis pulses are 2+ on the right side and 2+ on the left side.  Respiratory: No respiratory distress. He has decreased breath sounds in the right lower field and the left lower field. He has no wheezes. He has no rhonchi. He has rales in the right lower field and the left lower field.  GI: Soft. Bowel sounds are normal. There is no abdominal tenderness.  Genitourinary:     Genitourinary Comments: Scrotal swelling to twice the size.   Musculoskeletal:     Right ankle: He exhibits no swelling.     Left ankle: He exhibits no swelling.  Lymphadenopathy:    He has no cervical adenopathy.  Neurological: He is alert and oriented to person, place, and time. No cranial nerve deficit.  Skin: Skin is warm. No rash noted. Nails show no clubbing.  Right groin covered  Psychiatric: He has a normal mood and affect.      Data Reviewed: Basic Metabolic Panel: Recent Labs  Lab 12/20/18 0432 12/21/18 0616 12/21/18 1455 12/22/18 0358 12/23/18 0446 12/24/18 0817 12/26/18 0346  NA 139 138  --  133* 137 133* 137  K 4.3 4.4  --  5.1 4.1 5.0 4.3  CL 100 100  --  98 100 96* 98  CO2 28 27  --  25 27 20* 26  GLUCOSE 119* 141* 117* 140* 98 95 110*  BUN 34* 25*  --  38* 37* 61* 40*  CREATININE 9.07* 6.86*  --  8.97* 7.52* 10.39* 6.89*  CALCIUM 7.5* 7.7*  --  6.4* 6.9* 6.8* 7.1*  MG  --   --   --  1.8  --   --   --   PHOS 4.5  --   --  4.9*  --   --   --    Liver Function Tests: Recent Labs  Lab 12/20/18 0432 12/22/18 0358  AST  --  34  ALT  --  16  ALKPHOS  --  59  BILITOT  --  0.4  PROT  --  5.2*  ALBUMIN 3.3* 2.8*   CBC: Recent Labs  Lab 12/20/18 0100  12/21/18 0616 12/21/18 1846 12/22/18 0358 12/23/18 0446 12/24/18 0817 12/26/18 0346  WBC 7.1   < > 7.1 8.3 6.5 5.8 7.9 7.6  NEUTROABS 5.1  --  5.2  --   --   --   --   --   HGB 7.0*   < > 8.1* 5.3* 7.3* 6.6* 8.4* 7.9*  HCT 21.2*   < > 24.7* 16.4* 22.2* 20.5* 26.7* 24.9*  MCV 95.5   < > 96.5 97.6 89.2 91.1 95.7 93.6  PLT 90*   < > 69* 83* 64* 83* PLATELET CLUMPS NOTED ON SMEAR, UNABLE TO ESTIMATE 168   < > = values in this interval not displayed.   BNP (last 3 results) Recent Labs    12/17/18 2348  BNP 1,555.0*     CBG: Recent Labs  Lab 12/22/18 0743 12/23/18 0850 12/23/18 1221 12/23/18 1817 12/24/18 0731  GLUCAP 96 53* 67* 65* 83    Recent Results (from the past 240 hour(s))   SARS Coronavirus 2 [facility] order, Performed in [facility] hospital lab) Nasopharyngeal Nasopharyngeal Swab     Status: None   Collection Time: 12/18/18 12:31 AM   Specimen: Nasopharyngeal Swab  Result Value Ref Range Status   SARS Coronavirus 2 NEGATIVE NEGATIVE Final    Comment: (NOTE) If result is NEGATIVE SARS-CoV-2 target nucleic acids are NOT DETECTED. The SARS-CoV-2 RNA is generally detectable in upper and lower  respiratory specimens during the acute phase of infection. The lowest  concentration of SARS-CoV-2 viral copies this assay can detect is 250  copies / mL. A negative result does not preclude SARS-CoV-2 infection  and should not be used as the sole basis for treatment or other  patient management decisions.  A negative result may occur with  improper specimen collection / handling, submission of specimen other  than nasopharyngeal swab, presence of viral mutation(s) within the  areas targeted by this assay, and inadequate number of viral copies  (<250 copies / mL). A negative result must be combined with clinical  observations, patient history, and epidemiological information. If result is POSITIVE SARS-CoV-2 target nucleic acids are DETECTED. The SARS-CoV-2 RNA is generally detectable in upper and lower  respiratory specimens dur ing the acute phase of infection.  Positive  results are indicative of active infection with SARS-CoV-2.  Clinical  correlation with patient history and other diagnostic information is  necessary to determine patient infection status.  Positive results do  not rule out bacterial infection or co-infection with other viruses. If result is PRESUMPTIVE POSTIVE SARS-CoV-2 nucleic acids MAY BE PRESENT.   A presumptive positive result was obtained on the submitted specimen  and confirmed on repeat testing.  While 2019 novel coronavirus  (SARS-CoV-2) nucleic acids may be present in the submitted sample  additional confirmatory testing may be  necessary for epidemiological  and / or clinical management purposes  to differentiate between  SARS-CoV-2 and other Sarbecovirus currently known to infect humans.  If clinically indicated additional testing with an alternate test  methodology [phone]) is advised. The SARS-CoV-2 RNA is generally  detectable in upper and lower respiratory sp ecimens during the acute  phase of infection. The expected result is Negative. Fact Sheet for Patients:  StrictlyIdeas.no Fact Sheet for Healthcare Providers: BankingDealers.co.za This test is not yet approved or cleared by the [place] FDA and has been  authorized for detection and/or diagnosis of SARS-CoV-2 by FDA under an Emergency Use Authorization (EUA).  This EUA will remain in effect (meaning this test can be used) for the duration of the COVID-19 declaration under Section 564(b)(1) of the Act, 21 U.S.C. section 360bbb-3(b)(1), unless the authorization is terminated or revoked sooner. Performed at [facility], [address] [place], [place] [address]   MRSA PCR Screening     Status: None   Collection Time: 12/18/18  2:32 PM   Specimen: Nasal Mucosa; Nasopharyngeal  Result Value Ref Range Status   MRSA by PCR NEGATIVE NEGATIVE Final    Comment:        The GeneXpert MRSA Assay (FDA approved for NASAL specimens only), is one component of a comprehensive MRSA colonization surveillance program. It is not intended to diagnose MRSA infection nor to guide or monitor treatment for MRSA infections. Performed at [facility], [address] [place], [place] [address]   Culture, blood (Routine X 2) w Reflex to ID Panel     Status: None (Preliminary result)   Collection Time: 12/24/18  4:20 PM   Specimen: BLOOD  Result Value Ref Range Status   Specimen Description BLOOD LEFT FEMORAL CATH  Final   Special Requests   Final    BOTTLES DRAWN AEROBIC AND ANAEROBIC Blood  Culture results may not be optimal due to an excessive volume of blood received in culture bottles   Culture   Final    NO GROWTH 2 DAYS Performed at [facility], [address], [place], [place] [address]    Report Status PENDING  Incomplete  Culture, blood (Routine X 2) w Reflex to ID Panel     Status: None (Preliminary result)   Collection Time: 12/24/18  4:25 PM   Specimen: BLOOD  Result Value Ref Range Status   Specimen Description BLOOD LEFT FEMORAL CATH  Final   Special Requests   Final    BOTTLES DRAWN AEROBIC AND ANAEROBIC Blood Culture results may not be optimal due to an excessive volume of blood received in culture bottles   Culture   Final    NO GROWTH 2 DAYS Performed at [facility], [address] [place], [place] [address]    Report Status PENDING  Incomplete      Scheduled Meds: . [MAR Hold] aspirin EC  81 mg Oral Daily  . [MAR Hold] Chlorhexidine Gluconate Cloth  6 each Topical Q0600  . [MAR Hold] docusate sodium  100 mg Oral BID  . [MAR Hold] epoetin (EPOGEN/PROCRIT) injection  10,000 Units Intravenous Q T,Th,Sa-HD  . [MAR Hold] feeding supplement (NEPRO CARB STEADY)  237 mL Oral BID BM  . [MAR Hold] influenza vac split quadrivalent PF  0.5 mL Intramuscular Tomorrow-1000  . [MAR Hold] isosorbide mononitrate  30 mg Oral Daily  . [MAR Hold] metoprolol tartrate  25 mg Oral BID  . [MAR Hold] multivitamin  1 tablet Oral QHS  . [MAR Hold] ticagrelor  90 mg Oral BID    . sodium chloride 10 mL/hr at 12/26/18 1152  . [MAR Hold] piperacillin-tazobactam (ZOSYN)  IV 3.375 g (12/26/18 0422)  . [MAR Hold] vancomycin    . [MAR Hold] vancomycin     Assessment/Plan:  1. Fever.  Temperature curve coming down.  So far blood cultures are negative.  Will empirically give vancomycin and Zosyn.  Could be from the hematoma in the right thigh. 2. Acute hypoxic respiratory failure secondary to fluid overload.  This has  resolved with extra dialysis on Monday  and routine dialysis yesterday. 3. Right thigh hematoma, right superficial femoral artery pseudoaneurysm.  Patient went to the operating for urgent repair on Friday.  Vascular surgery following. 4. End-stage renal disease.  Dialysis as per nephrology 5. Dialysis access problem with the left fistula.  Fistulogram today.  Patient will need a working dialysis prior to disposition. 6. CAD with elevated troponin.  STEMI in May.  Similar to previous EKG.  Restarted aspirin, Brilinta and metoprolol and Imdur. 7. Acute blood loss anemia with hemoglobin responding to blood transfusion 2 days ago.  Hemoglobin 7.9.    Code Status:     Code Status Orders  (From admission, onward)         Start     Ordered   12/18/18 0441  Full code  Continuous     12/18/18 0440        Code Status History    This patient has a current code status but no historical code status.   Advance Care Planning Activity     Family Communication: Spoke with brother Dellis Filbert on the phone Disposition Plan: To be determined  Consultants:  Critical care specialist  Vascular surgery  Nephrology  Cardiology  Time spent: 27 minutes, through translator.  Case discussed with nursing staff and nephrology  [facility]

## 2018-12-27 LAB — LIPID PANEL
Cholesterol: 157 mg/dL (ref 0–200)
HDL: 32 mg/dL — ABNORMAL LOW (ref >40)
LDL Cholesterol: 92 mg/dL (ref 0–99)
Total CHOL/HDL Ratio: 4.9 RATIO
Triglycerides: 164 mg/dL — ABNORMAL HIGH (ref <150)
VLDL: 33 mg/dL (ref 0–40)

## 2018-12-27 NOTE — Progress Notes (Signed)
HD Tx Completed:    12/27/18 1339  Vital Signs  Temp 98 F (36.7 C)  Temp Source Oral  Pulse Rate 81  Pulse Rate Source Dinamap  Resp 18  BP 123/90  BP Location Right Arm  BP Method Automatic  Patient Position (if appropriate) Lying  Oxygen Therapy  SpO2 100 %  O2 Device Nasal Cannula  O2 Flow Rate (L/min) 2 L/min  Pain Assessment  Pain Scale 0-10  Pain Score 0  During Hemodialysis Assessment  KECN 60.4 KECN  Dialysis Fluid Bolus Normal Saline  Bolus Amount (mL) 250 mL  Intra-Hemodialysis Comments Tx completed;Tolerated well

## 2018-12-27 NOTE — Progress Notes (Signed)
[facility] [place], [place] 12/27/18  Subjective:   LOS: 9 09/16 0701 - 09/17 0700 In: 505.3 [P.O.:60; I.V.:200; IV Piggyback:245.3] Out: 0    Patient seen during dialysis Tolerating well   HEMODIALYSIS FLOWSHEET:  Blood Flow Rate (mL/min): 300 mL/min Arterial Pressure (mmHg): -160 mmHg Venous Pressure (mmHg): 140 mmHg Transmembrane Pressure (mmHg): 60 mmHg Ultrafiltration Rate (mL/min): 870 mL/min Dialysate Flow Rate (mL/min): 800 ml/min Conductivity: Machine : 14 Conductivity: Machine : 14 Dialysis Fluid Bolus: Normal Saline Bolus Amount (mL): 250 mL     Objective:  Vital signs in last 24 hours:  Temp:  [97.4 F (36.3 C)-98.7 F (37.1 C)] 97.9 F (36.6 C) (09/17 1005) Pulse Rate:  [73-100] 89 (09/17 1415) Resp:  [0-32] 20 (09/17 1415) BP: (96-127)/(67-87) 127/82 (09/17 1419) SpO2:  [88 %-100 %] 100 % (09/17 1415) Weight:  [60.2 kg] 60.2 kg (09/17 1005)  Weight change: 0.368 kg Filed Weights   12/27/18 0500 12/27/18 1000 12/27/18 1005  Weight: 60.2 kg 60.2 kg 60.2 kg    Intake/Output:    Intake/Output Summary (Last 24 hours) at 12/27/2018 1518 Last data filed at 12/27/2018 0514 Gross per 24 hour  Intake 305.31 ml  Output 0 ml  Net 305.31 ml     Physical Exam: General: Anxious appearing  HEENT Anicteric. Moist oral mucus membranes  Neck supple  Pulm/lungs Clear today  CVS/Heart Tachycardic, regular  Abdomen:  Soft, NT  Extremities:  1+ edema on the right leg  Neurologic: Alert. Able to follow commands  Skin: warm  Access: Left fem temp cath, left arm aneurysmal AVF       Basic Metabolic Panel:  Recent Labs  Lab 12/21/18 0616 12/21/18 1455 12/22/18 0358 12/23/18 0446 12/24/18 0817 12/26/18 0346  NA 138  --  133* 137 133* 137  K 4.4  --  5.1 4.1 5.0 4.3  CL 100  --  98 100 96* 98  CO2 27  --  25 27 20* 26  GLUCOSE 141* 117* 140* 98 95 110*  BUN 25*  --  38* 37* 61* 40*  CREATININE 6.86*  --  8.97* 7.52* 10.39*  6.89*  CALCIUM 7.7*  --  6.4* 6.9* 6.8* 7.1*  MG  --   --  1.8  --   --   --   PHOS  --   --  4.9*  --   --   --      CBC: Recent Labs  Lab 12/21/18 0616 12/21/18 1846 12/22/18 0358 12/23/18 0446 12/24/18 0817 12/26/18 0346  WBC 7.1 8.3 6.5 5.8 7.9 7.6  NEUTROABS 5.2  --   --   --   --   --   HGB 8.1* 5.3* 7.3* 6.6* 8.4* 7.9*  HCT 24.7* 16.4* 22.2* 20.5* 26.7* 24.9*  MCV 96.5 97.6 89.2 91.1 95.7 93.6  PLT 69* 83* 64* 83* PLATELET CLUMPS NOTED ON SMEAR, UNABLE TO ESTIMATE 168      Lab Results  Component Value Date   HEPBSAG Negative 12/18/2018      Microbiology:  Recent Results (from the past 240 hour(s))  SARS Coronavirus 2 [facility] order, Performed in [facility] hospital lab) Nasopharyngeal Nasopharyngeal Swab     Status: None   Collection Time: 12/18/18 12:31 AM   Specimen: Nasopharyngeal Swab  Result Value Ref Range Status   SARS Coronavirus 2 NEGATIVE NEGATIVE Final    Comment: (NOTE) If result is NEGATIVE SARS-CoV-2 target nucleic acids are NOT DETECTED. The SARS-CoV-2 RNA is generally detectable  in upper and lower  respiratory specimens during the acute phase of infection. The lowest  concentration of SARS-CoV-2 viral copies this assay can detect is 250  copies / mL. A negative result does not preclude SARS-CoV-2 infection  and should not be used as the sole basis for treatment or other  patient management decisions.  A negative result may occur with  improper specimen collection / handling, submission of specimen other  than nasopharyngeal swab, presence of viral mutation(s) within the  areas targeted by this assay, and inadequate number of viral copies  (<250 copies / mL). A negative result must be combined with clinical  observations, patient history, and epidemiological information. If result is POSITIVE SARS-CoV-2 target nucleic acids are DETECTED. The SARS-CoV-2 RNA is generally detectable in upper and lower  respiratory specimens dur ing the  acute phase of infection.  Positive  results are indicative of active infection with SARS-CoV-2.  Clinical  correlation with patient history and other diagnostic information is  necessary to determine patient infection status.  Positive results do  not rule out bacterial infection or co-infection with other viruses. If result is PRESUMPTIVE POSTIVE SARS-CoV-2 nucleic acids MAY BE PRESENT.   A presumptive positive result was obtained on the submitted specimen  and confirmed on repeat testing.  While 2019 novel coronavirus  (SARS-CoV-2) nucleic acids may be present in the submitted sample  additional confirmatory testing may be necessary for epidemiological  and / or clinical management purposes  to differentiate between  SARS-CoV-2 and other Sarbecovirus currently known to infect humans.  If clinically indicated additional testing with an alternate test  methodology [phone]) is advised. The SARS-CoV-2 RNA is generally  detectable in upper and lower respiratory sp ecimens during the acute  phase of infection. The expected result is Negative. Fact Sheet for Patients:  StrictlyIdeas.no Fact Sheet for Healthcare Providers: BankingDealers.co.za This test is not yet approved or cleared by the [place] FDA and has been authorized for detection and/or diagnosis of SARS-CoV-2 by FDA under an Emergency Use Authorization (EUA).  This EUA will remain in effect (meaning this test can be used) for the duration of the COVID-19 declaration under Section 564(b)(1) of the Act, 21 U.S.C. section 360bbb-3(b)(1), unless the authorization is terminated or revoked sooner. Performed at [facility], [address] [place], [place] [address]   MRSA PCR Screening     Status: None   Collection Time: 12/18/18  2:32 PM   Specimen: Nasal Mucosa; Nasopharyngeal  Result Value Ref Range Status   MRSA by PCR NEGATIVE NEGATIVE Final    Comment:         The GeneXpert MRSA Assay (FDA approved for NASAL specimens only), is one component of a comprehensive MRSA colonization surveillance program. It is not intended to diagnose MRSA infection nor to guide or monitor treatment for MRSA infections. Performed at [facility], [address] [place], [place] [address]   Culture, blood (Routine X 2) w Reflex to ID Panel     Status: None (Preliminary result)   Collection Time: 12/24/18  4:20 PM   Specimen: BLOOD  Result Value Ref Range Status   Specimen Description BLOOD LEFT FEMORAL CATH  Final   Special Requests   Final    BOTTLES DRAWN AEROBIC AND ANAEROBIC Blood Culture results may not be optimal due to an excessive volume of blood received in culture bottles   Culture   Final    NO GROWTH 3 DAYS Performed at [facility],  [place], [place] [address]    Report Status PENDING  Incomplete  Culture, blood (Routine X 2) w Reflex to ID Panel     Status: None (Preliminary result)   Collection Time: 12/24/18  4:25 PM   Specimen: BLOOD  Result Value Ref Range Status   Specimen Description BLOOD LEFT FEMORAL CATH  Final   Special Requests   Final    BOTTLES DRAWN AEROBIC AND ANAEROBIC Blood Culture results may not be optimal due to an excessive volume of blood received in culture bottles   Culture   Final    NO GROWTH 3 DAYS Performed at [facility], [address] [place],  [address]    Report Status PENDING  Incomplete    Coagulation Studies: No results for input(s): LABPROT, INR in the last 72 hours.  Urinalysis: No results for input(s): COLORURINE, LABSPEC, PHURINE, GLUCOSEU, HGBUR, BILIRUBINUR, KETONESUR, PROTEINUR, UROBILINOGEN, NITRITE, LEUKOCYTESUR in the last 72 hours.  Invalid input(s): APPERANCEUR    Imaging: No results found.   Medications:   . piperacillin-tazobactam (ZOSYN)  IV 3.375 g (12/27/18 0517)  . vancomycin Stopped (12/27/18 1513)   . aspirin EC  81  mg Oral Daily  . Chlorhexidine Gluconate Cloth  6 each Topical Q0600  . docusate sodium  100 mg Oral BID  . epoetin (EPOGEN/PROCRIT) injection  10,000 Units Intravenous Q T,Th,Sa-HD  . feeding supplement (NEPRO CARB STEADY)  237 mL Oral BID BM  . influenza vac split quadrivalent PF  0.5 mL Intramuscular Tomorrow-1000  . isosorbide mononitrate  30 mg Oral Daily  . metoprolol tartrate  25 mg Oral BID  . multivitamin  1 tablet Oral QHS  . ticagrelor  90 mg Oral BID   acetaminophen **OR** [DISCONTINUED] acetaminophen, calcium carbonate, heparin, HYDROcodone-acetaminophen, HYDROmorphone (DILAUDID) injection, LORazepam, morphine injection, [DISCONTINUED] ondansetron **OR** ondansetron (ZOFRAN) IV, ondansetron (ZOFRAN) IV  Assessment/ Plan:  43 y.o. male with  end stage renal disease on hemodialysis followed by [facility], hypertension, coronary artery disease, secondary hyperparathyroidism status post parathyroidectomy who presents to [facility] with hyperkalemia and pulmonary edema.   [facility] Nephrology TTS [place] Left AVF  Active Problems:   Acute on chronic respiratory failure with hypoxemia (HCC)   #. ESRD with hyperkalemia and Shortness of breath Recent Labs    12/22/18 0358 12/23/18 0446 12/24/18 0817 12/26/18 0346  CREATININE 8.97* 7.52* 10.39* 6.89*   Underwent Percutaneous transluminal angioplasty of the proximal left forearm cephalic vein with 8 mm diameter drug-coated and 8 mm diameter high-pressure angioplasty balloon Access worked well during HD Remove temp dialysis cath prior to d/c  #. Anemia of CKD  Lab Results  Component Value Date   HGB 7.9 (L) 12/26/2018  post transfusion  #. SHPTH  No results found for: PTH Lab Results  Component Value Date   PHOS 4.9 (H) 12/22/2018  monitor  # Vascular; complication of dialysis access - angiogram competed - remove temp cath     LOS: [place] 9/17/20203:18 [place], [place]

## 2018-12-27 NOTE — Progress Notes (Signed)
Post HD:     12/27/18 1345  Vital Signs  Temp 98 F (36.7 C)  Temp Source Oral  Pulse Rate 87  Pulse Rate Source Dinamap  Resp 20  BP 113/82  BP Location Right Arm  BP Method Automatic  Patient Position (if appropriate) Lying  Oxygen Therapy  SpO2 100 %  O2 Device Nasal Cannula  O2 Flow Rate (L/min) 2 L/min  Pain Assessment  Pain Scale 0-10  Pain Score 0  Dialysis Weight  Weight 56.6 kg  Type of Weight Post-Dialysis  Post-Hemodialysis Assessment  Rinseback Volume (mL) 250 mL  KECN 60.4 V  Dialyzer Clearance Lightly streaked  Duration of HD Treatment -hour(s) 3.5 hour(s)  Hemodialysis Intake (mL) 500 mL  UF Total -Machine (mL) 3002 mL  Net UF (mL) 2502 mL  Tolerated HD Treatment Yes  AVG/AVF Arterial Site Held (minutes)  (10)  AVG/AVF Venous Site Held (minutes)  (10)  Education / Care Plan  Dialysis Education Provided Yes  Documented Education in Care Plan Yes  Outpatient Plan of Care Reviewed and on Chart Yes  Fistula / Graft  Placement Date: 04/27/18    Site Condition No complications  Fistula / Graft Assessment Bruit;Thrill;Present  Status Deaccessed  Drainage Description None

## 2018-12-27 NOTE — Progress Notes (Signed)
Ralph Lucero  A and O x 4 VSS. Pt tolerating diet well. No complaints of pain or nausea. IV removed intact, prescriptions given. Pt voices understanding of discharge instructions with no further questions. Pt discharged via wheelchair with axillary.   Allergies as of 12/27/2018   No Known Allergies     Medication List    STOP taking these medications   clopidogrel 75 MG tablet Commonly known as: PLAVIX     TAKE these medications   aspirin 81 MG chewable tablet Chew 81 mg by mouth daily. Notes to patient: Morning 12/28/18   calcium acetate (Phos Binder) 667 MG/5ML Soln Commonly known as: PHOSLYRA Take 2,668 mg by mouth 3 (three) times daily with meals. Notes to patient: With evening meal 12/27/18   Dialyvite/Zinc Tabs Take 1 tablet by mouth every evening. Notes to patient: Evening 12/27/18   felodipine 10 MG 24 hr tablet Commonly known as: PLENDIL Take 10 mg by mouth daily. Notes to patient: Morning 12/28/18   isosorbide mononitrate 60 MG 24 hr tablet Commonly known as: IMDUR Take 60 mg by mouth daily. Notes to patient: Morning 12/28/18   lisinopril 5 MG tablet Commonly known as: ZESTRIL Take 5 mg by mouth daily. Notes to patient: Morning 12/28/18   metoprolol tartrate 50 MG tablet Commonly known as: LOPRESSOR Take 50 mg by mouth 2 (two) times daily. Notes to patient: Before bed 12/27/18   sodium polystyrene 15 GM/60ML suspension Commonly known as: KAYEXALATE Take 15 g by mouth daily. Notes to patient: Morning 12/28/18   ticagrelor 90 MG Tabs tablet Commonly known as: BRILINTA Take 90 mg by mouth 2 (two) times daily. Notes to patient: Before bed 12/28/18       Vitals:   12/27/18 1415 12/27/18 1419  BP:  127/82  Pulse: 89   Resp: 20   Temp:    SpO2: 100%     Ralph Lucero Ralph Lucero

## 2018-12-27 NOTE — Progress Notes (Signed)
Pre HD Assessment:     12/27/18 1000  Neurological  Level of Consciousness Alert  Orientation Level Oriented X4  Respiratory  Respiratory Pattern Regular  Bilateral Breath Sounds Clear  Cardiac  Pulse Regular  Heart Sounds S1, S2  Jugular Venous Distention (JVD) No  ECG Monitor Yes  Cardiac Rhythm NSR  Vascular  R Radial Pulse +2  L Radial Pulse +2  Musculoskeletal  Musculoskeletal (WDL) X  Generalized Weakness Yes  Psychosocial  Psychosocial (WDL) X  Patient Behaviors Anxious (d/t pt fear of needles)

## 2018-12-27 NOTE — Progress Notes (Signed)
HD Tx Initiated:     12/27/18 1005  Vital Signs  Temp 97.9 F (36.6 C)  Temp Source Oral  Pulse Rate 97  Pulse Rate Source Dinamap  Resp 20  BP 107/79  BP Location Right Arm  BP Method Automatic  Patient Position (if appropriate) Lying  Oxygen Therapy  SpO2 100 %  O2 Device Nasal Cannula  O2 Flow Rate (L/min) 2 L/min  Pain Assessment  Pain Scale 0-10  Pain Score 0  During Hemodialysis Assessment  Blood Flow Rate (mL/min) 300 mL/min (per pt request)  Arterial Pressure (mmHg) -120 mmHg  Venous Pressure (mmHg) 120 mmHg  Transmembrane Pressure (mmHg) 70 mmHg  Ultrafiltration Rate (mL/min) 870 mL/min  Dialysate Flow Rate (mL/min) 800 ml/min  Conductivity: Machine  14.1  HD Safety Checks Performed Yes  Intra-Hemodialysis Comments Tx initiated

## 2018-12-27 NOTE — Progress Notes (Signed)
Post HD Assessment:    12/27/18 1345  Neurological  Level of Consciousness Alert  Orientation Level Oriented X4  Respiratory  Respiratory Pattern Regular  Bilateral Breath Sounds Clear  Cardiac  Pulse Regular  Heart Sounds S1, S2  Jugular Venous Distention (JVD) No  ECG Monitor Yes  Cardiac Rhythm NSR  Vascular  R Radial Pulse +2  L Radial Pulse +2  Musculoskeletal  Musculoskeletal (WDL) X  Generalized Weakness Yes  Psychosocial  Psychosocial (WDL) X  Patient Behaviors Anxious (d/t pt fear of needles)

## 2018-12-27 NOTE — Progress Notes (Signed)
Pre HD:    12/27/18 1000  Vital Signs  Temp 97.9 F (36.6 C)  Temp Source Oral  Pulse Rate 97  Pulse Rate Source Dinamap  Resp 20  BP 107/79  BP Location Right Arm  BP Method Automatic  Patient Position (if appropriate) Lying  Oxygen Therapy  SpO2 100 %  O2 Device Nasal Cannula  O2 Flow Rate (L/min) 2 L/min  Pain Assessment  Pain Scale 0-10  Pain Score 0  Dialysis Weight  Weight 60.2 kg  Type of Weight Pre-Dialysis  Time-Out for Hemodialysis  What Procedure? HD   Pt Identifiers(min of two) First/Last Name;MRN/Account#;Pt's DOB(use if MRN/Acct# not available  Correct Site? Yes  Correct Side? Yes  Correct Procedure? Yes  Consents Verified? Yes  Rad Studies Available? N/A  Safety Precautions Reviewed? Yes  Engineer, civil (consulting) Number 3  Station Number 1  UF/Alarm Test Passed  Conductivity: Meter 14.2  Conductivity: Machine  14.1  pH 7  Reverse Osmosis main  Normal Saline Lot Number [id number]  Dialyzer Lot Number [id number]  Disposable Set Lot Number [id number]  Machine Temperature 98.6 F (37 C)  Musician and Audible Yes  Blood Lines Intact and Secured Yes  Pre Treatment Patient Checks  Vascular access used during treatment Fistula  HD catheter dressing before treatment WDL  Patient is receiving dialysis in a chair  (no)  Hepatitis B Surface Antigen Results Negative  Date Hepatitis B Surface Antigen Drawn 12/18/18  Isolation Initiated  (no)  Hepatitis B Surface Antibody 294  Date Hepatitis B Surface Antibody Drawn 12/18/18  Hemodialysis Consent Verified Yes  Hemodialysis Standing Orders Initiated Yes  ECG (Telemetry) Monitor On Yes  Prime Ordered Normal Saline  Length of  DialysisTreatment -hour(s) 3.5 Hour(s)  Dialysis Treatment Comments  (Na 140)  Dialyzer Elisio 17H NR  Dialysate 2K;2.5 Ca  Dialysate Flow Ordered 800  Blood Flow Rate Ordered 300 mL/min  Ultrafiltration Goal 2.5 Liters (changed mid Tx per pt request to increase goal)   Dialysis Blood Pressure Support Ordered Normal Saline  Education / Care Plan  Dialysis Education Provided Yes  Documented Education in Care Plan Yes  Outpatient Plan of Care Reviewed and on Chart Yes  Fistula / Graft  Placement Date: 04/27/18    Site Condition No complications  Fistula / Graft Assessment Bruit;Thrill;Present  Status Accessed  Needle Size 16 (per pt request)  Drainage Description None

## 2018-12-27 NOTE — Progress Notes (Signed)
PT Cancellation Note  Patient Details Name: Ralph Lucero MRN: [id number] DOB: [birth year]   Cancelled Treatment:    Reason Eval/Treat Not Completed: Patient at procedure or test/unavailable(Consult received and chart reviewed.  Patient currently off unit in dialysis.  Will re-attempt at later time/date as medically appropriate and available.)   Xachary Hambly H. Owens Shark, PT, DPT, NCS 12/27/18, 11:51 AM [phone]

## 2018-12-27 NOTE — Clinical Social Work Note (Signed)
Per TOC handoff, patient is undocumented. He only has emergency Medicaid to cover HD. Per dialysis coordinator, patient goes to the charitable clinic at [facility] where he can receive whatever care he needs. She stated that the [facility] undocumented residents go there for care. Patient is not discharging on any new medications so will not need to send anything to medication management. No further concerns. CSW signing off.  Dayton Scrape, [place]

## 2018-12-28 ENCOUNTER — Encounter: Payer: Self-pay | Admitting: Internal Medicine

## 2018-12-28 NOTE — Discharge Summary (Signed)
[facility] [place] NAME: Ralph Lucero    MR#:  [id number]  DATE OF BIRTH:  06/30/1975  DATE OF ADMISSION:  12/17/2018 ADMITTING PHYSICIAN: Harrie Foreman, MD  DATE OF DISCHARGE: 12/27/2018  5:54 PM  PRIMARY CARE PHYSICIAN: Anthonette Legato, MD    ADMISSION DIAGNOSIS:  Acute pulmonary edema ([place]) [J81.0] Acute respiratory failure with hypoxia ([place]) [J96.01]  DISCHARGE DIAGNOSIS:  Active Problems:   Acute on chronic respiratory failure with hypoxemia ([place])   SECONDARY DIAGNOSIS:   Past Medical History:  Diagnosis Date  . ESRD (end stage renal disease) on dialysis ([place])   . Hypertension   . Renal disorder     HOSPITAL COURSE:   43 year old Hispanic male with past medical history of end-stage renal disease on hemodialysis, essential hypertension history of coronary artery disease who presented to the hospital due to shortness of breath and acute respiratory failure with hypoxia.  1.  Acute respiratory failure with hypoxia-secondary to fluid overload/pulmonary edema. -Patient received hemodialysis for excess fluid removal and has improved.  He is no longer hypoxic and clinically stable presently.  2.  Right thigh hematoma- patient developed a right thigh hematoma with a right superficial femoral artery pseudoaneurysm.  Patient was seen by vascular surgery underwent repair of the pseudoaneurysm.  Postoperatively patient had some anemia for which she was transfused. - Swelling in the right thigh has improved. Hg stable at discharge.   3.  End-stage renal disease on hemodialysis-patient had poor dialysis access given his right thigh hematoma and now a poor flow through the left upper extremity AV fistula.  Patient underwent a fistulogram which improved his flow of his fistula. -Post intervention to been able to use the left upper extremity AV fistula for dialysis with good flow.  He is therefore being discharged and will continue  his dialysis on a Tuesday Thursday Saturday schedule.  4.  History of coronary artery disease with previous MI in May 2020-acutely patient has no chest pain and is presently stable. -She will continue his aspirin, Brilinta, Imdur, metoprolol.  5.  Acute blood loss anemia-secondary to the right thigh hematoma as mentioned above.  Patient was transfused 2 units of packed red blood cells while in the hospital.  Hemoglobin remained stable at 7.9 upon discharge.  6.  Secondary hyperparathyroidism-patient will continue his calcium acetate.  DISCHARGE CONDITIONS:   Stable  CONSULTS OBTAINED:  Treatment Team:  Delana Meyer Dolores Lory, MD Corey Skains, MD  DRUG ALLERGIES:  No Known Allergies  DISCHARGE MEDICATIONS:   Allergies as of 12/27/2018   No Known Allergies     Medication List    STOP taking these medications   clopidogrel 75 MG tablet Commonly known as: PLAVIX     TAKE these medications   aspirin 81 MG chewable tablet Chew 81 mg by mouth daily. Notes to patient: Morning 12/28/18   calcium acetate (Phos Binder) 667 MG/5ML Soln Commonly known as: PHOSLYRA Take 2,668 mg by mouth 3 (three) times daily with meals. Notes to patient: With evening meal 12/27/18   Dialyvite/Zinc Tabs Take 1 tablet by mouth every evening. Notes to patient: Evening 12/27/18   felodipine 10 MG 24 hr tablet Commonly known as: PLENDIL Take 10 mg by mouth daily. Notes to patient: Morning 12/28/18   isosorbide mononitrate 60 MG 24 hr tablet Commonly known as: IMDUR Take 60 mg by mouth daily. Notes to patient: Morning 12/28/18   lisinopril 5 MG tablet Commonly known as: ZESTRIL Take  5 mg by mouth daily. Notes to patient: Morning 12/28/18   metoprolol tartrate 50 MG tablet Commonly known as: LOPRESSOR Take 50 mg by mouth 2 (two) times daily. Notes to patient: Before bed 12/27/18   sodium polystyrene 15 GM/60ML suspension Commonly known as: KAYEXALATE Take 15 g by mouth daily. Notes to  patient: Morning 12/28/18   ticagrelor 90 MG Tabs tablet Commonly known as: BRILINTA Take 90 mg by mouth 2 (two) times daily. Notes to patient: Before bed 12/28/18         DISCHARGE INSTRUCTIONS:   DIET:  Cardiac diet and Renal diet  DISCHARGE CONDITION:  Stable  ACTIVITY:  Activity as tolerated  OXYGEN:  Home Oxygen: No.   Oxygen Delivery: room air  DISCHARGE LOCATION:  home   If you experience worsening of your admission symptoms, develop shortness of breath, life threatening emergency, suicidal or homicidal thoughts you must seek medical attention immediately by calling 911 or calling your MD immediately  if symptoms less severe.  You Must read complete instructions/literature along with all the possible adverse reactions/side effects for all the Medicines you take and that have been prescribed to you. Take any new Medicines after you have completely understood and accpet all the possible adverse reactions/side effects.   Please note  You were cared for by a hospitalist during your hospital stay. If you have any questions about your discharge medications or the care you received while you were in the hospital after you are discharged, you can call the unit and asked to speak with the hospitalist on call if the hospitalist that took care of you is not available. Once you are discharged, your primary care physician will handle any further medical issues. Please note that NO REFILLS for any discharge medications will be authorized once you are discharged, as it is imperative that you return to your primary care physician (or establish a relationship with a primary care physician if you do not have one) for your aftercare needs so that they can reassess your need for medications and monitor your lab values.     Today   Patient seen at hemodialysis and his fistula is functioning well.  No chest pains, shortness of breath nausea or vomiting.  No other complaints or events  overnight.  Will discharge home after dialysis today.  VITAL SIGNS:  Blood pressure 127/82, pulse 89, temperature 98 F (36.7 C), temperature source Oral, resp. rate 20, height 5\' 8"  (1.727 m), weight 56.6 kg, SpO2 100 %.  I/O:    Intake/Output Summary (Last 24 hours) at 12/28/2018 1535 Last data filed at 12/27/2018 1700 Gross per 24 hour  Intake 43.2 ml  Output -  Net 43.2 ml    PHYSICAL EXAMINATION:  GENERAL:  43 y.o.-year-old patient lying in the bed in NAD.  EYES: Pupils equal, round, reactive to light and accommodation. No scleral icterus. Extraocular muscles intact.  HEENT: Head atraumatic, normocephalic. Oropharynx and nasopharynx clear.  NECK:  Supple, no jugular venous distention. No thyroid enlargement, no tenderness.  LUNGS: Normal breath sounds bilaterally, no wheezing, rales,rhonchi. No use of accessory muscles of respiration.  CARDIOVASCULAR: S1, S2 normal. No murmurs, rubs, or gallops.  ABDOMEN: Soft, non-tender, non-distended. Bowel sounds present. No organomegaly or mass.  EXTREMITIES: No pedal edema, cyanosis, or clubbing.  NEUROLOGIC: Cranial nerves II through XII are intact. No focal motor or sensory defecits b/l.  PSYCHIATRIC: The patient is alert and oriented x 3.  SKIN: No obvious rash, lesion, or ulcer.  Left upper ext. AV fistula with good bruit/thrill.   DATA REVIEW:   CBC Recent Labs  Lab 12/26/18 0346  WBC 7.6  HGB 7.9*  HCT 24.9*  PLT 168    Chemistries  Recent Labs  Lab 12/22/18 0358  12/26/18 0346  NA 133*   < > 137  K 5.1   < > 4.3  CL 98   < > 98  CO2 25   < > 26  GLUCOSE 140*   < > 110*  BUN 38*   < > 40*  CREATININE 8.97*   < > 6.89*  CALCIUM 6.4*   < > 7.1*  MG 1.8  --   --   AST 34  --   --   ALT 16  --   --   ALKPHOS 59  --   --   BILITOT 0.4  --   --    < > = values in this interval not displayed.    Cardiac Enzymes No results for input(s): TROPONINI in the last 168 hours.  Microbiology Results  Results for  orders placed or performed during the hospital encounter of 12/17/18  SARS Coronavirus 2 [facility] order, Performed in [facility] hospital lab) Nasopharyngeal Nasopharyngeal Swab     Status: None   Collection Time: 12/18/18 12:31 AM   Specimen: Nasopharyngeal Swab  Result Value Ref Range Status   SARS Coronavirus 2 NEGATIVE NEGATIVE Final    Comment: (NOTE) If result is NEGATIVE SARS-CoV-2 target nucleic acids are NOT DETECTED. The SARS-CoV-2 RNA is generally detectable in upper and lower  respiratory specimens during the acute phase of infection. The lowest  concentration of SARS-CoV-2 viral copies this assay can detect is 250  copies / mL. A negative result does not preclude SARS-CoV-2 infection  and should not be used as the sole basis for treatment or other  patient management decisions.  A negative result may occur with  improper specimen collection / handling, submission of specimen other  than nasopharyngeal swab, presence of viral mutation(s) within the  areas targeted by this assay, and inadequate number of viral copies  (<250 copies / mL). A negative result must be combined with clinical  observations, patient history, and epidemiological information. If result is POSITIVE SARS-CoV-2 target nucleic acids are DETECTED. The SARS-CoV-2 RNA is generally detectable in upper and lower  respiratory specimens dur ing the acute phase of infection.  Positive  results are indicative of active infection with SARS-CoV-2.  Clinical  correlation with patient history and other diagnostic information is  necessary to determine patient infection status.  Positive results do  not rule out bacterial infection or co-infection with other viruses. If result is PRESUMPTIVE POSTIVE SARS-CoV-2 nucleic acids MAY BE PRESENT.   A presumptive positive result was obtained on the submitted specimen  and confirmed on repeat testing.  While 2019 novel coronavirus  (SARS-CoV-2) nucleic acids may be present  in the submitted sample  additional confirmatory testing may be necessary for epidemiological  and / or clinical management purposes  to differentiate between  SARS-CoV-2 and other Sarbecovirus currently known to infect humans.  If clinically indicated additional testing with an alternate test  methodology [phone]) is advised. The SARS-CoV-2 RNA is generally  detectable in upper and lower respiratory sp ecimens during the acute  phase of infection. The expected result is Negative. Fact Sheet for Patients:  StrictlyIdeas.no Fact Sheet for Healthcare Providers: BankingDealers.co.za This test is not yet approved or cleared by the [place]  FDA and has been authorized for detection and/or diagnosis of SARS-CoV-2 by FDA under an Emergency Use Authorization (EUA).  This EUA will remain in effect (meaning this test can be used) for the duration of the COVID-19 declaration under Section 564(b)(1) of the Act, 21 U.S.C. section 360bbb-3(b)(1), unless the authorization is terminated or revoked sooner. Performed at [facility], [address] [place], [place] [address]   MRSA PCR Screening     Status: None   Collection Time: 12/18/18  2:32 PM   Specimen: Nasal Mucosa; Nasopharyngeal  Result Value Ref Range Status   MRSA by PCR NEGATIVE NEGATIVE Final    Comment:        The GeneXpert MRSA Assay (FDA approved for NASAL specimens only), is one component of a comprehensive MRSA colonization surveillance program. It is not intended to diagnose MRSA infection nor to guide or monitor treatment for MRSA infections. Performed at [facility], [address] [place], [place] [address]   Culture, blood (Routine X 2) w Reflex to ID Panel     Status: None (Preliminary result)   Collection Time: 12/24/18  4:20 PM   Specimen: BLOOD  Result Value Ref Range Status   Specimen Description BLOOD LEFT FEMORAL CATH  Final   Special  Requests   Final    BOTTLES DRAWN AEROBIC AND ANAEROBIC Blood Culture results may not be optimal due to an excessive volume of blood received in culture bottles   Culture   Final    NO GROWTH 4 DAYS Performed at [facility], [address], [place], [place] [address]    Report Status PENDING  Incomplete  Culture, blood (Routine X 2) w Reflex to ID Panel     Status: None (Preliminary result)   Collection Time: 12/24/18  4:25 PM   Specimen: BLOOD  Result Value Ref Range Status   Specimen Description BLOOD LEFT FEMORAL CATH  Final   Special Requests   Final    BOTTLES DRAWN AEROBIC AND ANAEROBIC Blood Culture results may not be optimal due to an excessive volume of blood received in culture bottles   Culture   Final    NO GROWTH 4 DAYS Performed at [facility], [address] [place], [place] [address]    Report Status PENDING  Incomplete    RADIOLOGY:  No results found.    Management plans discussed with the patient, family and they are in agreement.  CODE STATUS:  Code Status History    Date Active Date Inactive Code Status Order ID Comments User Context   12/18/2018 0440 12/27/2018 2100 Full Code [id number]  Harrie Foreman, MD ED   TOTAL TIME TAKING CARE OF THIS PATIENT: 40 minutes.    Henreitta Leber M.D on 12/28/2018 at 3:35 PM  Between 7am to 6pm - Pager - [phone]  After 6pm go to [email or web address] - Proofreader  [facility]  Office  [phone]  CC: Primary care physician; Anthonette Legato, MD

## 2018-12-28 NOTE — Anesthesia Postprocedure Evaluation (Signed)
Anesthesia Post Note  Patient: Ralph Lucero  Procedure(s) Performed: A/V SHUNT INTERVENTION WITH PERMCATH INSERTION (Left )  Patient location during evaluation: PACU Anesthesia Type: General Level of consciousness: awake and alert and oriented Pain management: pain level controlled Vital Signs Assessment: post-procedure vital signs reviewed and stable Respiratory status: spontaneous breathing, nonlabored ventilation and respiratory function stable Cardiovascular status: blood pressure returned to baseline and stable Postop Assessment: no signs of nausea or vomiting Anesthetic complications: no     Last Vitals:  Vitals:   12/27/18 1415 12/27/18 1419  BP:  127/82  Pulse: 89   Resp: 20   Temp:    SpO2: 100%     Last Pain:  Vitals:   12/27/18 1345  TempSrc: Oral  PainSc: 0-No pain                 Riggins Cisek

## 2018-12-29 LAB — CULTURE, BLOOD (ROUTINE X 2)
Culture: NO GROWTH
Culture: NO GROWTH

## 2019-01-07 ENCOUNTER — Other Ambulatory Visit (INDEPENDENT_AMBULATORY_CARE_PROVIDER_SITE_OTHER): Payer: Self-pay | Admitting: Nurse Practitioner

## 2019-01-07 ENCOUNTER — Ambulatory Visit (INDEPENDENT_AMBULATORY_CARE_PROVIDER_SITE_OTHER): Payer: Self-pay

## 2019-01-07 ENCOUNTER — Encounter (INDEPENDENT_AMBULATORY_CARE_PROVIDER_SITE_OTHER): Payer: Self-pay | Admitting: Nurse Practitioner

## 2019-01-07 ENCOUNTER — Other Ambulatory Visit: Payer: Self-pay

## 2019-01-07 ENCOUNTER — Ambulatory Visit (INDEPENDENT_AMBULATORY_CARE_PROVIDER_SITE_OTHER): Payer: Self-pay | Admitting: Nurse Practitioner

## 2019-01-07 VITALS — BP 109/73 | HR 88 | Resp 10 | Ht 69.0 in | Wt 129.0 lb

## 2019-01-07 DIAGNOSIS — I770 Arteriovenous fistula, acquired: Secondary | ICD-10-CM

## 2019-01-07 DIAGNOSIS — I729 Aneurysm of unspecified site: Secondary | ICD-10-CM

## 2019-01-07 DIAGNOSIS — I724 Aneurysm of artery of lower extremity: Secondary | ICD-10-CM

## 2019-01-07 DIAGNOSIS — Z8679 Personal history of other diseases of the circulatory system: Secondary | ICD-10-CM

## 2019-01-07 DIAGNOSIS — Z9889 Other specified postprocedural states: Secondary | ICD-10-CM

## 2019-01-07 DIAGNOSIS — I10 Essential (primary) hypertension: Secondary | ICD-10-CM

## 2019-01-08 ENCOUNTER — Encounter (INDEPENDENT_AMBULATORY_CARE_PROVIDER_SITE_OTHER): Payer: Self-pay | Admitting: Nurse Practitioner

## 2019-01-08 DIAGNOSIS — I729 Aneurysm of unspecified site: Secondary | ICD-10-CM | POA: Insufficient documentation

## 2019-01-08 NOTE — Progress Notes (Signed)
SUBJECTIVE:  Patient ID: Ralph Lucero, male    DOB: 1975-09-09, 43 y.o.   MRN: [id number] Chief Complaint  Patient presents with  . Follow-up    HPI  Ralph Lucero is a 43 y.o. male presents today after right groin hematoma evacuation on 12/21/2018.  The patient had a pseudoaneurysm with massive hematoma and ischemic changes associated with a traumatic femorofemoral AV fistula.  The patient subsequently had a JP drain placed.  Today the patient is still somewhat sore and tender in his right groin area.  The thigh is somewhat swollen and hard.  There are no ischemic changes present.  The incision site looks good with no area of dehiscence or eschar.  The patient currently denies any fever, chills, nausea, vomiting or diarrhea.  He denies any chest pain or shortness of breath.  Noninvasive studies were done and the patient currently has triphasic waveforms within the common femoral artery proximal SFA and the external iliac.  No evidence of pseudoaneurysm seen in the right groin area.  There does appear to be a hematoma within the proximal thigh area as well as the anterior mid and laterally but with no active flow present.  There appears to be no evidence of DVT within the right thigh.  Past Medical History:  Diagnosis Date  . ESRD (end stage renal disease) on dialysis ([place])   . Hypertension   . Renal disorder     Past Surgical History:  Procedure Laterality Date  . A/V SHUNT INTERVENTION Left 12/26/2018   Procedure: A/V SHUNT INTERVENTION WITH PERMCATH INSERTION;  Surgeon: Algernon Huxley, MD;  Location: [place] [facility];  Service: Cardiovascular;  Laterality: Left;  . CORONARY/GRAFT ACUTE MI REVASCULARIZATION N/A 08/30/2018   Procedure: Coronary/Graft Acute MI Revascularization;  Surgeon: Isaias Cowman, MD;  Location: [place] [facility];  Service: Cardiovascular;  Laterality: N/A;  . HEMATOMA EVACUATION Right 12/21/2018   Procedure: EVACUATION RIGHT THIGH  HEMATOMA WITH REPAIR OF RIGHT SFA PSEUDOANERYSM;  Surgeon: Katha Cabal, MD;  Location: [facility];  Service: Vascular;  Laterality: Right;  . LEFT HEART CATH AND CORONARY ANGIOGRAPHY N/A 08/30/2018   Procedure: LEFT HEART CATH AND CORONARY ANGIOGRAPHY;  Surgeon: Isaias Cowman, MD;  Location: [place] [facility];  Service: Cardiovascular;  Laterality: N/A;  . TEMPORARY DIALYSIS CATHETER Left 12/18/2018   Procedure: TEMPORARY DIALYSIS CATHETER;  Surgeon: Katha Cabal, MD;  Location: [place] [facility];  Service: Cardiovascular;  Laterality: Left;    Social History   Socioeconomic History  . Marital status: Single    Spouse name: Not on file  . Number of children: Not on file  . Years of education: Not on file  . Highest education level: Not on file  Occupational History  . Not on file  Social Needs  . Financial resource strain: Not on file  . Food insecurity    Worry: Not on file    Inability: Not on file  . Transportation needs    Medical: Not on file    Non-medical: Not on file  Tobacco Use  . Smoking status: Never Smoker  . Smokeless tobacco: Never Used  Substance and Sexual Activity  . Alcohol use: Never    Frequency: Never  . Drug use: Never  . Sexual activity: Not on file  Lifestyle  . Physical activity    Days per week: Not on file    Minutes per session: Not on file  . Stress: Not on file  Relationships  . Social  connections    Talks on phone: Not on file    Gets together: Not on file    Attends religious service: Not on file    Active member of club or organization: Not on file    Attends meetings of clubs or organizations: Not on file    Relationship status: Not on file  . Intimate partner violence    Fear of current or ex partner: Not on file    Emotionally abused: Not on file    Physically abused: Not on file    Forced sexual activity: Not on file  Other Topics Concern  . Not on file  Social History Narrative   ** Merged History  Encounter **        History reviewed. No pertinent family history.  Allergies  Allergen Reactions  . Vancomycin      Review of Systems   Review of Systems: Negative Unless Checked Constitutional: [] Weight loss  [] Fever  [] Chills Cardiac: [] Chest pain   []  Atrial Fibrillation  [] Palpitations   [] Shortness of breath when laying flat   [] Shortness of breath with exertion. [] Shortness of breath at rest Vascular:  [] Pain in legs with walking   [] Pain in legs with standing [] Pain in legs when laying flat   [] Claudication    [] Pain in feet when laying flat    [] History of DVT   [] Phlebitis   [x] Swelling in legs   [] Varicose veins   [] Non-healing ulcers Pulmonary:   [] Uses home oxygen   [] Productive cough   [] Hemoptysis   [] Wheeze  [] COPD   [] Asthma Neurologic:  [] Dizziness   [] Seizures  [] Blackouts [] History of stroke   [] History of TIA  [] Aphasia   [] Temporary Blindness   [] Weakness or numbness in arm   [x] Weakness or numbness in leg Musculoskeletal:   [] Joint swelling   [] Joint pain   [] Low back pain  []  History of Knee Replacement [] Arthritis [] back Surgeries  []  Spinal Stenosis    Hematologic:  [] Easy bruising  [] Easy bleeding   [] Hypercoagulable state   [x] Anemic Gastrointestinal:  [] Diarrhea   [] Vomiting  [] Gastroesophageal reflux/heartburn   [] Difficulty swallowing. [] Abdominal pain Genitourinary:  [x] Chronic kidney disease   [] Difficult urination  [] Anuric   [] Blood in urine [] Frequent urination  [] Burning with urination   [] Hematuria Skin:  [] Rashes   [] Ulcers [] Wounds Psychological:  [] History of anxiety   []  History of major depression  []  Memory Difficulties      OBJECTIVE:   Physical Exam  BP 109/73 (BP Location: Right Arm, Patient Position: Sitting, Cuff Size: Normal)   Pulse 88   Resp 10   Ht 5\' 9"  (1.753 m)   Wt 129 lb (58.5 kg)   BMI 19.05 kg/m   Gen: WD/WN, NAD Head: [place]/AT, No temporalis wasting.  Ear/Nose/Throat: Hearing grossly intact, nares w/o erythema or  drainage Eyes: PER, EOMI, sclera nonicteric.  Neck: Supple, no masses.  No JVD.  Pulmonary:  Good air movement, no use of accessory muscles.  Cardiac: RRR Vascular:  Clean, dry, and intact incision.  Staples still in place.  Area is still hard and swollen consistent with hematoma. Vessel Right Left  Radial Palpable Palpable   Gastrointestinal: soft, non-distended. No guarding/no peritoneal signs.  Musculoskeletal: M/S 5/5 throughout.  No deformity or atrophy.  Neurologic: Pain and light touch intact in extremities.  Symmetrical.  Speech is fluent. Motor exam as listed above. Psychiatric: Judgment intact, Mood & affect appropriate for pt's clinical situation. Dermatologic: No Venous rashes. No Ulcers Noted.  No  changes consistent with cellulitis. Lymph : No Cervical lymphadenopathy, no lichenification or skin changes of chronic lymphedema.       ASSESSMENT AND PLAN:  1. Essential hypertension Continue antihypertensive medications as already ordered, these medications have been reviewed and there are no changes at this time.   2. Pseudoaneurysm (HCC) Pseudoaneurysm has been evaluated and he currently does not have any evidence left 1.  Hematoma is likely due to the fact that during the postoperative course the JP drain had very little drainage.  This hematoma may be more so closely related to postoperative changes as there is not a great amount as well as there is no flow evident.  Patient has been advised to use cool or warm compresses to the area to help with pain as well as to help relieve the hematoma.  Patient should also continue with prescribed pain medicine.  Patient will follow-up in 1 to 2 weeks for staple removal.   Current Outpatient Medications on File Prior to Visit  Medication Sig Dispense Refill  . aspirin 81 MG chewable tablet Chew 81 mg by mouth daily.    Marland Kitchen atorvastatin (LIPITOR) 80 MG tablet Take 1 tablet (80 mg total) by mouth daily at 6 PM. 30 tablet 0  . isosorbide  mononitrate (IMDUR) 60 MG 24 hr tablet Take 1 tablet (60 mg total) by mouth daily. 30 tablet 0  . lisinopril (ZESTRIL) 5 MG tablet Take 1 tablet (5 mg total) by mouth daily. 30 tablet 0  . metoprolol tartrate (LOPRESSOR) 50 MG tablet Take 1 tablet (50 mg total) by mouth 2 (two) times daily. 60 tablet 0  . ticagrelor (BRILINTA) 90 MG TABS tablet Take 90 mg by mouth 2 (two) times daily.    . isosorbide mononitrate (IMDUR) 60 MG 24 hr tablet Take 60 mg by mouth daily.     No current facility-administered medications on file prior to visit.     There are no Patient Instructions on file for this visit. No follow-ups on file.   Kris Hartmann, NP  This note was completed with Sales executive.  Any errors are purely unintentional.

## 2019-01-21 ENCOUNTER — Other Ambulatory Visit: Payer: Self-pay

## 2019-01-21 ENCOUNTER — Encounter (INDEPENDENT_AMBULATORY_CARE_PROVIDER_SITE_OTHER): Payer: Self-pay | Admitting: Nurse Practitioner

## 2019-01-21 ENCOUNTER — Ambulatory Visit (INDEPENDENT_AMBULATORY_CARE_PROVIDER_SITE_OTHER): Payer: Self-pay | Admitting: Nurse Practitioner

## 2019-01-21 VITALS — BP 132/77 | HR 86 | Resp 17 | Ht 65.5 in | Wt 128.0 lb

## 2019-01-21 DIAGNOSIS — I10 Essential (primary) hypertension: Secondary | ICD-10-CM

## 2019-01-21 DIAGNOSIS — I729 Aneurysm of unspecified site: Secondary | ICD-10-CM

## 2019-01-27 ENCOUNTER — Encounter (INDEPENDENT_AMBULATORY_CARE_PROVIDER_SITE_OTHER): Payer: Self-pay | Admitting: Nurse Practitioner

## 2019-01-27 NOTE — Progress Notes (Signed)
SUBJECTIVE:  Patient ID: Ralph Lucero, male    DOB: [birth year], 43 y.o.   MRN: [id number] Chief Complaint  Patient presents with  . Follow-up    HPI  Ralph Lucero is a 43 y.o. male that presents today to have sutures removed.  The patient had a right thigh hematoma evacuated as well as repair of traumatic pseudoaneurysm.  The patient states that he feels that the swelling has greatly decreased and that he is ambulating better. It is still somewhat sore to the touch.  He denies any fever, chills, nausea or vomiting.    Past Medical History:  Diagnosis Date  . ESRD (end stage renal disease) on dialysis ([place])   . Hypertension   . Renal disorder     Past Surgical History:  Procedure Laterality Date  . A/V SHUNT INTERVENTION Left 12/26/2018   Procedure: A/V SHUNT INTERVENTION WITH PERMCATH INSERTION;  Surgeon: Algernon Huxley, MD;  Location: [place] [facility];  Service: Cardiovascular;  Laterality: Left;  . CORONARY/GRAFT ACUTE MI REVASCULARIZATION N/A 08/30/2018   Procedure: Coronary/Graft Acute MI Revascularization;  Surgeon: Isaias Cowman, MD;  Location: [place] [facility];  Service: Cardiovascular;  Laterality: N/A;  . HEMATOMA EVACUATION Right 12/21/2018   Procedure: EVACUATION RIGHT THIGH HEMATOMA WITH REPAIR OF RIGHT SFA PSEUDOANERYSM;  Surgeon: Katha Cabal, MD;  Location: [facility];  Service: Vascular;  Laterality: Right;  . LEFT HEART CATH AND CORONARY ANGIOGRAPHY N/A 08/30/2018   Procedure: LEFT HEART CATH AND CORONARY ANGIOGRAPHY;  Surgeon: Isaias Cowman, MD;  Location: [place] [facility];  Service: Cardiovascular;  Laterality: N/A;  . TEMPORARY DIALYSIS CATHETER Left 12/18/2018   Procedure: TEMPORARY DIALYSIS CATHETER;  Surgeon: Katha Cabal, MD;  Location: [place] [facility];  Service: Cardiovascular;  Laterality: Left;    Social History   Socioeconomic History  . Marital status: Single    Spouse name: Not on file  . Number  of children: Not on file  . Years of education: Not on file  . Highest education level: Not on file  Occupational History  . Not on file  Social Needs  . Financial resource strain: Not on file  . Food insecurity    Worry: Not on file    Inability: Not on file  . Transportation needs    Medical: Not on file    Non-medical: Not on file  Tobacco Use  . Smoking status: Never Smoker  . Smokeless tobacco: Never Used  Substance and Sexual Activity  . Alcohol use: Never    Frequency: Never  . Drug use: Never  . Sexual activity: Not on file  Lifestyle  . Physical activity    Days per week: Not on file    Minutes per session: Not on file  . Stress: Not on file  Relationships  . Social Herbalist on phone: Not on file    Gets together: Not on file    Attends religious service: Not on file    Active member of club or organization: Not on file    Attends meetings of clubs or organizations: Not on file    Relationship status: Not on file  . Intimate partner violence    Fear of current or ex partner: Not on file    Emotionally abused: Not on file    Physically abused: Not on file    Forced sexual activity: Not on file  Other Topics Concern  . Not on file  Social History Narrative   **  Merged History Encounter **        History reviewed. No pertinent family history.  Allergies  Allergen Reactions  . Vancomycin      Review of Systems   Review of Systems: Negative Unless Checked Constitutional: [] Weight loss  [] Fever  [] Chills Cardiac: [] Chest pain   []  Atrial Fibrillation  [] Palpitations   [] Shortness of breath when laying flat   [] Shortness of breath with exertion. [] Shortness of breath at rest Vascular:  [] Pain in legs with walking   [] Pain in legs with standing [] Pain in legs when laying flat   [] Claudication    [] Pain in feet when laying flat    [] History of DVT   [] Phlebitis   [] Swelling in legs   [] Varicose veins   [] Non-healing ulcers Pulmonary:   [] Uses  home oxygen   [] Productive cough   [] Hemoptysis   [] Wheeze  [] COPD   [] Asthma Neurologic:  [] Dizziness   [] Seizures  [] Blackouts [] History of stroke   [] History of TIA  [] Aphasia   [] Temporary Blindness   [] Weakness or numbness in arm   [x] Weakness or numbness in leg Musculoskeletal:   [] Joint swelling   [] Joint pain   [] Low back pain  []  History of Knee Replacement [] Arthritis [] back Surgeries  []  Spinal Stenosis    Hematologic:  [] Easy bruising  [] Easy bleeding   [] Hypercoagulable state   [x] Anemic Gastrointestinal:  [] Diarrhea   [] Vomiting  [] Gastroesophageal reflux/heartburn   [] Difficulty swallowing. [] Abdominal pain Genitourinary:  [x] Chronic kidney disease   [] Difficult urination  [] Anuric   [] Blood in urine [] Frequent urination  [] Burning with urination   [] Hematuria Skin:  [] Rashes   [] Ulcers [] Wounds Psychological:  [] History of anxiety   []  History of major depression  []  Memory Difficulties      OBJECTIVE:   Physical Exam  BP 132/77 (BP Location: Right Arm)   Pulse 86   Resp 17   Ht 5' 5.5" (1.664 m)   Wt 128 lb (58.1 kg)   BMI 20.98 kg/m   Gen: WD/WN, NAD Head: [place]/AT, No temporalis wasting.  Ear/Nose/Throat: Hearing grossly intact, nares w/o erythema or drainage Eyes: PER, EOMI, sclera nonicteric.  Neck: Supple, no masses.  No JVD.  Pulmonary:  Good air movement, no use of accessory muscles.  Cardiac: RRR Vascular: incision appears clean, dry, and intact. Swelling greatly reduced Vessel Right Left  Radial Palpable Palpable   Gastrointestinal: soft, non-distended. No guarding/no peritoneal signs.  Musculoskeletal: M/S 5/5 throughout.  No deformity or atrophy.  Neurologic: Pain and light touch intact in extremities.  Symmetrical.  Speech is fluent. Motor exam as listed above. Psychiatric: Judgment intact, Mood & affect appropriate for pt's clinical situation. Dermatologic: No Venous rashes. No Ulcers Noted.  No changes consistent with cellulitis. Lymph : No Cervical  lymphadenopathy, no lichenification or skin changes of chronic lymphedema.       ASSESSMENT AND PLAN:  1. Pseudoaneurysm ([place]) Removed half of staples today and placed steri strips. Patient handled well.  Will return in 1-2 weeks for further staple removal. Will continue exercise and warm compresses.   2. Essential hypertension Continue antihypertensive medications as already ordered, these medications have been reviewed and there are no changes at this time.    Current Outpatient Medications on File Prior to Visit  Medication Sig Dispense Refill  . aspirin 81 MG chewable tablet Chew 81 mg by mouth daily.    Marland Kitchen atorvastatin (LIPITOR) 80 MG tablet Take 1 tablet (80 mg total) by mouth daily at 6 PM. 30 tablet 0  .  isosorbide mononitrate (IMDUR) 60 MG 24 hr tablet Take 1 tablet (60 mg total) by mouth daily. 30 tablet 0  . isosorbide mononitrate (IMDUR) 60 MG 24 hr tablet Take 60 mg by mouth daily.    Marland Kitchen lisinopril (ZESTRIL) 5 MG tablet Take 1 tablet (5 mg total) by mouth daily. 30 tablet 0  . metoprolol tartrate (LOPRESSOR) 50 MG tablet Take 1 tablet (50 mg total) by mouth 2 (two) times daily. 60 tablet 0  . ticagrelor (BRILINTA) 90 MG TABS tablet Take 90 mg by mouth 2 (two) times daily.     No current facility-administered medications on file prior to visit.     There are no Patient Instructions on file for this visit. No follow-ups on file.   Kris Hartmann, NP  This note was completed with Sales executive.  Any errors are purely unintentional.

## 2019-02-04 ENCOUNTER — Ambulatory Visit (INDEPENDENT_AMBULATORY_CARE_PROVIDER_SITE_OTHER): Payer: Self-pay | Admitting: Nurse Practitioner

## 2019-02-04 ENCOUNTER — Encounter (INDEPENDENT_AMBULATORY_CARE_PROVIDER_SITE_OTHER): Payer: Self-pay | Admitting: Nurse Practitioner

## 2019-02-04 ENCOUNTER — Other Ambulatory Visit: Payer: Self-pay

## 2019-02-04 VITALS — BP 119/79 | HR 73 | Resp 17 | Ht 66.0 in | Wt 127.0 lb

## 2019-02-04 DIAGNOSIS — I10 Essential (primary) hypertension: Secondary | ICD-10-CM

## 2019-02-04 DIAGNOSIS — I729 Aneurysm of unspecified site: Secondary | ICD-10-CM

## 2019-02-04 NOTE — Progress Notes (Signed)
SUBJECTIVE:  Patient ID: Ralph Lucero, male    DOB: 04-19-1975, 43 y.o.   MRN: [id number] Chief Complaint  Patient presents with  . Follow-up    1-2 week staple removal      Ralph Lucero is a 43 y.o. male     SUBJECTIVE:  Patient ID: Ralph Lucero, male    DOB: 1975-11-24, 43 y.o.   MRN: [id number] Chief Complaint  Patient presents with  . Follow-up    1-2 week staple removal    HPI  Ralph Lucero is a 43 y.o. male presents today after right groin hematoma evacuation on 12/21/2018.  The patient had a pseudoaneurysm with massive hematoma and ischemic changes associated with a traumatic femorofemoral AV fistula.  Today the patient presents for removal of the rest of his staples.  The patient's incision is very well-healed and very well approximated at this time.  There are only 2 small areas with scabbing.  The hardness and swelling has greatly decreased for the patient and he also reports being able to walk much easier.  He denies any fever, chills, nausea, vomiting or diarrhea.  He denies any copious bleeding.  Past Medical History:  Diagnosis Date  . ESRD (end stage renal disease) on dialysis ([place])   . Hypertension   . Renal disorder     Past Surgical History:  Procedure Laterality Date  . A/V SHUNT INTERVENTION Left 12/26/2018   Procedure: A/V SHUNT INTERVENTION WITH PERMCATH INSERTION;  Surgeon: Algernon Huxley, MD;  Location: [place] [facility];  Service: Cardiovascular;  Laterality: Left;  . CORONARY/GRAFT ACUTE MI REVASCULARIZATION N/A 08/30/2018   Procedure: Coronary/Graft Acute MI Revascularization;  Surgeon: Isaias Cowman, MD;  Location: [place] [facility];  Service: Cardiovascular;  Laterality: N/A;  . HEMATOMA EVACUATION Right 12/21/2018   Procedure: EVACUATION RIGHT THIGH HEMATOMA WITH REPAIR OF RIGHT SFA PSEUDOANERYSM;  Surgeon: Katha Cabal, MD;  Location: [facility];  Service: Vascular;  Laterality: Right;  . LEFT  HEART CATH AND CORONARY ANGIOGRAPHY N/A 08/30/2018   Procedure: LEFT HEART CATH AND CORONARY ANGIOGRAPHY;  Surgeon: Isaias Cowman, MD;  Location: [place] [facility];  Service: Cardiovascular;  Laterality: N/A;  . TEMPORARY DIALYSIS CATHETER Left 12/18/2018   Procedure: TEMPORARY DIALYSIS CATHETER;  Surgeon: Katha Cabal, MD;  Location: [place] [facility];  Service: Cardiovascular;  Laterality: Left;    Social History   Socioeconomic History  . Marital status: Single    Spouse name: Not on file  . Number of children: Not on file  . Years of education: Not on file  . Highest education level: Not on file  Occupational History  . Not on file  Social Needs  . Financial resource strain: Not on file  . Food insecurity    Worry: Not on file    Inability: Not on file  . Transportation needs    Medical: Not on file    Non-medical: Not on file  Tobacco Use  . Smoking status: Never Smoker  . Smokeless tobacco: Never Used  Substance and Sexual Activity  . Alcohol use: Never    Frequency: Never  . Drug use: Never  . Sexual activity: Not on file  Lifestyle  . Physical activity    Days per week: Not on file    Minutes per session: Not on file  . Stress: Not on file  Relationships  . Social Herbalist on phone: Not on file    Gets together: Not on file  Attends religious service: Not on file    Active member of club or organization: Not on file    Attends meetings of clubs or organizations: Not on file    Relationship status: Not on file  . Intimate partner violence    Fear of current or ex partner: Not on file    Emotionally abused: Not on file    Physically abused: Not on file    Forced sexual activity: Not on file  Other Topics Concern  . Not on file  Social History Narrative   ** Merged History Encounter **        History reviewed. No pertinent family history.  Allergies  Allergen Reactions  . Vancomycin      Review of Systems   Review  of Systems: Negative Unless Checked Constitutional: [] Weight loss  [] Fever  [] Chills Cardiac: [] Chest pain   []  Atrial Fibrillation  [] Palpitations   [] Shortness of breath when laying flat   [x] Shortness of breath with exertion. [] Shortness of breath at rest Vascular:  [] Pain in legs with walking   [] Pain in legs with standing [] Pain in legs when laying flat   [] Claudication    [] Pain in feet when laying flat    [] History of DVT   [] Phlebitis   [x] Swelling in legs   [] Varicose veins   [] Non-healing ulcers Pulmonary:   [] Uses home oxygen   [] Productive cough   [] Hemoptysis   [] Wheeze  [] COPD   [] Asthma Neurologic:  [] Dizziness   [] Seizures  [] Blackouts [] History of stroke   [] History of TIA  [] Aphasia   [] Temporary Blindness   [] Weakness or numbness in arm   [x] Weakness or numbness in leg Musculoskeletal:   [] Joint swelling   [] Joint pain   [] Low back pain  []  History of Knee Replacement [] Arthritis [] back Surgeries  []  Spinal Stenosis    Hematologic:  [] Easy bruising  [] Easy bleeding   [] Hypercoagulable state   [x] Anemic Gastrointestinal:  [] Diarrhea   [] Vomiting  [] Gastroesophageal reflux/heartburn   [] Difficulty swallowing. [] Abdominal pain Genitourinary:  [x] Chronic kidney disease   [] Difficult urination  [] Anuric   [] Blood in urine [] Frequent urination  [] Burning with urination   [] Hematuria Skin:  [] Rashes   [] Ulcers [] Wounds Psychological:  [] History of anxiety   []  History of major depression  []  Memory Difficulties      OBJECTIVE:   Physical Exam  BP 119/79 (BP Location: Right Arm)   Pulse 73   Resp 17   Ht 5\' 6"  (1.676 m)   Wt 127 lb (57.6 kg)   BMI 20.50 kg/m   Gen: WD/WN, NAD Head: [place]/AT, No temporalis wasting.  Ear/Nose/Throat: Hearing grossly intact, nares w/o erythema or drainage Eyes: PER, EOMI, sclera nonicteric.  Neck: Supple, no masses.  No JVD.  Pulmonary:  Good air movement, no use of accessory muscles.  Cardiac: RRR Vascular:  Clean, dry, and intact incision.   Staples removed today.  Area still has some hardened areas but greatly reduced and soften from previous visit. Vessel Right Left  Radial Palpable Palpable   Gastrointestinal: soft, non-distended. No guarding/no peritoneal signs.  Musculoskeletal: M/S 5/5 throughout.  No deformity or atrophy.  Neurologic: Pain and light touch intact in extremities.  Symmetrical.  Speech is fluent. Motor exam as listed above. Psychiatric: Judgment intact, Mood & affect appropriate for pt's clinical situation. Dermatologic: No Venous rashes. No Ulcers Noted.  No changes consistent with cellulitis. Lymph : No Cervical lymphadenopathy, no lichenification or skin changes of chronic lymphedema.  ASSESSMENT AND PLAN:  1. Essential hypertension Continue antihypertensive medications as already ordered, these medications have been reviewed and there are no changes at this time.   2. Pseudoaneurysm (HCC) Currently the patient was hematoma has greatly reduced.  He tolerated staple removal well.  Overall the wound looks to be healing well.  Shallow scabbing will be covered with bandage and allowed to resolve.  Patient is advised to follow-up on an as-needed basis if the wound should deteriorate or worsen.   Current Outpatient Medications on File Prior to Visit  Medication Sig Dispense Refill  . aspirin 81 MG chewable tablet Chew 81 mg by mouth daily.    Marland Kitchen atorvastatin (LIPITOR) 80 MG tablet Take 1 tablet (80 mg total) by mouth daily at 6 PM. 30 tablet 0  . isosorbide mononitrate (IMDUR) 60 MG 24 hr tablet Take 1 tablet (60 mg total) by mouth daily. 30 tablet 0  . isosorbide mononitrate (IMDUR) 60 MG 24 hr tablet Take 60 mg by mouth daily.    Marland Kitchen lisinopril (ZESTRIL) 5 MG tablet Take 1 tablet (5 mg total) by mouth daily. 30 tablet 0  . metoprolol tartrate (LOPRESSOR) 50 MG tablet Take 1 tablet (50 mg total) by mouth 2 (two) times daily. 60 tablet 0  . ticagrelor (BRILINTA) 90 MG TABS tablet Take 90 mg by mouth 2  (two) times daily.     No current facility-administered medications on file prior to visit.     There are no Patient Instructions on file for this visit. No follow-ups on file.   Kris Hartmann, NP  This note was completed with Sales executive.  Any errors are purely unintentional.

## 2019-02-10 ENCOUNTER — Encounter (INDEPENDENT_AMBULATORY_CARE_PROVIDER_SITE_OTHER): Payer: Self-pay | Admitting: Nurse Practitioner

## 2019-11-15 ENCOUNTER — Telehealth (INDEPENDENT_AMBULATORY_CARE_PROVIDER_SITE_OTHER): Payer: Self-pay

## 2019-11-15 NOTE — Telephone Encounter (Signed)
A fax was received from [facility] at [place] for the patient to have a fistulagram. Patient is scheduled for 11/20/19 with a 10:00 am arrival time to the [facility]. Covid testing on 11/18/19 between 8-1 pm at the [facility]. Pre-procedure instructions will be faxed back to [facility] at [place].

## 2019-11-18 ENCOUNTER — Other Ambulatory Visit: Admission: RE | Admit: 2019-11-18 | Payer: Self-pay | Source: Ambulatory Visit

## 2019-11-19 ENCOUNTER — Other Ambulatory Visit (INDEPENDENT_AMBULATORY_CARE_PROVIDER_SITE_OTHER): Payer: Self-pay | Admitting: Nurse Practitioner

## 2019-11-19 NOTE — Telephone Encounter (Signed)
[address] admin called to reschedule the patient from 11/20/19 to 11/25/19 with a 7:45 am arrival time to the [facility]. Patient will do covid testing on 11/21/19 at the [facility].

## 2019-11-21 ENCOUNTER — Other Ambulatory Visit: Admission: RE | Admit: 2019-11-21 | Payer: Self-pay | Source: Ambulatory Visit

## 2019-11-22 ENCOUNTER — Other Ambulatory Visit: Payer: Self-pay | Attending: Vascular Surgery

## 2019-11-22 NOTE — Telephone Encounter (Signed)
Received a chat from [place] that the patient didn't do his covid test yesterday. I contacted [address] and let them know to contact the patient and have him go today to have his covid test.

## 2019-11-25 ENCOUNTER — Encounter: Admission: RE | Payer: Self-pay | Source: Home / Self Care

## 2019-11-25 SURGERY — A/V FISTULAGRAM
Anesthesia: Moderate Sedation | Laterality: Left

## 2019-11-25 MED ORDER — CEFAZOLIN SODIUM-DEXTROSE 2-4 GM/100ML-% IV SOLN: 2.0000 g | Freq: Once | INTRAVENOUS | Status: DC

## 2019-11-26 ENCOUNTER — Ambulatory Visit: Admission: RE | Admit: 2019-11-26 | Payer: Self-pay | Source: Home / Self Care | Admitting: Vascular Surgery

## 2019-11-26 NOTE — Telephone Encounter (Signed)
Received a call from [facility] regarding getting the patient rescheduled for his left arm fistulagram. Patient is scheduled with Dr. Lucky Cowboy on 12/02/19 with a 10:30 am arrival time to the [facility]. Covid testing on 11/28/19 between 8-1 pm at the [facility]. Pre-procedure instructions will be faxed to [facility] per request.

## 2019-11-28 ENCOUNTER — Other Ambulatory Visit: Payer: Self-pay | Attending: Vascular Surgery

## 2019-11-29 ENCOUNTER — Other Ambulatory Visit (INDEPENDENT_AMBULATORY_CARE_PROVIDER_SITE_OTHER): Payer: Self-pay | Admitting: Nurse Practitioner

## 2019-11-29 ENCOUNTER — Telehealth (INDEPENDENT_AMBULATORY_CARE_PROVIDER_SITE_OTHER): Payer: Self-pay

## 2019-11-29 ENCOUNTER — Other Ambulatory Visit: Payer: Self-pay

## 2019-11-29 ENCOUNTER — Other Ambulatory Visit
Admission: RE | Admit: 2019-11-29 | Discharge: 2019-11-29 | Disposition: A | Discharge status: Home / Self Care | Payer: HRSA Program | Source: Ambulatory Visit | Attending: Vascular Surgery | Admitting: Vascular Surgery

## 2019-11-29 DIAGNOSIS — Z01812 Encounter for preprocedural laboratory examination: Secondary | ICD-10-CM | POA: Insufficient documentation

## 2019-11-29 DIAGNOSIS — Z20822 Contact with and (suspected) exposure to covid-19: Secondary | ICD-10-CM | POA: Insufficient documentation

## 2019-11-29 NOTE — Telephone Encounter (Signed)
I attempted to contact the patient and a message was left for a return call to get the patient rescheduled for his permcath left arm fistulagram. This was using interpreter services [employer] - [phone].

## 2019-11-30 LAB — SARS CORONAVIRUS 2 (TAT 6-24 HRS): SARS Coronavirus 2: NEGATIVE

## 2019-12-02 ENCOUNTER — Encounter: Payer: Self-pay | Admitting: Vascular Surgery

## 2019-12-02 ENCOUNTER — Encounter
Admission: RE | Disposition: A | Discharge status: Home / Self Care | Payer: Self-pay | Source: Ambulatory Visit | Attending: Internal Medicine

## 2019-12-02 ENCOUNTER — Inpatient Hospital Stay
Admission: RE | Admit: 2019-12-02 | Discharge: 2019-12-04 | DRG: 252 | Disposition: A | Discharge status: Home / Self Care | Payer: Medicaid Other | Source: Ambulatory Visit | Attending: Student | Admitting: Student

## 2019-12-02 ENCOUNTER — Other Ambulatory Visit: Payer: Self-pay

## 2019-12-02 DIAGNOSIS — E875 Hyperkalemia: Secondary | ICD-10-CM | POA: Diagnosis present

## 2019-12-02 DIAGNOSIS — Z992 Dependence on renal dialysis: Secondary | ICD-10-CM

## 2019-12-02 DIAGNOSIS — I721 Aneurysm of artery of upper extremity: Secondary | ICD-10-CM | POA: Diagnosis present

## 2019-12-02 DIAGNOSIS — Z7902 Long term (current) use of antithrombotics/antiplatelets: Secondary | ICD-10-CM

## 2019-12-02 DIAGNOSIS — R791 Abnormal coagulation profile: Secondary | ICD-10-CM | POA: Diagnosis present

## 2019-12-02 DIAGNOSIS — I10 Essential (primary) hypertension: Secondary | ICD-10-CM | POA: Diagnosis present

## 2019-12-02 DIAGNOSIS — T82858A Stenosis of vascular prosthetic devices, implants and grafts, initial encounter: Secondary | ICD-10-CM | POA: Diagnosis present

## 2019-12-02 DIAGNOSIS — N2581 Secondary hyperparathyroidism of renal origin: Secondary | ICD-10-CM | POA: Diagnosis present

## 2019-12-02 DIAGNOSIS — T82510A Breakdown (mechanical) of surgically created arteriovenous fistula, initial encounter: Principal | ICD-10-CM | POA: Diagnosis present

## 2019-12-02 DIAGNOSIS — Z881 Allergy status to other antibiotic agents status: Secondary | ICD-10-CM

## 2019-12-02 DIAGNOSIS — E785 Hyperlipidemia, unspecified: Secondary | ICD-10-CM | POA: Diagnosis present

## 2019-12-02 DIAGNOSIS — N289 Disorder of kidney and ureter, unspecified: Secondary | ICD-10-CM | POA: Diagnosis present

## 2019-12-02 DIAGNOSIS — I120 Hypertensive chronic kidney disease with stage 5 chronic kidney disease or end stage renal disease: Secondary | ICD-10-CM | POA: Diagnosis present

## 2019-12-02 DIAGNOSIS — Z79899 Other long term (current) drug therapy: Secondary | ICD-10-CM

## 2019-12-02 DIAGNOSIS — Y712 Prosthetic and other implants, materials and accessory cardiovascular devices associated with adverse incidents: Secondary | ICD-10-CM | POA: Diagnosis present

## 2019-12-02 DIAGNOSIS — I871 Compression of vein: Secondary | ICD-10-CM | POA: Diagnosis present

## 2019-12-02 DIAGNOSIS — N186 End stage renal disease: Secondary | ICD-10-CM | POA: Diagnosis present

## 2019-12-02 DIAGNOSIS — I252 Old myocardial infarction: Secondary | ICD-10-CM

## 2019-12-02 DIAGNOSIS — Z7982 Long term (current) use of aspirin: Secondary | ICD-10-CM

## 2019-12-02 DIAGNOSIS — Z5309 Procedure and treatment not carried out because of other contraindication: Secondary | ICD-10-CM | POA: Diagnosis not present

## 2019-12-02 DIAGNOSIS — Z681 Body mass index (BMI) 19 or less, adult: Secondary | ICD-10-CM

## 2019-12-02 HISTORY — DX: Acute myocardial infarction, unspecified: I21.9

## 2019-12-02 LAB — BASIC METABOLIC PANEL
Anion gap: 20 — ABNORMAL HIGH (ref 5–15)
Anion gap: 16 — ABNORMAL HIGH (ref 5–15)
BUN: 104 mg/dL — ABNORMAL HIGH (ref 6–20)
BUN: 28 mg/dL — ABNORMAL HIGH (ref 6–20)
CO2: 20 mmol/L — ABNORMAL LOW (ref 22–32)
CO2: 29 mmol/L (ref 22–32)
Calcium: 7.4 mg/dL — ABNORMAL LOW (ref 8.9–10.3)
Calcium: 8.3 mg/dL — ABNORMAL LOW (ref 8.9–10.3)
Chloride: 97 mmol/L — ABNORMAL LOW (ref 98–111)
Chloride: 92 mmol/L — ABNORMAL LOW (ref 98–111)
Creatinine, Ser: 16.39 mg/dL — ABNORMAL HIGH (ref 0.61–1.24)
Creatinine, Ser: 5.11 mg/dL — ABNORMAL HIGH (ref 0.61–1.24)
GFR calc Af Amer: 4 mL/min — ABNORMAL LOW (ref >60)
GFR calc Af Amer: 15 mL/min — ABNORMAL LOW (ref >60)
GFR calc non Af Amer: 3 mL/min — ABNORMAL LOW (ref >60)
GFR calc non Af Amer: 13 mL/min — ABNORMAL LOW (ref >60)
Glucose, Bld: 83 mg/dL (ref 70–99)
Glucose, Bld: 77 mg/dL (ref 70–99)
Potassium: 6.9 mmol/L (ref 3.5–5.1)
Potassium: 3.2 mmol/L — ABNORMAL LOW (ref 3.5–5.1)
Sodium: 137 mmol/L (ref 135–145)
Sodium: 137 mmol/L (ref 135–145)

## 2019-12-02 LAB — CBC
HCT: 42.6 % (ref 39.0–52.0)
Hemoglobin: 14.3 g/dL (ref 13.0–17.0)
MCH: 32 pg (ref 26.0–34.0)
MCHC: 33.6 g/dL (ref 30.0–36.0)
MCV: 95.3 fL (ref 80.0–100.0)
Platelets: 127 10*3/uL — ABNORMAL LOW (ref 150–400)
RBC: 4.47 MIL/uL (ref 4.22–5.81)
RDW: 13.2 % (ref 11.5–15.5)
WBC: 4.8 10*3/uL (ref 4.0–10.5)
nRBC: 0 % (ref 0.0–0.2)

## 2019-12-02 LAB — POTASSIUM (ARMC VASCULAR LAB ONLY): Potassium (ARMC vascular lab): 6.5 (ref 3.5–5.1)

## 2019-12-02 LAB — PHOSPHORUS: Phosphorus: 4 mg/dL (ref 2.5–4.6)

## 2019-12-02 SURGERY — A/V FISTULAGRAM
Anesthesia: Moderate Sedation | Laterality: Left

## 2019-12-02 MED ORDER — ACETAMINOPHEN 650 MG RE SUPP
650.0000 mg | Freq: Four times a day (QID) | RECTAL | Status: DC | PRN
  Filled 2019-12-02: qty 1

## 2019-12-02 MED ORDER — ACETAMINOPHEN 325 MG PO TABS
650.0000 mg | ORAL_TABLET | Freq: Four times a day (QID) | ORAL | Status: DC | PRN
  Administered 2019-12-02: 650 mg via ORAL
  Filled 2019-12-02: qty 2

## 2019-12-02 MED ORDER — CALCIUM GLUCONATE-NACL 1-0.675 GM/50ML-% IV SOLN
1.0000 g | Freq: Once | INTRAVENOUS | Status: AC
  Administered 2019-12-02: 1000 mg via INTRAVENOUS
  Filled 2019-12-02: qty 50

## 2019-12-02 MED ORDER — FELODIPINE ER 10 MG PO TB24
10.0000 mg | ORAL_TABLET | Freq: Every day | ORAL | Status: DC
  Filled 2019-12-02 (×3): qty 1

## 2019-12-02 MED ORDER — FAMOTIDINE 20 MG PO TABS: 40.0000 mg | ORAL_TABLET | Freq: Once | ORAL | Status: DC | PRN

## 2019-12-02 MED ORDER — POLYETHYLENE GLYCOL 3350 17 G PO PACK
17.0000 g | PACK | Freq: Every day | ORAL | Status: DC | PRN
  Filled 2019-12-02: qty 1

## 2019-12-02 MED ORDER — SODIUM CHLORIDE 0.9 % IV SOLN
INTRAVENOUS | Status: DC
  Administered 2019-12-02: 1000 mL via INTRAVENOUS

## 2019-12-02 MED ORDER — CHLORHEXIDINE GLUCONATE CLOTH 2 % EX PADS
6.0000 | MEDICATED_PAD | Freq: Every day | CUTANEOUS | Status: DC
  Administered 2019-12-04: 6 via TOPICAL

## 2019-12-02 MED ORDER — ONDANSETRON HCL 4 MG PO TABS
4.0000 mg | ORAL_TABLET | Freq: Four times a day (QID) | ORAL | Status: DC | PRN
  Filled 2019-12-02: qty 1

## 2019-12-02 MED ORDER — METHYLPREDNISOLONE SODIUM SUCC 125 MG IJ SOLR: 125.0000 mg | Freq: Once | INTRAMUSCULAR | Status: DC | PRN

## 2019-12-02 MED ORDER — ATORVASTATIN CALCIUM 20 MG PO TABS
80.0000 mg | ORAL_TABLET | Freq: Every day | ORAL | Status: DC
  Administered 2019-12-03: 80 mg via ORAL
  Filled 2019-12-02 (×3): qty 1
  Filled 2019-12-02: qty 4

## 2019-12-02 MED ORDER — SODIUM CHLORIDE 0.9 % IV SOLN: 100.0000 mL | INTRAVENOUS | Status: DC | PRN

## 2019-12-02 MED ORDER — ONDANSETRON HCL 4 MG/2ML IJ SOLN: 4.0000 mg | Freq: Four times a day (QID) | INTRAMUSCULAR | Status: DC | PRN

## 2019-12-02 MED ORDER — METOPROLOL TARTRATE 50 MG PO TABS
50.0000 mg | ORAL_TABLET | Freq: Two times a day (BID) | ORAL | Status: DC
  Administered 2019-12-02 – 2019-12-03 (×2): 50 mg via ORAL
  Filled 2019-12-02 (×2): qty 1

## 2019-12-02 MED ORDER — DIPHENHYDRAMINE HCL 50 MG/ML IJ SOLN: 50.0000 mg | Freq: Once | INTRAMUSCULAR | Status: DC | PRN

## 2019-12-02 MED ORDER — HEPARIN SODIUM (PORCINE) 1000 UNIT/ML DIALYSIS: 1000.0000 [IU] | INTRAMUSCULAR | Status: DC | PRN

## 2019-12-02 MED ORDER — HYDROMORPHONE HCL 1 MG/ML IJ SOLN: 1.0000 mg | Freq: Once | INTRAMUSCULAR | Status: DC | PRN

## 2019-12-02 MED ORDER — TRAZODONE HCL 50 MG PO TABS
50.0000 mg | ORAL_TABLET | Freq: Every evening | ORAL | Status: DC | PRN
  Filled 2019-12-02: qty 1

## 2019-12-02 MED ORDER — LIDOCAINE-PRILOCAINE 2.5-2.5 % EX CREA
1.0000 "application " | TOPICAL_CREAM | CUTANEOUS | Status: DC | PRN
  Filled 2019-12-02: qty 5

## 2019-12-02 MED ORDER — HEPARIN SODIUM (PORCINE) 1000 UNIT/ML IJ SOLN
INTRAMUSCULAR | Status: AC
  Filled 2019-12-02: qty 1

## 2019-12-02 MED ORDER — ONDANSETRON HCL 4 MG/2ML IJ SOLN
4.0000 mg | Freq: Four times a day (QID) | INTRAMUSCULAR | Status: DC | PRN
  Filled 2019-12-02: qty 2

## 2019-12-02 MED ORDER — LIDOCAINE HCL (PF) 1 % IJ SOLN
5.0000 mL | INTRAMUSCULAR | Status: DC | PRN
  Filled 2019-12-02: qty 5

## 2019-12-02 MED ORDER — MIDAZOLAM HCL 2 MG/ML PO SYRP: 8.0000 mg | ORAL_SOLUTION | Freq: Once | ORAL | Status: AC | PRN

## 2019-12-02 MED ORDER — ALTEPLASE 2 MG IJ SOLR: 2.0000 mg | Freq: Once | INTRAMUSCULAR | Status: DC | PRN

## 2019-12-02 MED ORDER — MIDAZOLAM HCL 5 MG/5ML IJ SOLN
INTRAMUSCULAR | Status: AC
  Filled 2019-12-02: qty 5

## 2019-12-02 MED ORDER — ISOSORBIDE MONONITRATE ER 30 MG PO TB24
60.0000 mg | ORAL_TABLET | Freq: Every day | ORAL | Status: DC
  Filled 2019-12-02 (×3): qty 1

## 2019-12-02 MED ORDER — LISINOPRIL 10 MG PO TABS
5.0000 mg | ORAL_TABLET | Freq: Every day | ORAL | Status: DC
  Filled 2019-12-02 (×3): qty 1

## 2019-12-02 MED ORDER — FENTANYL CITRATE (PF) 100 MCG/2ML IJ SOLN
INTRAMUSCULAR | Status: AC
  Filled 2019-12-02: qty 2

## 2019-12-02 MED ORDER — SODIUM CHLORIDE 0.9% FLUSH
3.0000 mL | Freq: Two times a day (BID) | INTRAVENOUS | Status: DC
  Administered 2019-12-02 – 2019-12-03 (×3): 3 mL via INTRAVENOUS

## 2019-12-02 MED ORDER — OXYCODONE HCL 5 MG PO TABS: 5.0000 mg | ORAL_TABLET | ORAL | Status: DC | PRN

## 2019-12-02 MED ORDER — PENTAFLUOROPROP-TETRAFLUOROETH EX AERO
1.0000 "application " | INHALATION_SPRAY | CUTANEOUS | Status: DC | PRN
  Filled 2019-12-02: qty 30

## 2019-12-02 MED ORDER — ASPIRIN 81 MG PO CHEW
81.0000 mg | CHEWABLE_TABLET | Freq: Every day | ORAL | Status: DC
  Administered 2019-12-03: 81 mg via ORAL
  Filled 2019-12-02: qty 1

## 2019-12-02 MED ORDER — MIDAZOLAM HCL 2 MG/ML PO SYRP
ORAL_SOLUTION | ORAL | Status: AC
  Administered 2019-12-02: 4 mg via ORAL
  Filled 2019-12-02: qty 4

## 2019-12-02 NOTE — Consult Note (Signed)
[facility] Admission History & Physical  MRN : [id number]  Ralph Lucero is a 44 y.o. (08/10/1975) male who presents with chief complaint of scheduled left upper extremity fistulogram possible intervention.  History of Present Illness:  I am asked to evaluate the patient by his dialysis center. The patient was sent here because dialysis center staff were unable to achieve adequate dialysis runs. The patient endorses increasing problems with the access, such as "pulling clots" during dialysis and prolonged bleeding after decannulation. The patient estimates these problems have been going on for several weeks. The patient is unaware of any other change. Patient denies pain or tenderness overlying the access. There is no pain with dialysis.  The patient denies hand pain or finger pain consistent with steal syndrome.   Patient's preprocedure labs are notable for potassium of 6.9.  The procedure was canceled and rescheduled for Wednesday.  Patient was last seen in our clinic October 2020.  Current Facility-Administered Medications  Medication Dose Route Frequency Provider Last Rate Last Admin  . calcium gluconate 1 g/ 50 mL sodium chloride IVPB  1 g Intravenous Once Clarnce Flock, MD      . Derrill Memo ON 12/03/2019] Chlorhexidine Gluconate Cloth 2 % PADS 6 each  6 each Topical Q0600 Anthonette Legato, MD       Past Medical History:  Diagnosis Date  . ESRD (end stage renal disease) on dialysis ([place])   . Hypertension   . Myocardial infarction ([place])   . Renal disorder    Past Surgical History:  Procedure Laterality Date  . A/V SHUNT INTERVENTION Left 12/26/2018   Procedure: A/V SHUNT INTERVENTION WITH PERMCATH INSERTION;  Surgeon: Algernon Huxley, MD;  Location: [facility];  Service: Cardiovascular;  Laterality: Left;  . CORONARY/GRAFT ACUTE MI REVASCULARIZATION N/A 08/30/2018   Procedure: Coronary/Graft Acute MI Revascularization;  Surgeon: Isaias Cowman, MD;  Location: [facility] CV LAB;  Service: Cardiovascular;  Laterality: N/A;  . HEMATOMA EVACUATION Right 12/21/2018   Procedure: EVACUATION RIGHT THIGH HEMATOMA WITH REPAIR OF RIGHT SFA PSEUDOANERYSM;  Surgeon: Katha Cabal, MD;  Location: [facility] ORS;  Service: Vascular;  Laterality: Right;  . LEFT HEART CATH AND CORONARY ANGIOGRAPHY N/A 08/30/2018   Procedure: LEFT HEART CATH AND CORONARY ANGIOGRAPHY;  Surgeon: Isaias Cowman, MD;  Location: [facility];  Service: Cardiovascular;  Laterality: N/A;  . TEMPORARY DIALYSIS CATHETER Left 12/18/2018   Procedure: TEMPORARY DIALYSIS CATHETER;  Surgeon: Katha Cabal, MD;  Location: [place] CV LAB;  Service: Cardiovascular;  Laterality: Left;   Social History Social History   Tobacco Use  . Smoking status: Never Smoker  . Smokeless tobacco: Never Used  Vaping Use  . Vaping Use: Never used  Substance Use Topics  . Alcohol use: Never  . Drug use: Never   Family History History reviewed. No pertinent family history.   No family history of bleeding or clotting disorders, autoimmune disease or porphyria.  Allergies  Allergen Reactions  . Vancomycin    REVIEW OF SYSTEMS (Negative unless checked)  Constitutional: [] Weight loss  [] Fever  [] Chills Cardiac: [] Chest pain   [] Chest pressure   [] Palpitations   [] Shortness of breath when laying flat   [] Shortness of breath at rest   [x] Shortness of breath with exertion. Vascular:  [] Pain in legs with walking   [] Pain in legs at rest   [] Pain in legs when laying flat   [] Claudication   [] Pain in feet when walking  [] Pain in  feet at rest  [] Pain in feet when laying flat   [] History of DVT   [] Phlebitis   [] Swelling in legs   [] Varicose veins   [] Non-healing ulcers Pulmonary:   [] Uses home oxygen   [] Productive cough   [] Hemoptysis   [] Wheeze  [] COPD   [] Asthma Neurologic:  [] Dizziness  [] Blackouts   [] Seizures   [] History of stroke   [] History of TIA  [] Aphasia    [] Temporary blindness   [] Dysphagia   [] Weakness or numbness in arms   [] Weakness or numbness in legs Musculoskeletal:  [] Arthritis   [] Joint swelling   [] Joint pain   [] Low back pain Hematologic:  [] Easy bruising  [] Easy bleeding   [] Hypercoagulable state   [] Anemic  [] Hepatitis Gastrointestinal:  [] Blood in stool   [] Vomiting blood  [] Gastroesophageal reflux/heartburn   [] Difficulty swallowing. Genitourinary:  [x] Chronic kidney disease   [] Difficult urination  [] Frequent urination  [] Burning with urination   [] Blood in urine Skin:  [] Rashes   [] Ulcers   [] Wounds Psychological:  [] History of anxiety   []  History of major depression.  Physical Examination  Vitals:   12/02/19 1112  BP: (!) 166/120  Pulse: 80  Resp: 15  Temp: 98.4 F (36.9 C)  TempSrc: Oral  SpO2: 98%  Weight: 54 kg  Height: 5\' 7"  (1.702 m)   Body mass index is 18.65 kg/m. Gen: WD/WN, NAD Head: [place]/AT, No temporalis wasting. Prominent temp pulse not noted. Ear/Nose/Throat: Hearing grossly intact, nares w/o erythema or drainage, oropharynx w/o Erythema/Exudate,  Eyes: Conjunctiva clear, sclera non-icteric Neck: Trachea midline.  No JVD.  Pulmonary:  Good air movement, respirations not labored, no use of accessory muscles.  Cardiac: RRR, normal S1, S2. Vascular:  Vessel Right Left  Radial Palpable Palpable  Ulnar Not Palpable Not Palpable  Brachial Palpable Palpable  Carotid Palpable, without bruit Palpable, without bruit   Left upper extremity: Extremities warm distally to fingers.  Access is aneurysmal and skin threatening is noted.  Faint bruit and thrill.  Skin is intact without wounds.  Gastrointestinal: soft, non-tender/non-distended. No guarding/reflex.  Musculoskeletal: M/S 5/5 throughout.  Extremities without ischemic changes.  No deformity or atrophy.  Neurologic: Sensation grossly intact in extremities.  Symmetrical.  Speech is fluent. Motor exam as listed above. Psychiatric: Judgment intact, Mood &  affect appropriate for pt's clinical situation. Dermatologic: No rashes or ulcers noted.  No cellulitis or open wounds. Lymph : No Cervical, Axillary, or Inguinal lymphadenopathy.  CBC Lab Results  Component Value Date   WBC 4.8 12/02/2019   HGB 14.3 12/02/2019   HCT 42.6 12/02/2019   MCV 95.3 12/02/2019   PLT 127 (L) 12/02/2019   BMET    Component Value Date/Time   NA 137 12/02/2019 1202   K 6.9 (HH) 12/02/2019 1202   K 3.6 07/24/2014 1020   CL 97 (L) 12/02/2019 1202   CO2 20 (L) 12/02/2019 1202   GLUCOSE 83 12/02/2019 1202   BUN 104 (H) 12/02/2019 1202   CREATININE 16.39 (H) 12/02/2019 1202   CALCIUM 7.4 (L) 12/02/2019 1202   GFRNONAA 3 (L) 12/02/2019 1202   GFRAA 4 (L) 12/02/2019 1202   Estimated Creatinine Clearance: 4.4 mL/min (A) (by C-G formula based on SCr of 16.39 mg/dL (H)).  COAG Lab Results  Component Value Date   INR 1.2 12/22/2018   INR 1.1 12/18/2018   Radiology No results found.  Assessment/Plan 1.  Complication dialysis device with thrombosis AV access:  Patient's left upper extremity dialysis access is malfunctioning.  The patient  was scheduled to undergo a left upper extremity fistulogram with possible intervention today however preprocedure labs were notable for potassium 6.9.  Due to risk of cardiac arrhythmia the procedure was canceled and rescheduled for Wednesday.  Patient will be admitted to the hospitalist team.  Vascular surgery will continue to follow.  Left upper extremity dialysis access is aneurysmal.  Skin threatening noted on exam.  I did start the discussion in regard to possible revision with creation of a new access in the future.  2.  End-stage renal disease requiring hemodialysis:   Left upper extremity fistulogram was canceled today due to hyperkalemia. Nephrology has been consulted for urgent dialysis.  3.  Hypertension:  Patient will continue medical management; nephrology is following no changes in oral  medications.  Discussed with Dr. Mayme Genta, PA-C  12/02/2019 1:56 PM

## 2019-12-02 NOTE — Progress Notes (Signed)
This note also relates to the following rows which could not be included: Pulse Rate - Cannot attach notes to unvalidated device data Resp - Cannot attach notes to unvalidated device data  Hd completed  

## 2019-12-02 NOTE — H&P (View-Only) (Signed)
[facility] Admission History & Physical  MRN : [id number]  Ralph Lucero is a 44 y.o. ([birth year]) male who presents with chief complaint of scheduled left upper extremity fistulogram possible intervention.  History of Present Illness:  I am asked to evaluate the patient by his dialysis center. The patient was sent here because dialysis center staff were unable to achieve adequate dialysis runs. The patient endorses increasing problems with the access, such as "pulling clots" during dialysis and prolonged bleeding after decannulation. The patient estimates these problems have been going on for several weeks. The patient is unaware of any other change. Patient denies pain or tenderness overlying the access. There is no pain with dialysis.  The patient denies hand pain or finger pain consistent with steal syndrome.   Patient's preprocedure labs are notable for potassium of 6.9.  The procedure was canceled and rescheduled for Wednesday.  Patient was last seen in our clinic October 2020.  Current Facility-Administered Medications  Medication Dose Route Frequency Provider Last Rate Last Admin  . calcium gluconate 1 g/ 50 mL sodium chloride IVPB  1 g Intravenous Once Clarnce Flock, MD      . Derrill Memo ON 12/03/2019] Chlorhexidine Gluconate Cloth 2 % PADS 6 each  6 each Topical Q0600 Anthonette Legato, MD       Past Medical History:  Diagnosis Date  . ESRD (end stage renal disease) on dialysis ([place])   . Hypertension   . Myocardial infarction ([place])   . Renal disorder    Past Surgical History:  Procedure Laterality Date  . A/V SHUNT INTERVENTION Left 12/26/2018   Procedure: A/V SHUNT INTERVENTION WITH PERMCATH INSERTION;  Surgeon: Algernon Huxley, MD;  Location: [facility];  Service: Cardiovascular;  Laterality: Left;  . CORONARY/GRAFT ACUTE MI REVASCULARIZATION N/A 08/30/2018   Procedure: Coronary/Graft Acute MI Revascularization;  Surgeon: Isaias Cowman, MD;  Location: [facility];  Service: Cardiovascular;  Laterality: N/A;  . HEMATOMA EVACUATION Right 12/21/2018   Procedure: EVACUATION RIGHT THIGH HEMATOMA WITH REPAIR OF RIGHT SFA PSEUDOANERYSM;  Surgeon: Katha Cabal, MD;  Location: [facility] ORS;  Service: Vascular;  Laterality: Right;  . LEFT HEART CATH AND CORONARY ANGIOGRAPHY N/A 08/30/2018   Procedure: LEFT HEART CATH AND CORONARY ANGIOGRAPHY;  Surgeon: Isaias Cowman, MD;  Location: [facility];  Service: Cardiovascular;  Laterality: N/A;  . TEMPORARY DIALYSIS CATHETER Left 12/18/2018   Procedure: TEMPORARY DIALYSIS CATHETER;  Surgeon: Katha Cabal, MD;  Location: [facility];  Service: Cardiovascular;  Laterality: Left;   Social History Social History   Tobacco Use  . Smoking status: Never Smoker  . Smokeless tobacco: Never Used  Vaping Use  . Vaping Use: Never used  Substance Use Topics  . Alcohol use: Never  . Drug use: Never   Family History History reviewed. No pertinent family history.   No family history of bleeding or clotting disorders, autoimmune disease or porphyria.  Allergies  Allergen Reactions  . Vancomycin    REVIEW OF SYSTEMS (Negative unless checked)  Constitutional: [] Weight loss  [] Fever  [] Chills Cardiac: [] Chest pain   [] Chest pressure   [] Palpitations   [] Shortness of breath when laying flat   [] Shortness of breath at rest   [x] Shortness of breath with exertion. Vascular:  [] Pain in legs with walking   [] Pain in legs at rest   [] Pain in legs when laying flat   [] Claudication   [] Pain in feet when walking  [] Pain in  feet at rest  [] Pain in feet when laying flat   [] History of DVT   [] Phlebitis   [] Swelling in legs   [] Varicose veins   [] Non-healing ulcers Pulmonary:   [] Uses home oxygen   [] Productive cough   [] Hemoptysis   [] Wheeze  [] COPD   [] Asthma Neurologic:  [] Dizziness  [] Blackouts   [] Seizures   [] History of stroke   [] History of TIA  [] Aphasia    [] Temporary blindness   [] Dysphagia   [] Weakness or numbness in arms   [] Weakness or numbness in legs Musculoskeletal:  [] Arthritis   [] Joint swelling   [] Joint pain   [] Low back pain Hematologic:  [] Easy bruising  [] Easy bleeding   [] Hypercoagulable state   [] Anemic  [] Hepatitis Gastrointestinal:  [] Blood in stool   [] Vomiting blood  [] Gastroesophageal reflux/heartburn   [] Difficulty swallowing. Genitourinary:  [x] Chronic kidney disease   [] Difficult urination  [] Frequent urination  [] Burning with urination   [] Blood in urine Skin:  [] Rashes   [] Ulcers   [] Wounds Psychological:  [] History of anxiety   []  History of major depression.  Physical Examination  Vitals:   12/02/19 1112  BP: (!) 166/120  Pulse: 80  Resp: 15  Temp: 98.4 F (36.9 C)  TempSrc: Oral  SpO2: 98%  Weight: 54 kg  Height: 5\' 7"  (1.702 m)   Body mass index is 18.65 kg/m. Gen: WD/WN, NAD Head: [place]/AT, No temporalis wasting. Prominent temp pulse not noted. Ear/Nose/Throat: Hearing grossly intact, nares w/o erythema or drainage, oropharynx w/o Erythema/Exudate,  Eyes: Conjunctiva clear, sclera non-icteric Neck: Trachea midline.  No JVD.  Pulmonary:  Good air movement, respirations not labored, no use of accessory muscles.  Cardiac: RRR, normal S1, S2. Vascular:  Vessel Right Left  Radial Palpable Palpable  Ulnar Not Palpable Not Palpable  Brachial Palpable Palpable  Carotid Palpable, without bruit Palpable, without bruit   Left upper extremity: Extremities warm distally to fingers.  Access is aneurysmal and skin threatening is noted.  Faint bruit and thrill.  Skin is intact without wounds.  Gastrointestinal: soft, non-tender/non-distended. No guarding/reflex.  Musculoskeletal: M/S 5/5 throughout.  Extremities without ischemic changes.  No deformity or atrophy.  Neurologic: Sensation grossly intact in extremities.  Symmetrical.  Speech is fluent. Motor exam as listed above. Psychiatric: Judgment intact, Mood &  affect appropriate for pt's clinical situation. Dermatologic: No rashes or ulcers noted.  No cellulitis or open wounds. Lymph : No Cervical, Axillary, or Inguinal lymphadenopathy.  CBC Lab Results  Component Value Date   WBC 4.8 12/02/2019   HGB 14.3 12/02/2019   HCT 42.6 12/02/2019   MCV 95.3 12/02/2019   PLT 127 (L) 12/02/2019   BMET    Component Value Date/Time   NA 137 12/02/2019 1202   K 6.9 (HH) 12/02/2019 1202   K 3.6 07/24/2014 1020   CL 97 (L) 12/02/2019 1202   CO2 20 (L) 12/02/2019 1202   GLUCOSE 83 12/02/2019 1202   BUN 104 (H) 12/02/2019 1202   CREATININE 16.39 (H) 12/02/2019 1202   CALCIUM 7.4 (L) 12/02/2019 1202   GFRNONAA 3 (L) 12/02/2019 1202   GFRAA 4 (L) 12/02/2019 1202   Estimated Creatinine Clearance: 4.4 mL/min (A) (by C-G formula based on SCr of 16.39 mg/dL (H)).  COAG Lab Results  Component Value Date   INR 1.2 12/22/2018   INR 1.1 12/18/2018   Radiology No results found.  Assessment/Plan 1.  Complication dialysis device with thrombosis AV access:  Patient's left upper extremity dialysis access is malfunctioning.  The patient  was scheduled to undergo a left upper extremity fistulogram with possible intervention today however preprocedure labs were notable for potassium 6.9.  Due to risk of cardiac arrhythmia the procedure was canceled and rescheduled for Wednesday.  Patient will be admitted to the hospitalist team.  Vascular surgery will continue to follow.  Left upper extremity dialysis access is aneurysmal.  Skin threatening noted on exam.  I did start the discussion in regard to possible revision with creation of a new access in the future.  2.  End-stage renal disease requiring hemodialysis:   Left upper extremity fistulogram was canceled today due to hyperkalemia. Nephrology has been consulted for urgent dialysis.  3.  Hypertension:  Patient will continue medical management; nephrology is following no changes in oral  medications.  Discussed with Dr. Mayme Genta, PA-C  12/02/2019 1:56 PM

## 2019-12-02 NOTE — Progress Notes (Signed)
This note also relates to the following rows which could not be included: Pulse Rate - Cannot attach notes to unvalidated device data Resp - Cannot attach notes to unvalidated device data SpO2 - Cannot attach notes to unvalidated device data  Hd started  

## 2019-12-02 NOTE — H&P (Signed)
[facility] History and Physical  Ralph [facility]ero KNL:[id number] DOB: Aug 08, 1975 DOA: 12/02/2019  Referring physician: Dr. Lucky Cowboy PCP: Anthonette Legato, MD   Chief Complaint: hyperkalemia  HPI: [facility]ero is a 44 y.o. male history of end-stage renal disease on dialysis Tuesday/Thursday/Friday, hypertension, who was admitted for hyperkalemia.  Patient arrived today for fistulogram due to inability to achieve adequate dialysis around at his dialysis center.  On arrival initial labs were checked and potassium was found to be 6.9, creatinine was 16.  The procedure was canceled and rescheduled for this coming Wednesday, hospitalist team was consulted for admission due to hyperkalemia, nephrology was consulted for urgent dialysis.  On my interview performed in Spanish with patient he reports that he is unclear as to why he was sent for fistulogram today.  He states that they have been able to use his line for dialysis and is unaware of any problems.  He currently denies any shortness of breath or chest pain.  Review of Systems:  Pertinent positives and negative per HPI, all others reviewed and negative   Past Medical History:  Diagnosis Date  . ESRD (end stage renal disease) on dialysis ([place])   . Hypertension   . Myocardial infarction ([place])   . Renal disorder    Past Surgical History:  Procedure Laterality Date  . A/V SHUNT INTERVENTION Left 12/26/2018   Procedure: A/V SHUNT INTERVENTION WITH PERMCATH INSERTION;  Surgeon: Algernon Huxley, MD;  Location: [facility];  Service: Cardiovascular;  Laterality: Left;  . CORONARY/GRAFT ACUTE MI REVASCULARIZATION N/A 08/30/2018   Procedure: Coronary/Graft Acute MI Revascularization;  Surgeon: Isaias Cowman, MD;  Location: [facility];  Service: Cardiovascular;  Laterality: N/A;  . HEMATOMA EVACUATION Right 12/21/2018   Procedure: EVACUATION RIGHT THIGH HEMATOMA WITH REPAIR OF RIGHT SFA PSEUDOANERYSM;   Surgeon: Katha Cabal, MD;  Location: [facility];  Service: Vascular;  Laterality: Right;  . LEFT HEART CATH AND CORONARY ANGIOGRAPHY N/A 08/30/2018   Procedure: LEFT HEART CATH AND CORONARY ANGIOGRAPHY;  Surgeon: Isaias Cowman, MD;  Location: [facility];  Service: Cardiovascular;  Laterality: N/A;  . TEMPORARY DIALYSIS CATHETER Left 12/18/2018   Procedure: TEMPORARY DIALYSIS CATHETER;  Surgeon: Katha Cabal, MD;  Location: [facility];  Service: Cardiovascular;  Laterality: Left;   Social History:  reports that he has never smoked. He has never used smokeless tobacco. He reports that he does not drink alcohol and does not use drugs.  Allergies  Allergen Reactions  . Vancomycin     History reviewed. No pertinent family history.   Prior to Admission medications   Medication Sig Start Date End Date Taking? Authorizing Provider  aspirin 81 MG chewable tablet Chew 81 mg by mouth daily.   Yes [provider]  atorvastatin (LIPITOR) 80 MG tablet Take 1 tablet (80 mg total) by mouth daily at 6 PM. 09/01/18  Yes Sudini, Srikar, MD  felodipine (PLENDIL) 10 MG 24 hr tablet Take 10 mg by mouth daily.   Yes [provider]  isosorbide mononitrate (IMDUR) 60 MG 24 hr tablet Take 60 mg by mouth daily.   Yes [provider]  lisinopril (ZESTRIL) 5 MG tablet Take 1 tablet (5 mg total) by mouth daily. 09/02/18  Yes Hillary Bow, MD  metoprolol tartrate (LOPRESSOR) 50 MG tablet Take 1 tablet (50 mg total) by mouth 2 (two) times daily. 09/01/18  Yes Hillary Bow, MD  isosorbide mononitrate (IMDUR) 60 MG 24 hr tablet Take 1 tablet (  60 mg total) by mouth daily. 09/02/18   Hillary Bow, MD  ticagrelor (BRILINTA) 90 MG TABS tablet Take 90 mg by mouth 2 (two) times daily. Patient not taking: Reported on 12/02/2019    [provider]   Physical Exam: Vitals:   12/02/19 1545 12/02/19 1600 12/02/19 1610 12/02/19 1615  BP: (!) 154/100 (!) 145/99   (!) 157/95  Pulse: 60 (!) 56 (!) 55 (!) 57  Resp: 17 13 12 14   Temp:      TempSrc:      SpO2:      Weight:      Height:        Wt Readings from Last 3 Encounters:  12/02/19 54 kg  02/04/19 57.6 kg  01/21/19 58.1 kg    General:  Appears calm and comfortable Eyes: PERRL, normal lids, irises & conjunctiva ENT: grossly normal hearing, lips & tongue Neck: no masses Cardiovascular: RRR, no m/r/g. No LE edema. Palpable thrill in L forearm fistula. Respiratory: CTA bilaterally, no w/r/r. Normal respiratory effort. Abdomen: soft, ntnd Skin: no rash or induration seen on limited exam Musculoskeletal: grossly normal tone BUE/BLE Psychiatric: grossly normal mood and affect, speech fluent and appropriate Neurologic: grossly non-focal.          Labs on Admission:  Basic Metabolic Panel: Recent Labs  Lab 12/02/19 1202 12/02/19 1605  NA 137  --   K 6.9*  --   CL 97*  --   CO2 20*  --   GLUCOSE 83  --   BUN 104*  --   CREATININE 16.39*  --   CALCIUM 7.4*  --   PHOS  --  4.0   Liver Function Tests: No results for input(s): AST, ALT, ALKPHOS, BILITOT, PROT, ALBUMIN in the last 168 hours. No results for input(s): LIPASE, AMYLASE in the last 168 hours. No results for input(s): AMMONIA in the last 168 hours. CBC: Recent Labs  Lab 12/02/19 1202  WBC 4.8  HGB 14.3  HCT 42.6  MCV 95.3  PLT 127*   Cardiac Enzymes: No results for input(s): CKTOTAL, CKMB, CKMBINDEX, TROPONINI in the last 168 hours.  BNP (last 3 results) Recent Labs    12/17/18 2348  BNP 1,555.0*    ProBNP (last 3 results) No results for input(s): PROBNP in the last 8760 hours.  CBG: No results for input(s): GLUCAP in the last 168 hours.  Radiological Exams on Admission: No results found.  EKG: Independently reviewed.  Sinus rhythm.  Q waves in inferior leads, T wave inversions in aVL and V5 V6, some question of mild peaking of T waves though not diffuse.  Compared to prior no significant  change  Assessment/Plan Active Problems:   ESRD (end stage renal disease) ([place])   Hypertension   Hyperkalemia  #Hyperkalemia #ESRD Patient presenting with hyperkalemia to 6.9, he denies missing any dialysis sessions though there is report that they have been unable to get good runs recently at his dialysis center likely due to problems with his fistula.  Patient given 1 g calcium gluconate and taken urgently for dialysis.  Renal is following appreciate the recommendations.  Vascular also following. -Follow-up post hemodialysis BMP and need for further potassium lowering therapies -MiraLAX daily -Continue aspirin daily -Follow-up renal recs -Follow-up vascular recommendations   #Chronic medical problems Hypertension: Continue Imdur, lisinopril, metoprolol, felodipine Hyperlipidemia: Continue atorvastatin   Code Status: Full Code DVT Prophylaxis: SCDs Family Communication: none, patient declined Disposition Plan: Inpatient  Time spent: 50 min  Annice Needy  Dione Plover MD/MPH [facility]

## 2019-12-03 ENCOUNTER — Other Ambulatory Visit (INDEPENDENT_AMBULATORY_CARE_PROVIDER_SITE_OTHER): Payer: Self-pay | Admitting: Vascular Surgery

## 2019-12-03 DIAGNOSIS — E875 Hyperkalemia: Secondary | ICD-10-CM

## 2019-12-03 DIAGNOSIS — I10 Essential (primary) hypertension: Secondary | ICD-10-CM

## 2019-12-03 DIAGNOSIS — N186 End stage renal disease: Secondary | ICD-10-CM

## 2019-12-03 LAB — BASIC METABOLIC PANEL
Anion gap: 17 — ABNORMAL HIGH (ref 5–15)
BUN: 52 mg/dL — ABNORMAL HIGH (ref 6–20)
CO2: 29 mmol/L (ref 22–32)
Calcium: 7.5 mg/dL — ABNORMAL LOW (ref 8.9–10.3)
Chloride: 91 mmol/L — ABNORMAL LOW (ref 98–111)
Creatinine, Ser: 10.39 mg/dL — ABNORMAL HIGH (ref 0.61–1.24)
GFR calc Af Amer: 6 mL/min — ABNORMAL LOW (ref >60)
GFR calc non Af Amer: 5 mL/min — ABNORMAL LOW (ref >60)
Glucose, Bld: 77 mg/dL (ref 70–99)
Potassium: 4.6 mmol/L (ref 3.5–5.1)
Sodium: 137 mmol/L (ref 135–145)

## 2019-12-03 LAB — CBC
HCT: 46.1 % (ref 39.0–52.0)
Hemoglobin: 15.3 g/dL (ref 13.0–17.0)
MCH: 31.5 pg (ref 26.0–34.0)
MCHC: 33.2 g/dL (ref 30.0–36.0)
MCV: 95.1 fL (ref 80.0–100.0)
Platelets: 121 10*3/uL — ABNORMAL LOW (ref 150–400)
RBC: 4.85 MIL/uL (ref 4.22–5.81)
RDW: 13 % (ref 11.5–15.5)
WBC: 5.7 10*3/uL (ref 4.0–10.5)
nRBC: 0 % (ref 0.0–0.2)

## 2019-12-03 LAB — PHOSPHORUS: Phosphorus: 7.1 mg/dL — ABNORMAL HIGH (ref 2.5–4.6)

## 2019-12-03 LAB — GLUCOSE, CAPILLARY: Glucose-Capillary: 82 mg/dL (ref 70–99)

## 2019-12-03 LAB — PARATHYROID HORMONE, INTACT (NO CA): PTH: 95 pg/mL — ABNORMAL HIGH (ref 15–65)

## 2019-12-03 NOTE — Progress Notes (Signed)
PROGRESS NOTE    Ralph Lucero    Code Status: Full Code  PFX:[id number] DOB: [birth year] DOA: 12/02/2019 LOS: 1 days  PCP: Anthonette Legato, MD CC: No chief complaint on file.      Hospital Summary   This is a very pleasant Spanish-speaking 44 year old male with a past medical history of ESRD on HD TTS, hypertension who was scheduled for a left upper extremity fistulogram with possible intervention however preprocedure labs at the dialysis center were notable for a potassium of 6.9 and so the procedure was canceled and he was admitted to the hospitalist team.  He had calcium gluconate and underwent urgent dialysis on 8/23.  Vascular surgery was consulted.    A & P   Active Problems:   ESRD (end stage renal disease) ([facility])   Hypertension   Hyperkalemia   1. ESRD on HD with complication of LUE dialysis access a. Plan for LUE fistulogram with possible intervention, possible permacath insertion tomorrow with Dr. Lucky Cowboy, vascular surgery  2. Hyperkalemia a. K6.9 in dialysis center on preprocedure labs requiring calcium gluconate and urgent dialysis on 8/23 b. 4.6 today c. Nephrology on board  3. Hypertension stable a. Continue current management  4. Hyperlipidemia a. Continue aspirin and atorvastatin   DVT prophylaxis: SCDs Start: 12/02/19 1737   Family Communication: No family at bedside  Disposition Plan:  Status is: Inpatient  Remains inpatient appropriate because:Inpatient level of care appropriate due to severity of illness   Dispo: The patient is from: Home              Anticipated d/c is to: Home              Anticipated d/c date is: 2 days              Patient currently is not medically stable to d/c.          Pressure injury documentation    None  Consultants  Vascular surgery Nephrology   Procedures  None  Antibiotics   Anti-infectives (From admission, onward)   None        Subjective   A Spanish interpreter helped  translate  Patient seen and examined at bedside in no acute distress and resting comfortably.  Denies any acute complaints at this time.   Objective   Vitals:   12/03/19 1630 12/03/19 1645 12/03/19 1653 12/03/19 1834  BP: (!) 140/111 (!) 126/97 (!) 126/94 (!) 144/101  Pulse: 72 69 70 70  Resp: 13 15 15 16   Temp:   97.6 F (36.4 C) 98.1 F (36.7 C)  TempSrc:   Oral Oral  SpO2: 98% 98% 98% 100%  Weight:      Height:        Intake/Output Summary (Last 24 hours) at 12/03/2019 2049 Last data filed at 12/03/2019 1653 Gross per 24 hour  Intake 240 ml  Output 749 ml  Net -509 ml   Filed Weights   12/02/19 1112 12/03/19 0500  Weight: 54 kg 54.3 kg    Examination:  Physical Exam Vitals and nursing note reviewed.  Constitutional:      Appearance: Normal appearance.  HENT:     Head: Normocephalic and atraumatic.  Eyes:     Conjunctiva/sclera: Conjunctivae normal.  Cardiovascular:     Rate and Rhythm: Normal rate and regular rhythm.  Pulmonary:     Effort: Pulmonary effort is normal.     Breath sounds: Normal breath sounds.  Abdominal:     General: Abdomen is flat.  Palpations: Abdomen is soft.  Musculoskeletal:     Comments: LUE dilated veins  Skin:    Coloration: Skin is not jaundiced or pale.  Neurological:     Mental Status: He is alert. Mental status is at baseline.  Psychiatric:        Mood and Affect: Mood normal.        Behavior: Behavior normal.     Data Reviewed: I have personally reviewed following labs and imaging studies  CBC: Recent Labs  Lab 12/02/19 1202 12/03/19 0428  WBC 4.8 5.7  HGB 14.3 15.3  HCT 42.6 46.1  MCV 95.3 95.1  PLT 127* 485*   Basic Metabolic Panel: Recent Labs  Lab 12/02/19 1202 12/02/19 1605 12/02/19 1809 12/03/19 0428 12/03/19 1349  NA 137  --  137 137  --   K 6.9*  --  3.2* 4.6  --   CL 97*  --  92* 91*  --   CO2 20*  --  29 29  --   GLUCOSE 83  --  77 77  --   BUN 104*  --  28* 52*  --   CREATININE 16.39*   --  5.11* 10.39*  --   CALCIUM 7.4*  --  8.3* 7.5*  --   PHOS  --  4.0  --   --  7.1*   GFR: Estimated Creatinine Clearance: 7 mL/min (A) (by C-G formula based on SCr of 10.39 mg/dL (H)). Liver Function Tests: No results for input(s): AST, ALT, ALKPHOS, BILITOT, PROT, ALBUMIN in the last 168 hours. No results for input(s): LIPASE, AMYLASE in the last 168 hours. No results for input(s): AMMONIA in the last 168 hours. Coagulation Profile: No results for input(s): INR, PROTIME in the last 168 hours. Cardiac Enzymes: No results for input(s): CKTOTAL, CKMB, CKMBINDEX, TROPONINI in the last 168 hours. BNP (last 3 results) No results for input(s): PROBNP in the last 8760 hours. HbA1C: No results for input(s): HGBA1C in the last 72 hours. CBG: Recent Labs  Lab 12/02/19 1740  GLUCAP 82   Lipid Profile: No results for input(s): CHOL, HDL, LDLCALC, TRIG, CHOLHDL, LDLDIRECT in the last 72 hours. Thyroid Function Tests: No results for input(s): TSH, T4TOTAL, FREET4, T3FREE, THYROIDAB in the last 72 hours. Anemia Panel: No results for input(s): VITAMINB12, FOLATE, FERRITIN, TIBC, IRON, RETICCTPCT in the last 72 hours. Sepsis Labs: No results for input(s): PROCALCITON, LATICACIDVEN in the last 168 hours.  Recent Results (from the past 240 hour(s))  SARS CORONAVIRUS 2 (TAT 6-24 HRS) Nasopharyngeal Nasopharyngeal Swab     Status: None   Collection Time: 11/29/19 11:15 AM   Specimen: Nasopharyngeal Swab  Result Value Ref Range Status   SARS Coronavirus 2 NEGATIVE NEGATIVE Final    Comment: (NOTE) SARS-CoV-2 target nucleic acids are NOT DETECTED.  The SARS-CoV-2 RNA is generally detectable in upper and lower respiratory specimens during the acute phase of infection. Negative results do not preclude SARS-CoV-2 infection, do not rule out co-infections with other pathogens, and should not be used as the sole basis for treatment or other patient management decisions. Negative results must be  combined with clinical observations, patient history, and epidemiological information. The expected result is Negative.  Fact Sheet for Patients: SugarRoll.be  Fact Sheet for Healthcare Providers: https://www.woods-mathews.com/  This test is not yet approved or cleared by the [place] FDA and  has been authorized for detection and/or diagnosis of SARS-CoV-2 by FDA under an Emergency Use Authorization (EUA). This EUA will remain  in effect (meaning this test can be used) for the duration of the COVID-19 declaration under Se ction 564(b)(1) of the Act, 21 U.S.C. section 360bbb-3(b)(1), unless the authorization is terminated or revoked sooner.  Performed at [facility], [place] [address] [place], [place] [address]          Radiology Studies: No results found.      Scheduled Meds:  aspirin  81 mg Oral Daily   atorvastatin  80 mg Oral q1800   Chlorhexidine Gluconate Cloth  6 each Topical Q0600   felodipine  10 mg Oral Daily   isosorbide mononitrate  60 mg Oral Daily   lisinopril  5 mg Oral Daily   metoprolol tartrate  50 mg Oral BID   sodium chloride flush  3 mL Intravenous Q12H   Continuous Infusions:   Time spent: 25 minutes with over 50% of the time coordinating the patient's care    Harold Hedge, DO [facility] Pager [phone]  Call night coverage person covering after 7pm

## 2019-12-03 NOTE — Progress Notes (Signed)
Hemodialysis patient known at [facility] 5:45am. Patient normally transports self or has family transport to treatments. Please contact me with any dialysis placement concerns.  Elvera Bicker Dialysis Coordinator [phone]

## 2019-12-03 NOTE — Clinical Social Work Note (Signed)
CSW acknowledges consult for medication assistance. Will follow for discharge medication needs.  Dayton Scrape, [place]

## 2019-12-03 NOTE — Progress Notes (Signed)
[facility]  ROUNDING NOTE   Subjective:  44 year old gentleman well known to this practice has past medical history of ESRD on dialysis TTS schedule. He came in yesterday for a fistulogram but procedure postponed due to hyperkalemia of 6.9. Patient got treated for hyperkalemia with dialysis and Calcium gluconate. Today patient's K+ is down to 4.6.   Objective:  Vital signs in last 24 hours:  Temp:  [98 F (36.7 C)-98.6 F (37 C)] 98.5 F (36.9 C) (08/24 1215) Pulse Rate:  [55-73] 68 (08/24 1216) Resp:  [12-21] 15 (08/24 1215) BP: (130-159)/(93-107) 132/93 (08/24 1216) SpO2:  [99 %-100 %] 99 % (08/24 1216) Weight:  [54.3 kg] 54.3 kg (08/24 0500)  Weight change:  Filed Weights   12/02/19 1112 12/03/19 0500  Weight: 54 kg 54.3 kg    Intake/Output: I/O last 3 completed shifts: In: 240 [P.O.:240] Out: 1500 [Other:1500]   Intake/Output this shift:  Total I/O In: 120 [P.O.:120] Out: -   Physical Exam: General:  Sitting in bed, pleasant and cooperative  Head:  Moist oral mucosal membranes  Eyes:  Anicteric  Neck:  Supple  Lungs:   Lungs clear, Respiratory effort normal and symmetrical  Heart:  Regular rate and rhythm,S1S2 no rubs or gallops  Abdomen:   Soft, nontender, non distended  Extremities:  No peripheral edema.  Neurologic:  Awake, alert, oriented  Skin:  No new rashes or lesions   Access:  Left FA AVF with palpable thrill, Bruit +    Basic Metabolic Panel: Recent Labs  Lab 12/02/19 1202 12/02/19 1605 12/02/19 1809 12/03/19 0428  NA 137  --  137 137  K 6.9*  --  3.2* 4.6  CL 97*  --  92* 91*  CO2 20*  --  29 29  GLUCOSE 83  --  77 77  BUN 104*  --  28* 52*  CREATININE 16.39*  --  5.11* 10.39*  CALCIUM 7.4*  --  8.3* 7.5*  PHOS  --  4.0  --   --     Liver Function Tests: No results for input(s): AST, ALT, ALKPHOS, BILITOT, PROT, ALBUMIN in the last 168 hours. No results for input(s): LIPASE, AMYLASE in the last 168 hours. No results  for input(s): AMMONIA in the last 168 hours.  CBC: Recent Labs  Lab 12/02/19 1202 12/03/19 0428  WBC 4.8 5.7  HGB 14.3 15.3  HCT 42.6 46.1  MCV 95.3 95.1  PLT 127* 121*    Cardiac Enzymes: No results for input(s): CKTOTAL, CKMB, CKMBINDEX, TROPONINI in the last 168 hours.  BNP: Invalid input(s): POCBNP  CBG: Recent Labs  Lab 12/02/19 1740  GLUCAP 82    Microbiology: Results for orders placed or performed during the hospital encounter of 11/29/19  SARS CORONAVIRUS 2 (TAT 6-24 HRS) Nasopharyngeal Nasopharyngeal Swab     Status: None   Collection Time: 11/29/19 11:15 AM   Specimen: Nasopharyngeal Swab  Result Value Ref Range Status   SARS Coronavirus 2 NEGATIVE NEGATIVE Final    Comment: (NOTE) SARS-CoV-2 target nucleic acids are NOT DETECTED.  The SARS-CoV-2 RNA is generally detectable in upper and lower respiratory specimens during the acute phase of infection. Negative results do not preclude SARS-CoV-2 infection, do not rule out co-infections with other pathogens, and should not be used as the sole basis for treatment or other patient management decisions. Negative results must be combined with clinical observations, patient history, and epidemiological information. The expected result is Negative.  Fact Sheet for  Patients: SugarRoll.be  Fact Sheet for Healthcare Providers: https://www.woods-mathews.com/  This test is not yet approved or cleared by the [place] FDA and  has been authorized for detection and/or diagnosis of SARS-CoV-2 by FDA under an Emergency Use Authorization (EUA). This EUA will remain  in effect (meaning this test can be used) for the duration of the COVID-19 declaration under Se ction 564(b)(1) of the Act, 21 U.S.C. section 360bbb-3(b)(1), unless the authorization is terminated or revoked sooner.  Performed at [facility], [place] [address], [place], [place] [address]      Coagulation Studies: No results for input(s): LABPROT, INR in the last 72 hours.  Urinalysis: No results for input(s): COLORURINE, LABSPEC, PHURINE, GLUCOSEU, HGBUR, BILIRUBINUR, KETONESUR, PROTEINUR, UROBILINOGEN, NITRITE, LEUKOCYTESUR in the last 72 hours.  Invalid input(s): APPERANCEUR    Imaging: No results found.   Medications:    . aspirin  81 mg Oral Daily  . atorvastatin  80 mg Oral q1800  . Chlorhexidine Gluconate Cloth  6 each Topical Q0600  . felodipine  10 mg Oral Daily  . isosorbide mononitrate  60 mg Oral Daily  . lisinopril  5 mg Oral Daily  . metoprolol tartrate  50 mg Oral BID  . sodium chloride flush  3 mL Intravenous Q12H   acetaminophen **OR** acetaminophen, ondansetron **OR** ondansetron (ZOFRAN) IV, oxyCODONE, polyethylene glycol, traZODone  Assessment/ Plan:  44 y.o. male with ESRD on TTS schedule, admitted for hyperkalemia which was noted when he came for fistulogram .  # End-stage renal disease on TTS Patient was on schedule yesterday to undergo a fistulogram however was hyperkalemic,treated with calcium gluconate and  potassium  down to 4.6 today   Plan  dialysis today for 2 hours Spoke to the patient with the assistance of an interpreter to obtain information and discuss about the plan  # Hyperkalemia  Patient with potassium of 6.9 prior to procedure yesterday. Fistulogram canceled.  Patient treated with calcium gluconate. Potassium 4.6 today. Will continue monitoring   Secondary hyperparathyroidism. Phosphorus high at 7.1.  Repeat serum phosphorus tomorrow.  LOS: 1 Anesia Blackwell 8/24/20211:09 PM

## 2019-12-03 NOTE — Progress Notes (Signed)
[facility] Daily Progress Note   Subjective: [facility] without complaint this AM.  No issues overnight.  [facility] for dialysis later this afternoon.  Objective: Vitals:   12/03/19 0059 12/03/19 0500 12/03/19 0555 12/03/19 0843  BP: (!) 141/104  (!) 143/96 (!) 133/98  Pulse: 61  64 68  Resp: 14  16 18   Temp: 98.5 F (36.9 C)  98 F (36.7 C) 98.3 F (36.8 C)  TempSrc:   Oral   SpO2: 99%  100% 100%  Weight:  54.3 kg    Height:        Intake/Output Summary (Last 24 hours) at 12/03/2019 1108 Last data filed at 12/03/2019 0900 Gross per 24 hour  Intake 360 ml  Output 1500 ml  Net -1140 ml   Physical Exam: A&Ox3, NAD CV: RRR Pulmonary: CTA Bilaterally Abdomen: Soft, Nontender, Nondistended Vascular:  Left upper extremity: Access site is aneurysmal.  Skin is intact. Bruit and thrill noted.   Laboratory: CBC    Component Value Date/Time   WBC 5.7 12/03/2019 0428   HGB 15.3 12/03/2019 0428   HCT 46.1 12/03/2019 0428   PLT 121 (L) 12/03/2019 0428   BMET    Component Value Date/Time   NA 137 12/03/2019 0428   K 4.6 12/03/2019 0428   K 3.6 07/24/2014 1020   CL 91 (L) 12/03/2019 0428   CO2 29 12/03/2019 0428   GLUCOSE 77 12/03/2019 0428   BUN 52 (H) 12/03/2019 0428   CREATININE 10.39 (H) 12/03/2019 0428   CALCIUM 7.5 (L) 12/03/2019 0428   GFRNONAA 5 (L) 12/03/2019 0428   GFRAA 6 (L) 12/03/2019 0428   Assessment/Planning: The Ralph Lucero is a 44 year old male with known history of end-stage renal disease currently maintained by left upper extremity dialysis access that is currently malfunctioning  1) End-stage renal disease: Ralph [facility]ero was on schedule yesterday to undergo a fistulogram however was hyperkalemic.  Treated with calcium gluconate now with potassium of 4.3 this AM.  Ralph [facility]ero for dialysis this afternoon.  We will plan on left upper extremity fistulogram with possible intervention, possible PermCath insertion tomorrow with Dr. Lucky Cowboy.  Spoke to the  Ralph [facility]ero with the assistance of an interpreter to discuss the procedure, risks and benefits.  All his questions were answered.  Ralph Lucero wishes to proceed.  2) Hyperkalemia: [facility] with potassium of 6.9 prior to procedure yesterday.  Fistulogram canceled.  R[facility] treated with calcium gluconate.  Potassium 4.2 this AM.  [facility] for dialysis that afternoon.  We will plan on proceeding with procedure tomorrow. BMP in AM.   Discussed with Dr. Ellis Parents [facility] PA-C 12/03/2019 11:08 AM

## 2019-12-04 ENCOUNTER — Telehealth (INDEPENDENT_AMBULATORY_CARE_PROVIDER_SITE_OTHER): Payer: Self-pay

## 2019-12-04 ENCOUNTER — Encounter
Admission: RE | Disposition: A | Discharge status: Home / Self Care | Payer: Self-pay | Source: Ambulatory Visit | Attending: Internal Medicine

## 2019-12-04 ENCOUNTER — Encounter: Payer: Self-pay | Admitting: Vascular Surgery

## 2019-12-04 DIAGNOSIS — T82898A Other specified complication of vascular prosthetic devices, implants and grafts, initial encounter: Secondary | ICD-10-CM

## 2019-12-04 DIAGNOSIS — N186 End stage renal disease: Secondary | ICD-10-CM

## 2019-12-04 DIAGNOSIS — I770 Arteriovenous fistula, acquired: Secondary | ICD-10-CM

## 2019-12-04 DIAGNOSIS — Z992 Dependence on renal dialysis: Secondary | ICD-10-CM

## 2019-12-04 HISTORY — PX: A/V FISTULAGRAM: CATH118298

## 2019-12-04 LAB — CBC
HCT: 49 % (ref 39.0–52.0)
Hemoglobin: 16.9 g/dL (ref 13.0–17.0)
MCH: 32 pg (ref 26.0–34.0)
MCHC: 34.5 g/dL (ref 30.0–36.0)
MCV: 92.8 fL (ref 80.0–100.0)
Platelets: 145 10*3/uL — ABNORMAL LOW (ref 150–400)
RBC: 5.28 MIL/uL (ref 4.22–5.81)
RDW: 13.2 % (ref 11.5–15.5)
WBC: 4.9 10*3/uL (ref 4.0–10.5)
nRBC: 0 % (ref 0.0–0.2)

## 2019-12-04 LAB — BASIC METABOLIC PANEL
Anion gap: 17 — ABNORMAL HIGH (ref 5–15)
BUN: 51 mg/dL — ABNORMAL HIGH (ref 6–20)
CO2: 29 mmol/L (ref 22–32)
Calcium: 7.3 mg/dL — ABNORMAL LOW (ref 8.9–10.3)
Chloride: 92 mmol/L — ABNORMAL LOW (ref 98–111)
Creatinine, Ser: 10.13 mg/dL — ABNORMAL HIGH (ref 0.61–1.24)
GFR calc Af Amer: 6 mL/min — ABNORMAL LOW (ref >60)
GFR calc non Af Amer: 6 mL/min — ABNORMAL LOW (ref >60)
Glucose, Bld: 82 mg/dL (ref 70–99)
Potassium: 4.1 mmol/L (ref 3.5–5.1)
Sodium: 138 mmol/L (ref 135–145)

## 2019-12-04 SURGERY — A/V FISTULAGRAM
Anesthesia: Moderate Sedation | Laterality: Left

## 2019-12-04 MED ORDER — MIDAZOLAM HCL 2 MG/ML PO SYRP: 8.0000 mg | ORAL_SOLUTION | Freq: Once | ORAL | Status: DC | PRN

## 2019-12-04 MED ORDER — ONDANSETRON HCL 4 MG/2ML IJ SOLN: 4.0000 mg | Freq: Four times a day (QID) | INTRAMUSCULAR | Status: DC | PRN

## 2019-12-04 MED ORDER — DIPHENHYDRAMINE HCL 50 MG/ML IJ SOLN: 50.0000 mg | Freq: Once | INTRAMUSCULAR | Status: DC | PRN

## 2019-12-04 MED ORDER — MIDAZOLAM HCL 5 MG/5ML IJ SOLN
INTRAMUSCULAR | Status: AC
  Filled 2019-12-04: qty 5

## 2019-12-04 MED ORDER — METHYLPREDNISOLONE SODIUM SUCC 125 MG IJ SOLR: 125.0000 mg | Freq: Once | INTRAMUSCULAR | Status: DC | PRN

## 2019-12-04 MED ORDER — HEPARIN SODIUM (PORCINE) 1000 UNIT/ML IJ SOLN
INTRAMUSCULAR | Status: DC | PRN
  Administered 2019-12-04: 3000 [IU] via INTRAVENOUS

## 2019-12-04 MED ORDER — FENTANYL CITRATE (PF) 100 MCG/2ML IJ SOLN
INTRAMUSCULAR | Status: AC
  Filled 2019-12-04: qty 2

## 2019-12-04 MED ORDER — SODIUM CHLORIDE 0.9 % IV SOLN: INTRAVENOUS | Status: DC

## 2019-12-04 MED ORDER — HYDROMORPHONE HCL 1 MG/ML IJ SOLN: 1.0000 mg | Freq: Once | INTRAMUSCULAR | Status: DC | PRN

## 2019-12-04 MED ORDER — HEPARIN SODIUM (PORCINE) 1000 UNIT/ML IJ SOLN
INTRAMUSCULAR | Status: AC
  Filled 2019-12-04: qty 1

## 2019-12-04 MED ORDER — CEFAZOLIN SODIUM-DEXTROSE 1-4 GM/50ML-% IV SOLN
1.0000 g | Freq: Once | INTRAVENOUS | Status: AC
  Administered 2019-12-04: 1 g via INTRAVENOUS
  Filled 2019-12-04: qty 50

## 2019-12-04 MED ORDER — FENTANYL CITRATE (PF) 100 MCG/2ML IJ SOLN
INTRAMUSCULAR | Status: DC | PRN
Start: 2019-12-04 — End: 2019-12-04
  Administered 2019-12-04 (×2): 50 ug via INTRAVENOUS

## 2019-12-04 MED ORDER — MIDAZOLAM HCL 2 MG/2ML IJ SOLN
INTRAMUSCULAR | Status: DC | PRN
  Administered 2019-12-04 (×2): 2 mg via INTRAVENOUS

## 2019-12-04 MED ORDER — FAMOTIDINE 20 MG PO TABS: 40.0000 mg | ORAL_TABLET | Freq: Once | ORAL | Status: DC | PRN

## 2019-12-04 SURGICAL SUPPLY — 12 items
BALLN MUSTANG 6X60X75 (BALLOONS) ×3
BALLN ULTRVRSE 8X80X75 (BALLOONS) ×3
BALLOON MUSTANG 6X60X75 (BALLOONS) IMPLANT
BALLOON ULTRVRSE 8X80X75 (BALLOONS) IMPLANT
CANNULA 5F STIFF (CANNULA) ×4 IMPLANT
COVER PROBE U/S 5X48 (MISCELLANEOUS) ×2 IMPLANT
DEVICE INFLAT 30 PLUS (MISCELLANEOUS) ×2 IMPLANT
DRAPE BRACHIAL (DRAPES) ×2 IMPLANT
PACK ANGIOGRAPHY (CUSTOM PROCEDURE TRAY) ×3 IMPLANT
SHEATH BRITE TIP 6FRX5.5 (SHEATH) ×2 IMPLANT
SUT MNCRL AB 4-0 PS2 18 (SUTURE) ×4 IMPLANT
WIRE MAGIC TOR.035 180C (WIRE) ×2 IMPLANT

## 2019-12-04 NOTE — Progress Notes (Signed)
[facility]  ROUNDING NOTE   Subjective:  Patient is well known to this practice has past medical history of ESRD on dialysis TTS schedule. He came in on  for a fistulogram on 12/02/19,  but procedure postponed due to hyperkalemia of 6.9. Patient got treated for hyperkalemia with dialysis and Calcium gluconate.   Patient received dialysis yesterday. Today patient's K+ is down to 4.1   Objective:  Vital signs in last 24 hours:  Temp:  [97.6 F (36.4 C)-98.5 F (36.9 C)] 97.7 F (36.5 C) (08/25 0600) Pulse Rate:  [62-73] 62 (08/25 0933) Resp:  [11-17] 14 (08/25 0600) BP: (124-144)/(93-111) 126/102 (08/25 0933) SpO2:  [98 %-100 %] 100 % (08/25 0600) Weight:  [54.4 kg] 54.4 kg (08/25 0500)  Weight change: 0.432 kg Filed Weights   12/02/19 1112 12/03/19 0500 12/04/19 0500  Weight: 54 kg 54.3 kg 54.4 kg    Intake/Output: I/O last 3 completed shifts: In: 480 [P.O.:480] Out: 749 [Other:749]   Intake/Output this shift:  No intake/output data recorded.  Physical Exam: General:  Lying in bed, in no acute distress  Head:  Normocephalic,atraumatic  Eyes:  Sclerae and conjunctivae clear  Neck:  Supple  Lungs:   Clear bilaterally, Normal ,symmetrical respiratory effort  Heart:  SI S2,Regular rate and rhythm  Abdomen:   Soft, nontender, non distended  Extremities:  No peripheral edema.  Neurologic:  Awake, alert, oriented  Skin:  No new rashes or lesions   Access:  Left FA AVF with palpable thrill, Bruit +    Basic Metabolic Panel: Recent Labs  Lab 12/02/19 1202 12/02/19 1202 12/02/19 1605 12/02/19 1809 12/03/19 0428 12/03/19 1349 12/04/19 0455  NA 137  --   --  137 137  --  138  K 6.9*  --   --  3.2* 4.6  --  4.1  CL 97*  --   --  92* 91*  --  92*  CO2 20*  --   --  29 29  --  29  GLUCOSE 83  --   --  77 77  --  82  BUN 104*  --   --  28* 52*  --  51*  CREATININE 16.39*  --   --  5.11* 10.39*  --  10.13*  CALCIUM 7.4*   < >  --  8.3* 7.5*  --  7.3*   PHOS  --   --  4.0  --   --  7.1*  --    < > = values in this interval not displayed.    Liver Function Tests: No results for input(s): AST, ALT, ALKPHOS, BILITOT, PROT, ALBUMIN in the last 168 hours. No results for input(s): LIPASE, AMYLASE in the last 168 hours. No results for input(s): AMMONIA in the last 168 hours.  CBC: Recent Labs  Lab 12/02/19 1202 12/03/19 0428 12/04/19 0455  WBC 4.8 5.7 4.9  HGB 14.3 15.3 16.9  HCT 42.6 46.1 49.0  MCV 95.3 95.1 92.8  PLT 127* 121* 145*    Cardiac Enzymes: No results for input(s): CKTOTAL, CKMB, CKMBINDEX, TROPONINI in the last 168 hours.  BNP: Invalid input(s): POCBNP  CBG: Recent Labs  Lab 12/02/19 1740  GLUCAP 82    Microbiology: Results for orders placed or performed during the hospital encounter of 11/29/19  SARS CORONAVIRUS 2 (TAT 6-24 HRS) Nasopharyngeal Nasopharyngeal Swab     Status: None   Collection Time: 11/29/19 11:15 AM   Specimen: Nasopharyngeal Swab  Result Value Ref  Range Status   SARS Coronavirus 2 NEGATIVE NEGATIVE Final    Comment: (NOTE) SARS-CoV-2 target nucleic acids are NOT DETECTED.  The SARS-CoV-2 RNA is generally detectable in upper and lower respiratory specimens during the acute phase of infection. Negative results do not preclude SARS-CoV-2 infection, do not rule out co-infections with other pathogens, and should not be used as the sole basis for treatment or other patient management decisions. Negative results must be combined with clinical observations, patient history, and epidemiological information. The expected result is Negative.  Fact Sheet for Patients: SugarRoll.be  Fact Sheet for Healthcare Providers: https://www.woods-mathews.com/  This test is not yet approved or cleared by the [place] FDA and  has been authorized for detection and/or diagnosis of SARS-CoV-2 by FDA under an Emergency Use Authorization (EUA). This EUA will  remain  in effect (meaning this test can be used) for the duration of the COVID-19 declaration under Se ction 564(b)(1) of the Act, 21 U.S.C. section 360bbb-3(b)(1), unless the authorization is terminated or revoked sooner.  Performed at [facility], [place] [address] [place], [place] [address]     Coagulation Studies: No results for input(s): LABPROT, INR in the last 72 hours.  Urinalysis: No results for input(s): COLORURINE, LABSPEC, PHURINE, GLUCOSEU, HGBUR, BILIRUBINUR, KETONESUR, PROTEINUR, UROBILINOGEN, NITRITE, LEUKOCYTESUR in the last 72 hours.  Invalid input(s): APPERANCEUR    Imaging: No results found.   Medications:   .  ceFAZolin (ANCEF) IV     . aspirin  81 mg Oral Daily  . atorvastatin  80 mg Oral q1800  . Chlorhexidine Gluconate Cloth  6 each Topical Q0600  . felodipine  10 mg Oral Daily  . isosorbide mononitrate  60 mg Oral Daily  . lisinopril  5 mg Oral Daily  . metoprolol tartrate  50 mg Oral BID  . sodium chloride flush  3 mL Intravenous Q12H   acetaminophen **OR** acetaminophen, ondansetron **OR** ondansetron (ZOFRAN) IV, oxyCODONE, polyethylene glycol, traZODone  Assessment/ Plan:  44 y.o. male with ESRD on TTS schedule, admitted for hyperkalemia which was noted when he came for fistulogram .  # End-stage renal disease on TTS Spoke to the patient with the assistance of an interpreter to obtain information and discuss care plan with the patient.  No additional need for dialysis at the moment.  We will plan for dialysis again tomorrow as an outpatient.  # Hyperkalemia  Patient with potassium of 6.9 prior to procedure on 12/02/19  Potassium down to 4.1 today.    Secondary hyperparathyroidism. Phosphorus high at 7.1 on 12/03/19 Repeat phosphorus today  LOS: 2 Azari Hasler 8/25/202110:31 AM

## 2019-12-04 NOTE — Interval H&P Note (Signed)
History and Physical Interval Note:  12/04/2019 11:34 AM  Ralph Lucero  has presented today for surgery, with the diagnosis of ESRD.  The various methods of treatment have been discussed with the patient and family. After consideration of risks, benefits and other options for treatment, the patient has consented to  Procedure(s): A/V Fistulagram (Left) as a surgical intervention.  The patient's history has been reviewed, patient examined, no change in status, stable for surgery.  I have reviewed the patient's chart and labs.  Questions were answered to the patient's satisfaction.     Leotis Pain

## 2019-12-04 NOTE — Telephone Encounter (Signed)
Kim Stegmayer: Ralph Lucero - this patient is be discharged home by medicine today. Can you please schedule him for a permcath insertion early next week with Dew. He will also need a left brachiocephalic AV fistula creation with Dew - not sure when the OR will let us but as soon as possible. He needs a spanish interpreter Me: Okay I can try he doesn't answer his phone at all even when an interpreter is used..... I have only been able to make contact with his dialysis center. Kim Stegmayer: he is going for a second opinion at [facility] as a heads up if he will follow through Me: Aggie Moats, I left a message at scheduling to put him on for Monday 8/30 and covid 8/26. Just tried calling the patient with an interpreter and it went to voicemail. Can't leave a message unfortunately  Hezzie Bump: I appreciate you trying thanks Me: Yw I will contact the dialysis center instead Contacted [facility] and spoke with Anderson Malta and gave her the details of the patient's procedure on 12/09/19 with Dr. Lucky Cowboy and contacted [facility] nurses [facility] and spoke with Leda Gauze as well who stated she will place it on his chart so he will get this information before he is released.

## 2019-12-04 NOTE — Progress Notes (Signed)
Taron Quintero-Sandira A and O x4. VSS. Pt tolerating diet well. No complaints of nausea or vomiting. IV removed intact, prescriptions given. Pt voices understanding of discharge instructions with no further questions. Patient discharged walked down with NT  Allergies as of 12/04/2019      Reactions   Vancomycin       Medication List    TAKE these medications   aspirin 81 MG chewable tablet Chew 81 mg by mouth daily.   atorvastatin 80 MG tablet Commonly known as: LIPITOR Take 1 tablet (80 mg total) by mouth daily at 6 PM.   felodipine 10 MG 24 hr tablet Commonly known as: PLENDIL Take 10 mg by mouth daily.   isosorbide mononitrate 60 MG 24 hr tablet Commonly known as: IMDUR Take 60 mg by mouth daily. What changed: Another medication with the same name was removed. Continue taking this medication, and follow the directions you see here.   lisinopril 5 MG tablet Commonly known as: ZESTRIL Take 1 tablet (5 mg total) by mouth daily.   metoprolol tartrate 50 MG tablet Commonly known as: LOPRESSOR Take 1 tablet (50 mg total) by mouth 2 (two) times daily.   ticagrelor 90 MG Tabs tablet Commonly known as: BRILINTA Take 90 mg by mouth 2 (two) times daily.       Vitals:   12/04/19 1315 12/04/19 1328  BP: 101/74 100/77  Pulse: 62 61  Resp: 14 16  Temp:  97.6 F (36.4 C)  SpO2: 95% 99%    Darnelle Catalan

## 2019-12-04 NOTE — Discharge Summary (Signed)
Physician Discharge Summary  Ralph Lucero QIO:[id number] DOB: Aug 26, 1975 DOA: 12/02/2019  PCP: Anthonette Legato, MD  Admit date: 12/02/2019 Discharge date: 12/04/2019  Admitted From: Home. Disposition: Home  Recommendations for Outpatient Follow-up:  1. Follow ups as below. 2. Please follow up on the following pending results: None  Home Health: None required Equipment/Devices: None required  Discharge Condition: Stable CODE STATUS: Full code  Video interpreter with ID number [id number] used for this encounter.   Follow-up Information    Dew, Erskine Squibb, MD Follow up.   Specialties: Vascular Surgery, Radiology, Interventional Cardiology Why: Our office will reach out to you in regard to scheduling a date and time for your PermCath insertion as well as creation of a brachiocephalic AV fistula at the left upper extremity. Contact information: [place] [address] [phone]                Hospital Course: 44 year old Spanish-speaking male with history of ESRD on HD TTS, HTN and secondary hyperparathyroidism who presented for scheduled left upper extremity fistulogram with possible intervention but found to have hyperkalemia to 6.9.  The procedure was canceled.  Patient was admitted.  Nephrology consulted.  He underwent dialysis on 8/23 via existing fistula.  Hyperkalemia resolved.  Vascular surgery consulted.  Patient underwent fistulogram on 12/04/2019.  However, patient refused permacath placement and revision of the brachiocephalic fistula.  He was discharged to follow-up with vascular surgery outpatient.  See individual problem list below for more on hospital course. Discharge Diagnoses:  ESRD on HD TTS-underwent HD on 8/23 and 8/24 -Continue outpatient HD TTS  Poorly functional and markedly aneurysmal LUE brachiocephalic fistula -Underwent:  1.   Left radiocephalic arteriovenous fistula cannulation under ultrasound guidance 2.   Left arm  fistulagram including central venogram 3.   Percutaneous transluminal angioplasty of proximal forearm cephalic vein with 6 and 8 mm diameter balloons -Outpatient follow-up with vascular surgery  Essential hypertension: Normotensive. -Continue home medications  Hyperkalemia: Resolved  Hyperlipidemia: Continue statins  Language barrier affecting communication -Video interpreter utilized for this encounter.  Body mass index is 18.79 kg/m.            Discharge Exam: Vitals:   12/04/19 1245 12/04/19 1300  BP: 97/76 109/79  Pulse: 67 62  Resp:  12  Temp:    SpO2: 95% 96%    GENERAL: No apparent distress.  Nontoxic. HEENT: MMM.  Vision and hearing grossly intact.  NECK: Supple.  No apparent JVD.  RESP:  No IWOB.  Fair aeration bilaterally. CVS:  RRR. Heart sounds normal.  ABD/GI/GU: Bowel sounds present. Soft. Non tender.  MSK/EXT:  Moves extremities.  LUE fistula with aneurysmal enlargement but good bruits. SKIN: no apparent skin lesion or wound NEURO: Awake, alert and oriented appropriately.  No apparent focal neuro deficit. PSYCH: Calm. Normal affect.   Discharge Instructions  Discharge Instructions    Call MD for:  difficulty breathing, headache or visual disturbances   Complete by: As directed    Call MD for:  extreme fatigue   Complete by: As directed    Call MD for:  persistant dizziness or light-headedness   Complete by: As directed    Call MD for:  persistant nausea and vomiting   Complete by: As directed    Call MD for:  severe uncontrolled pain   Complete by: As directed    Call MD for:  temperature >100.4   Complete by: As directed    Diet - low sodium heart healthy  Complete by: As directed    With fluid restriction to less than 1200 cc a day   Discharge instructions   Complete by: As directed    It has been a pleasure taking care of you!  You were hospitalized for evaluation of you fistula.  Since you refused catheter placement for dialysis  and revision of the fistula discharging you to follow-up with your nephrologist and vascular surgeon.    Please go to your hospital follow-up appointments or call to schedule as recommended.   Take care,   Increase activity slowly   Complete by: As directed      Allergies as of 12/04/2019      Reactions   Vancomycin       Medication List    TAKE these medications   aspirin 81 MG chewable tablet Chew 81 mg by mouth daily.   atorvastatin 80 MG tablet Commonly known as: LIPITOR Take 1 tablet (80 mg total) by mouth daily at 6 PM.   felodipine 10 MG 24 hr tablet Commonly known as: PLENDIL Take 10 mg by mouth daily.   isosorbide mononitrate 60 MG 24 hr tablet Commonly known as: IMDUR Take 60 mg by mouth daily. What changed: Another medication with the same name was removed. Continue taking this medication, and follow the directions you see here.   lisinopril 5 MG tablet Commonly known as: ZESTRIL Take 1 tablet (5 mg total) by mouth daily.   metoprolol tartrate 50 MG tablet Commonly known as: LOPRESSOR Take 1 tablet (50 mg total) by mouth 2 (two) times daily.   ticagrelor 90 MG Tabs tablet Commonly known as: BRILINTA Take 90 mg by mouth 2 (two) times daily.       Consultations:  Vascular surgery  Nephrology  Procedures/Studies:  Fistulogram of LUE brachiocephalic fistula    No results found.     The results of significant diagnostics from this hospitalization (including imaging, microbiology, ancillary and laboratory) are listed below for reference.     Microbiology: Recent Results (from the past 240 hour(s))  SARS CORONAVIRUS 2 (TAT 6-24 HRS) Nasopharyngeal Nasopharyngeal Swab     Status: None   Collection Time: 11/29/19 11:15 AM   Specimen: Nasopharyngeal Swab  Result Value Ref Range Status   SARS Coronavirus 2 NEGATIVE NEGATIVE Final    Comment: (NOTE) SARS-CoV-2 target nucleic acids are NOT DETECTED.  The SARS-CoV-2 RNA is generally  detectable in upper and lower respiratory specimens during the acute phase of infection. Negative results do not preclude SARS-CoV-2 infection, do not rule out co-infections with other pathogens, and should not be used as the sole basis for treatment or other patient management decisions. Negative results must be combined with clinical observations, patient history, and epidemiological information. The expected result is Negative.  Fact Sheet for Patients: SugarRoll.be  Fact Sheet for Healthcare Providers: https://www.woods-mathews.com/  This test is not yet approved or cleared by the [place] FDA and  has been authorized for detection and/or diagnosis of SARS-CoV-2 by FDA under an Emergency Use Authorization (EUA). This EUA will remain  in effect (meaning this test can be used) for the duration of the COVID-19 declaration under Se ction 564(b)(1) of the Act, 21 U.S.C. section 360bbb-3(b)(1), unless the authorization is terminated or revoked sooner.  Performed at [facility], [place] [address], [place], [place] [address]      Labs: BNP (last 3 results) Recent Labs    12/17/18 2348  BNP 3,382.5*   Basic Metabolic Panel: Recent Labs  Lab 12/02/19 1202 12/02/19 1605 12/02/19 1809 12/03/19 0428 12/03/19 1349 12/04/19 0455  NA 137  --  137 137  --  138  K 6.9*  --  3.2* 4.6  --  4.1  CL 97*  --  92* 91*  --  92*  CO2 20*  --  29 29  --  29  GLUCOSE 83  --  77 77  --  82  BUN 104*  --  28* 52*  --  51*  CREATININE 16.39*  --  5.11* 10.39*  --  10.13*  CALCIUM 7.4*  --  8.3* 7.5*  --  7.3*  PHOS  --  4.0  --   --  7.1*  --    Liver Function Tests: No results for input(s): AST, ALT, ALKPHOS, BILITOT, PROT, ALBUMIN in the last 168 hours. No results for input(s): LIPASE, AMYLASE in the last 168 hours. No results for input(s): AMMONIA in the last 168 hours. CBC: Recent Labs  Lab 12/02/19 1202 12/03/19 0428  12/04/19 0455  WBC 4.8 5.7 4.9  HGB 14.3 15.3 16.9  HCT 42.6 46.1 49.0  MCV 95.3 95.1 92.8  PLT 127* 121* 145*   Cardiac Enzymes: No results for input(s): CKTOTAL, CKMB, CKMBINDEX, TROPONINI in the last 168 hours. BNP: Invalid input(s): POCBNP CBG: Recent Labs  Lab 12/02/19 1740  GLUCAP 82   D-Dimer No results for input(s): DDIMER in the last 72 hours. Hgb A1c No results for input(s): HGBA1C in the last 72 hours. Lipid Profile No results for input(s): CHOL, HDL, LDLCALC, TRIG, CHOLHDL, LDLDIRECT in the last 72 hours. Thyroid function studies No results for input(s): TSH, T4TOTAL, T3FREE, THYROIDAB in the last 72 hours.  Invalid input(s): FREET3 Anemia work up No results for input(s): VITAMINB12, FOLATE, FERRITIN, TIBC, IRON, RETICCTPCT in the last 72 hours. Urinalysis No results found for: COLORURINE, APPEARANCEUR, LABSPEC, PHURINE, GLUCOSEU, HGBUR, BILIRUBINUR, KETONESUR, PROTEINUR, UROBILINOGEN, NITRITE, LEUKOCYTESUR Sepsis Labs Invalid input(s): PROCALCITONIN,  WBC,  LACTICIDVEN   Time coordinating discharge: 35 minutes  SIGNED:  Mercy Riding, MD  [facility] 12/04/2019, 1:16 PM  If 7PM-7AM, please contact night-coverage [email or web address]

## 2019-12-04 NOTE — Clinical Social Work Note (Signed)
Patient has orders to discharge home today. Not discharging on any new medications. Per RN, no other needs identified.  CSW signing off.  Dayton Scrape, [place]

## 2019-12-04 NOTE — Op Note (Signed)
[facility]    OPERATIVE NOTE   PROCEDURE: 1.   Left radiocephalic arteriovenous fistula cannulation under ultrasound guidance 2.   Left arm fistulagram including central venogram 3.   Percutaneous transluminal angioplasty of proximal forearm cephalic vein with 6 and 8 mm diameter balloons  PRE-OPERATIVE DIAGNOSIS: 1. ESRD 2. Poorly functional markedly aneurysmal left radiocephalic AVF  POST-OPERATIVE DIAGNOSIS: same as above   SURGEON: Leotis Pain, MD  ANESTHESIA: local with MCS  ESTIMATED BLOOD LOSS: 3 cc  FINDING(S): 1. Near occlusive stenosis of greater than 90% in the proximal forearm cephalic vein just proximal to the access sites which were markedly aneurysmal.  There was dual outflow in the upper arm through both the basilic vein of the cephalic vein.  No central venous lesions.  SPECIMEN(S):  None  CONTRAST: 30 cc  FLUORO TIME: 2 minutes  MODERATE CONSCIOUS SEDATION TIME: Approximately 20 minutes with 4 mg of Versed and 100 mcg of Fentanyl   INDICATIONS: Ralph Lucero is a 44 y.o. male who presents with malfunctioning markedly aneurysmal left radiocephalic arteriovenous fistula.  The patient is scheduled for left arm fistulagram.  The patient is aware the risks include but are not limited to: bleeding, infection, thrombosis of the cannulated access, and possible anaphylactic reaction to the contrast.  The patient is aware of the risks of the procedure and elects to proceed forward.  DESCRIPTION: After full informed written consent was obtained, the patient was brought back to the angiography suite and placed supine upon the angiography table.  The patient was connected to monitoring equipment. Moderate conscious sedation was administered with a face to face encounter with the patient throughout the procedure with my supervision of the RN administering medicines and monitoring the patient's vital signs and mental status throughout from the  start of the procedure until the patient was taken to the recovery room. The left arm was prepped and draped in the standard fashion for a percutaneous access intervention.  Under ultrasound guidance, the left radiocephalic arteriovenous fistula was cannulated with a micropuncture needle under direct ultrasound guidance where it was patent and a permanent image was performed.  The microwire was advanced into the fistula and the needle was exchanged for the a microsheath.  I then upsized to a 6 Fr Sheath and imaging was performed.  Hand injections were completed to image the access including the central venous system. This demonstrated a near occlusive stenosis of greater than 90% in the proximal forearm cephalic vein just proximal to the access sites which were markedly aneurysmal.  There was dual outflow in the upper arm through both the basilic vein of the cephalic vein.  No central venous lesions. I then gave the patient 3000 units of intravenous heparin.  I then crossed the stenosis with a Magic Tourqe wire.  Based on the imaging, a 6 mm x 6 cm  angioplasty balloon was selected.  The balloon was centered around the proximal forearm cephalic vein stenosis and inflated to 12 ATM for 1 minute(s).  This was somewhat undersized so upsized to an 8 mm diameter by 8 cm length angioplasty balloon inflating this to 10 atm for 1 minute the same location.  On completion imaging, a 40-50% residual stenosis was present but the biggest issue were the markedly aneurysmal access sites with skin breakdown.  The patient will need a new fistula ultimately as this fistula is likely not salvageable at this point.  The patient has refused PermCath today so none was placed.  Based on the completion imaging, no further intervention is necessary.  The wire and balloon were removed from the sheath.  A 4-0 Monocryl purse-string suture was sewn around the sheath.  The sheath was removed while tying down the suture.  A sterile bandage  was applied to the puncture site.  COMPLICATIONS: None  CONDITION: Stable   Leotis Pain  12/04/2019 12:39 PM   This note was created with Dragon Medical transcription system. Any errors in dictation are purely unintentional.

## 2019-12-05 ENCOUNTER — Other Ambulatory Visit: Admission: RE | Admit: 2019-12-05 | Payer: MEDICAID | Source: Ambulatory Visit

## 2019-12-05 NOTE — Telephone Encounter (Signed)
Spoke with Dawn at [facility] regarding the patient and was told to cancel his procedure with Dr. Lucky Cowboy on 12/09/19 because the patient is refusing to go to [facility] to have his procedure stating the staff is rude. Patient has been canceled.

## 2019-12-09 ENCOUNTER — Ambulatory Visit: Admission: RE | Admit: 2019-12-09 | Payer: Self-pay | Source: Home / Self Care | Admitting: Vascular Surgery

## 2019-12-09 ENCOUNTER — Encounter: Admission: RE | Payer: Self-pay | Source: Home / Self Care

## 2019-12-09 SURGERY — DIALYSIS/PERMA CATHETER INSERTION
Anesthesia: Moderate Sedation | Laterality: Left

## 2020-03-30 ENCOUNTER — Telehealth (INDEPENDENT_AMBULATORY_CARE_PROVIDER_SITE_OTHER): Payer: Self-pay

## 2020-03-30 NOTE — Telephone Encounter (Signed)
A fax was received from [facility] regarding this patient being unable to cannulate through his fistula and he does not have a permcath. Patient was called and stated he wanted to go to his doctor in [place]. I called the dialysis center and let them know that we had contacted the patient and what was said. Dawn stated she knew the patient preferred [facility] and sent the fax to [place] as well. Per Dawn she will be reaching out to [facility] as this is where the patient is requesting to go.

## 2020-04-09 ENCOUNTER — Encounter (INDEPENDENT_AMBULATORY_CARE_PROVIDER_SITE_OTHER): Payer: Self-pay

## 2020-04-09 ENCOUNTER — Ambulatory Visit (INDEPENDENT_AMBULATORY_CARE_PROVIDER_SITE_OTHER): Payer: Self-pay | Admitting: Nurse Practitioner

## 2020-05-19 IMAGING — DX DG CHEST 1V PORT
1 series · 1 of 1 positions shown · non-contrast
Comparison: Chest x-ray 12/18/2018.

CLINICAL DATA: 43-year-old male with history of shortness of
breath.

EXAM:
PORTABLE CHEST 1 VIEW

[chest ap]
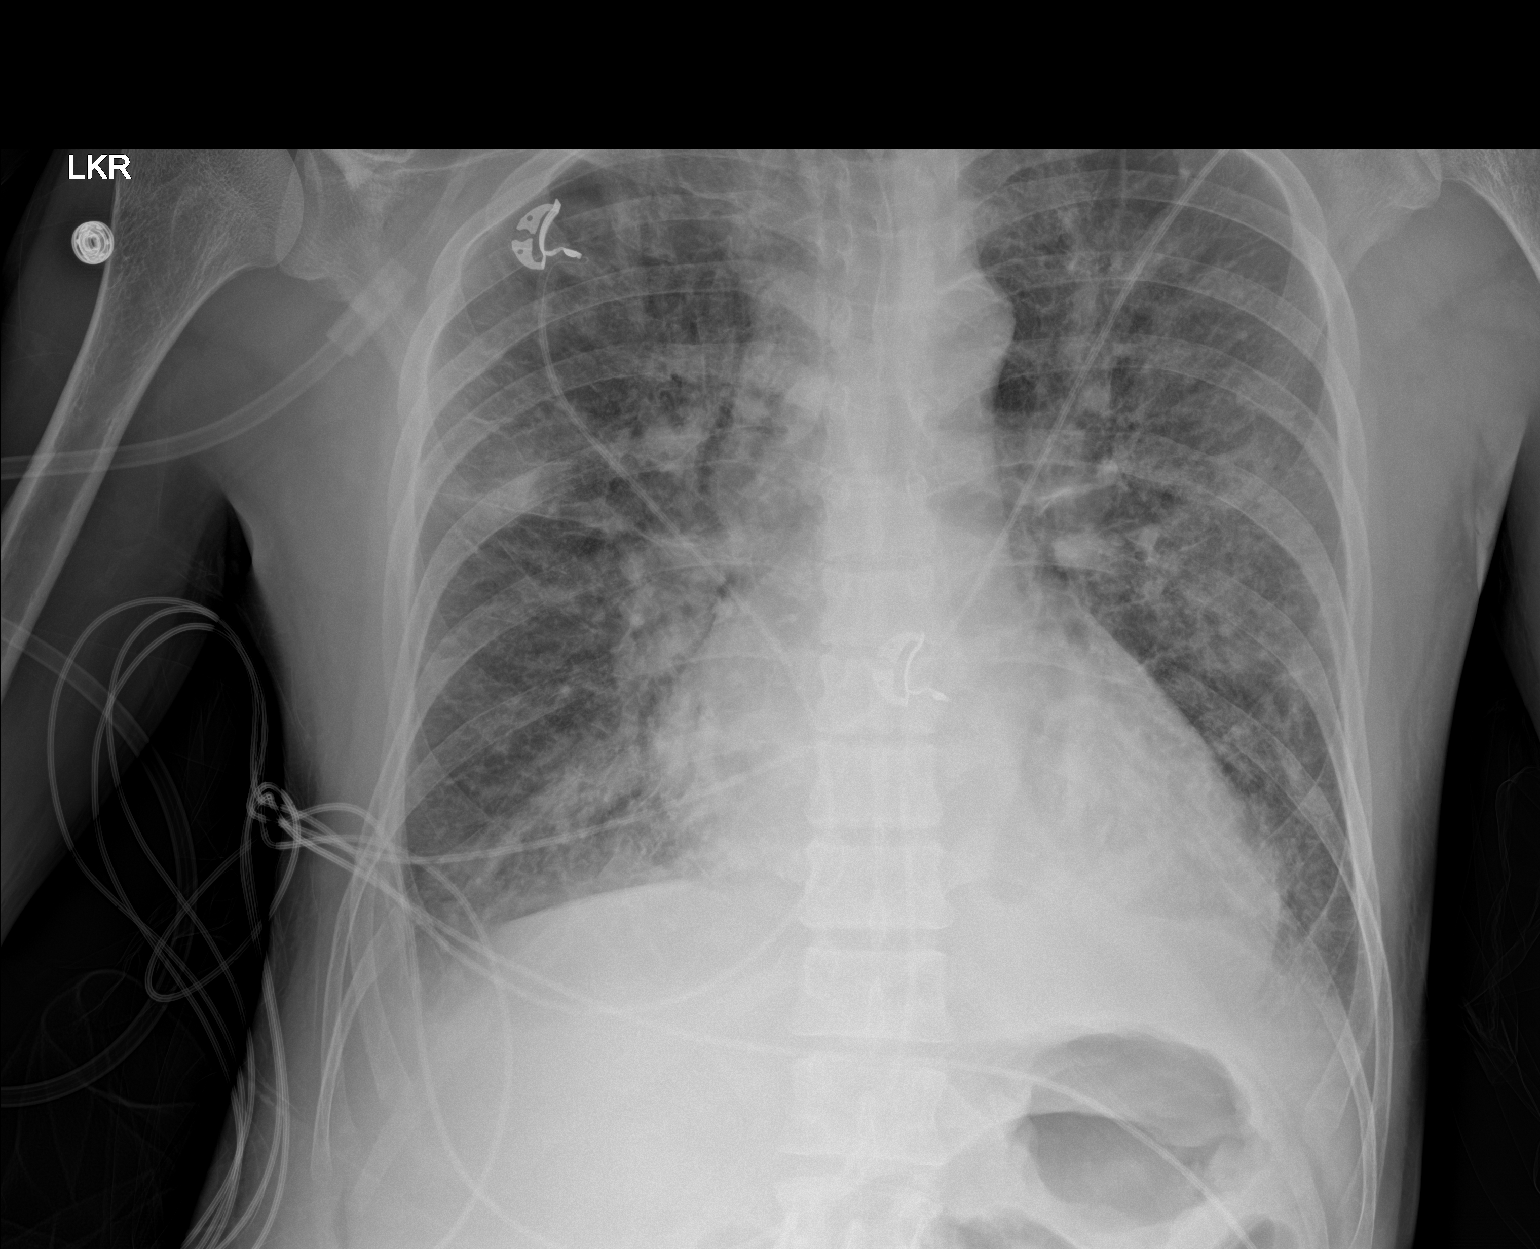

[1 of 1 positions shown; findings below may reference images not displayed]

FINDINGS: There is cephalization of the pulmonary vasculature, indistinctness
of the interstitial markings, and patchy airspace disease throughout
the lungs bilaterally suggestive of moderate pulmonary edema. No
pneumothorax. Small bilateral pleural effusions. Mild cardiomegaly.
Upper mediastinal contours are within normal limits.
IMPRESSION: 1. The appearance the chest suggests mildly worsened congestive
heart failure, as above.
